# Patient Record
Sex: Male | Born: 1950 | Race: White | Hispanic: No | Smoking: Current every day smoker
Health system: Southern US, Community
[De-identification: ages and names within clinical notes are randomized; demographics above are authoritative.]

## PROBLEM LIST (undated history)

## (undated) DIAGNOSIS — R7881 Bacteremia: Secondary | ICD-10-CM

## (undated) DIAGNOSIS — G9340 Encephalopathy, unspecified: Secondary | ICD-10-CM

## (undated) DIAGNOSIS — Z720 Tobacco use: Secondary | ICD-10-CM

## (undated) DIAGNOSIS — S069XAA Unspecified intracranial injury with loss of consciousness status unknown, initial encounter: Secondary | ICD-10-CM

## (undated) DIAGNOSIS — S069X9A Unspecified intracranial injury with loss of consciousness of unspecified duration, initial encounter: Secondary | ICD-10-CM

## (undated) DIAGNOSIS — Z8709 Personal history of other diseases of the respiratory system: Secondary | ICD-10-CM

## (undated) DIAGNOSIS — E876 Hypokalemia: Secondary | ICD-10-CM

## (undated) DIAGNOSIS — D649 Anemia, unspecified: Secondary | ICD-10-CM

## (undated) DIAGNOSIS — F1010 Alcohol abuse, uncomplicated: Secondary | ICD-10-CM

## (undated) DIAGNOSIS — I10 Essential (primary) hypertension: Secondary | ICD-10-CM

## (undated) DIAGNOSIS — R55 Syncope and collapse: Secondary | ICD-10-CM

## (undated) HISTORY — PX: FRACTURE SURGERY: SHX138

## (undated) HISTORY — DX: Syncope and collapse: R55

## (undated) HISTORY — DX: Encephalopathy, unspecified: G93.40

## (undated) HISTORY — PX: APPENDECTOMY: SHX54

## (undated) HISTORY — DX: Anemia, unspecified: D64.9

## (undated) HISTORY — DX: Alcohol abuse, uncomplicated: F10.10

## (undated) HISTORY — DX: Tobacco use: Z72.0

## (undated) HISTORY — DX: Personal history of other diseases of the respiratory system: Z87.09

## (undated) HISTORY — DX: Hypokalemia: E87.6

---

## 2012-11-23 ENCOUNTER — Other Ambulatory Visit: Payer: Self-pay | Admitting: Gastroenterology

## 2012-11-23 ENCOUNTER — Ambulatory Visit
Admission: RE | Admit: 2012-11-23 | Discharge: 2012-11-23 | Disposition: A | Discharge status: Home / Self Care | Payer: Self-pay | Source: Ambulatory Visit | Attending: Gastroenterology | Admitting: Gastroenterology

## 2012-11-23 DIAGNOSIS — R109 Unspecified abdominal pain: Secondary | ICD-10-CM

## 2012-11-23 DIAGNOSIS — R112 Nausea with vomiting, unspecified: Secondary | ICD-10-CM

## 2012-11-23 MED ORDER — IOHEXOL 300 MG/ML  SOLN
125.0000 mL | Freq: Once | INTRAMUSCULAR | Status: AC | PRN
  Administered 2012-11-23: 125 mL via INTRAVENOUS

## 2013-01-13 ENCOUNTER — Encounter (HOSPITAL_COMMUNITY): Payer: Self-pay | Admitting: *Deleted

## 2013-01-13 ENCOUNTER — Emergency Department (HOSPITAL_COMMUNITY)
Admission: EM | Admit: 2013-01-13 | Discharge: 2013-01-13 | Disposition: A | Discharge status: Home / Self Care | Payer: Managed Care, Other (non HMO) | Attending: Emergency Medicine | Admitting: Emergency Medicine

## 2013-01-13 DIAGNOSIS — E876 Hypokalemia: Secondary | ICD-10-CM | POA: Insufficient documentation

## 2013-01-13 DIAGNOSIS — Z87820 Personal history of traumatic brain injury: Secondary | ICD-10-CM | POA: Insufficient documentation

## 2013-01-13 DIAGNOSIS — R61 Generalized hyperhidrosis: Secondary | ICD-10-CM | POA: Insufficient documentation

## 2013-01-13 DIAGNOSIS — R110 Nausea: Secondary | ICD-10-CM | POA: Insufficient documentation

## 2013-01-13 DIAGNOSIS — F17200 Nicotine dependence, unspecified, uncomplicated: Secondary | ICD-10-CM | POA: Insufficient documentation

## 2013-01-13 DIAGNOSIS — I10 Essential (primary) hypertension: Secondary | ICD-10-CM | POA: Insufficient documentation

## 2013-01-13 DIAGNOSIS — E860 Dehydration: Secondary | ICD-10-CM | POA: Insufficient documentation

## 2013-01-13 DIAGNOSIS — R55 Syncope and collapse: Secondary | ICD-10-CM | POA: Insufficient documentation

## 2013-01-13 DIAGNOSIS — Z880 Allergy status to penicillin: Secondary | ICD-10-CM | POA: Insufficient documentation

## 2013-01-13 HISTORY — DX: Essential (primary) hypertension: I10

## 2013-01-13 HISTORY — DX: Unspecified intracranial injury with loss of consciousness status unknown, initial encounter: S06.9XAA

## 2013-01-13 HISTORY — DX: Unspecified intracranial injury with loss of consciousness of unspecified duration, initial encounter: S06.9X9A

## 2013-01-13 LAB — COMPREHENSIVE METABOLIC PANEL
ALT: 10 U/L (ref 0–53)
AST: 15 U/L (ref 0–37)
Albumin: 4.1 g/dL (ref 3.5–5.2)
Calcium: 8.9 mg/dL (ref 8.4–10.5)
Sodium: 133 mEq/L — ABNORMAL LOW (ref 135–145)
Total Protein: 7.1 g/dL (ref 6.0–8.3)

## 2013-01-13 LAB — CBC WITH DIFFERENTIAL/PLATELET
Basophils Absolute: 0 10*3/uL (ref 0.0–0.1)
Basophils Relative: 0 % (ref 0–1)
Eosinophils Absolute: 0.1 10*3/uL (ref 0.0–0.7)
Eosinophils Relative: 1 % (ref 0–5)
Lymphocytes Relative: 15 % (ref 12–46)
MCV: 87.3 fL (ref 78.0–100.0)
Platelets: 228 10*3/uL (ref 150–400)
RDW: 12 % (ref 11.5–15.5)
WBC: 10.4 10*3/uL (ref 4.0–10.5)

## 2013-01-13 LAB — URINALYSIS, ROUTINE W REFLEX MICROSCOPIC
Bilirubin Urine: NEGATIVE
Hgb urine dipstick: NEGATIVE
Specific Gravity, Urine: 1.006 (ref 1.005–1.030)
pH: 7 (ref 5.0–8.0)

## 2013-01-13 LAB — MAGNESIUM: Magnesium: 1.7 mg/dL (ref 1.5–2.5)

## 2013-01-13 MED ORDER — SODIUM CHLORIDE 0.9 % IV BOLUS (SEPSIS)
1000.0000 mL | Freq: Once | INTRAVENOUS | Status: AC
  Administered 2013-01-13: 1000 mL via INTRAVENOUS

## 2013-01-13 MED ORDER — POTASSIUM CHLORIDE 10 MEQ/100ML IV SOLN
10.0000 meq | INTRAVENOUS | Status: AC
  Administered 2013-01-13 (×2): 10 meq via INTRAVENOUS
  Filled 2013-01-13 (×2): qty 100

## 2013-01-13 MED ORDER — PROMETHAZINE HCL 25 MG PO TABS: 25.0000 mg | ORAL_TABLET | Freq: Four times a day (QID) | ORAL | Status: DC | PRN

## 2013-01-13 MED ORDER — POTASSIUM CHLORIDE CRYS ER 20 MEQ PO TBCR
60.0000 meq | EXTENDED_RELEASE_TABLET | Freq: Once | ORAL | Status: AC
  Administered 2013-01-13: 60 meq via ORAL
  Filled 2013-01-13: qty 3

## 2013-01-13 NOTE — ED Notes (Signed)
Per EMS report pt was in line  in [employer] when he felt lightheaded. 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

## 2013-01-13 NOTE — ED Notes (Signed)
UEA:[id number]> Expected date:<BR> Expected time:<BR> Means of arrival:<BR> Comments:<BR> 62 y/o M near syncopal

## 2013-01-13 NOTE — ED Provider Notes (Signed)
History     CSN: [id number]  Arrival date & time 01/13/13  1330   First MD Initiated Contact with Patient 01/13/13 1342      Chief Complaint  Patient presents with  . 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  He states that this was acute in onset, intermittent, has never happened before and was associated with diaphoresis, nausea and "going pale" and resolved spontaneously - family member reports he was near syncopal, didn't pass out but had acute altered MS that was short lived and had no seizure activity or urinary incontinence.    He denies CP, SOB, headache, seizures.  The history is provided by the patient.    Past Medical History  Diagnosis Date  . TBI (traumatic brain injury)   . Hypertension     Past Surgical History  Procedure Laterality Date  . Fracture surgery    . Appendectomy      History reviewed. No pertinent family history.  History  Substance Use Topics  . Smoking status: Current Every Day Smoker -- 2.00 packs/day  . Smokeless tobacco: Not on file  . Alcohol Use: Yes      Review of Systems  All other systems reviewed and are negative.    Allergies  Effexor and Amoxicillin  Home Medications  No current outpatient prescriptions on file.  BP 131/87  Pulse 82  Temp(Src) 98.3 F (36.8 C) (Oral)  Resp 20  SpO2 96%  Physical Exam  Nursing note and vitals reviewed. Constitutional: He appears well-developed and well-nourished. No distress.  HENT:  Head: Normocephalic and atraumatic.  Mouth/Throat: No oropharyngeal exudate.  MM dry  Eyes: Conjunctivae and EOM are normal. Pupils are equal, round, and reactive to light. Right eye exhibits no discharge. Left eye exhibits no discharge. No scleral icterus.   Neck: Normal range of motion. Neck supple. No JVD present. No thyromegaly present.  Cardiovascular: Normal rate, regular rhythm, normal heart sounds and intact distal pulses.  Exam reveals no gallop and no friction rub.   No murmur heard. Pulmonary/Chest: Effort normal and breath sounds normal. No respiratory distress. He has no wheezes. He has no rales.  Abdominal: Soft. Bowel sounds are normal. He exhibits no distension and no mass. There is no tenderness.  Musculoskeletal: Normal range of motion. He exhibits no edema and no tenderness.  Lymphadenopathy:    He has no cervical adenopathy.  Neurological: He is alert. Coordination normal.  Skin: Skin is warm and dry. No rash noted. No erythema.  Psychiatric: He has a normal mood and affect. His behavior is normal.    ED Course  Procedures (including critical care time)  Labs Reviewed  GLUCOSE, CAPILLARY - Abnormal; Notable for the following:    Glucose-Capillary 123 (*)    All other components within normal limits  CBC WITH DIFFERENTIAL  COMPREHENSIVE METABOLIC PANEL  URINALYSIS, ROUTINE W REFLEX MICROSCOPIC  MAGNESIUM   No results found.   No diagnosis found.    MDM  No focal findings to suggest cardiac or neurologic source of pt's near syncope - he has no heart disease and has not been hypotensive or febrile.  ECG without acute findings.  ED ECG REPORT  I personally interpreted this EKG   Date: 01/13/2013   Rate: 78  Rhythm: normal sinus rhythm  QRS Axis: left  Intervals: normal  ST/T Wave abnormalities: nonspecific T wave changes  Conduction Disutrbances:nonspecific intraventricular conduction delay  Narrative Interpretation:   Old EKG Reviewed: none available     Cahnge of shift - care signed out to Dr. Renold Genta, MD 01/13/13 [phone]

## 2013-01-13 NOTE — ED Notes (Signed)
Pt again reminded we need urine sample, pt is stating that he will try in a little bit; pt refused in and out cath

## 2013-01-13 NOTE — ED Notes (Signed)
Pt's sister reports pt has hx of 2 TBI that have left pt with decreased cognition. Sister reports pt lives in [place] independent living. Per sister pt had frequent falls and hospital admissions in the past due to same.

## 2017-01-13 DIAGNOSIS — R7881 Bacteremia: Secondary | ICD-10-CM

## 2017-01-13 HISTORY — DX: Bacteremia: R78.81

## 2017-01-19 ENCOUNTER — Encounter: Payer: Self-pay | Admitting: Emergency Medicine

## 2017-01-19 ENCOUNTER — Inpatient Hospital Stay
Admission: EM | Admit: 2017-01-19 | Discharge: 2017-01-24 | DRG: 871 | Disposition: A | Discharge status: Skilled Nursing Facility | Payer: Medicare Other | Attending: Internal Medicine | Admitting: Internal Medicine

## 2017-01-19 ENCOUNTER — Emergency Department: Payer: Medicare Other

## 2017-01-19 DIAGNOSIS — Z79899 Other long term (current) drug therapy: Secondary | ICD-10-CM | POA: Diagnosis not present

## 2017-01-19 DIAGNOSIS — F329 Major depressive disorder, single episode, unspecified: Secondary | ICD-10-CM | POA: Diagnosis present

## 2017-01-19 DIAGNOSIS — R197 Diarrhea, unspecified: Secondary | ICD-10-CM | POA: Diagnosis present

## 2017-01-19 DIAGNOSIS — G9341 Metabolic encephalopathy: Secondary | ICD-10-CM | POA: Diagnosis present

## 2017-01-19 DIAGNOSIS — I248 Other forms of acute ischemic heart disease: Secondary | ICD-10-CM | POA: Diagnosis present

## 2017-01-19 DIAGNOSIS — I10 Essential (primary) hypertension: Secondary | ICD-10-CM | POA: Diagnosis present

## 2017-01-19 DIAGNOSIS — N179 Acute kidney failure, unspecified: Secondary | ICD-10-CM

## 2017-01-19 DIAGNOSIS — A419 Sepsis, unspecified organism: Principal | ICD-10-CM | POA: Diagnosis present

## 2017-01-19 DIAGNOSIS — F17210 Nicotine dependence, cigarettes, uncomplicated: Secondary | ICD-10-CM | POA: Diagnosis present

## 2017-01-19 DIAGNOSIS — R4182 Altered mental status, unspecified: Secondary | ICD-10-CM

## 2017-01-19 DIAGNOSIS — E876 Hypokalemia: Secondary | ICD-10-CM | POA: Diagnosis present

## 2017-01-19 DIAGNOSIS — J180 Bronchopneumonia, unspecified organism: Secondary | ICD-10-CM | POA: Diagnosis present

## 2017-01-19 DIAGNOSIS — J9601 Acute respiratory failure with hypoxia: Secondary | ICD-10-CM | POA: Diagnosis present

## 2017-01-19 DIAGNOSIS — R109 Unspecified abdominal pain: Secondary | ICD-10-CM

## 2017-01-19 DIAGNOSIS — G9340 Encephalopathy, unspecified: Secondary | ICD-10-CM | POA: Diagnosis present

## 2017-01-19 DIAGNOSIS — Z87820 Personal history of traumatic brain injury: Secondary | ICD-10-CM | POA: Diagnosis not present

## 2017-01-19 DIAGNOSIS — K219 Gastro-esophageal reflux disease without esophagitis: Secondary | ICD-10-CM | POA: Diagnosis present

## 2017-01-19 DIAGNOSIS — E860 Dehydration: Secondary | ICD-10-CM | POA: Diagnosis present

## 2017-01-19 DIAGNOSIS — R6520 Severe sepsis without septic shock: Secondary | ICD-10-CM | POA: Diagnosis present

## 2017-01-19 DIAGNOSIS — E872 Acidosis: Secondary | ICD-10-CM | POA: Diagnosis present

## 2017-01-19 DIAGNOSIS — K8020 Calculus of gallbladder without cholecystitis without obstruction: Secondary | ICD-10-CM | POA: Diagnosis present

## 2017-01-19 DIAGNOSIS — M109 Gout, unspecified: Secondary | ICD-10-CM | POA: Diagnosis present

## 2017-01-19 DIAGNOSIS — R7989 Other specified abnormal findings of blood chemistry: Secondary | ICD-10-CM

## 2017-01-19 DIAGNOSIS — I2489 Other forms of acute ischemic heart disease: Secondary | ICD-10-CM

## 2017-01-19 LAB — CBC WITH DIFFERENTIAL/PLATELET
BASOS ABS: 0 10*3/uL (ref 0–0.1)
Basophils Relative: 0 %
Eosinophils Absolute: 0 10*3/uL (ref 0–0.7)
Eosinophils Relative: 0 %
HEMATOCRIT: 49.1 % (ref 40.0–52.0)
HEMOGLOBIN: 16.8 g/dL (ref 13.0–18.0)
LYMPHS PCT: 4 %
Lymphs Abs: 0.8 10*3/uL — ABNORMAL LOW (ref 1.0–3.6)
MCH: 31.6 pg (ref 26.0–34.0)
MCHC: 34.2 g/dL (ref 32.0–36.0)
MCV: 92.6 fL (ref 80.0–100.0)
Monocytes Absolute: 1.8 10*3/uL — ABNORMAL HIGH (ref 0.2–1.0)
Monocytes Relative: 8 %
NEUTROS ABS: 21.3 10*3/uL — AB (ref 1.4–6.5)
NEUTROS PCT: 88 %
PLATELETS: 264 10*3/uL (ref 150–440)
RBC: 5.3 MIL/uL (ref 4.40–5.90)
RDW: 13.3 % (ref 11.5–14.5)
WBC: 24 10*3/uL — AB (ref 3.8–10.6)

## 2017-01-19 LAB — COMPREHENSIVE METABOLIC PANEL
ALBUMIN: 4.6 g/dL (ref 3.5–5.0)
ALT: 25 U/L (ref 17–63)
ANION GAP: 19 — AB (ref 5–15)
AST: 126 U/L — ABNORMAL HIGH (ref 15–41)
Alkaline Phosphatase: 88 U/L (ref 38–126)
BUN: 28 mg/dL — ABNORMAL HIGH (ref 6–20)
CHLORIDE: 98 mmol/L — AB (ref 101–111)
CO2: 23 mmol/L (ref 22–32)
Calcium: 9.5 mg/dL (ref 8.9–10.3)
Creatinine, Ser: 3.63 mg/dL — ABNORMAL HIGH (ref 0.61–1.24)
GFR calc non Af Amer: 16 mL/min — ABNORMAL LOW (ref >60)
GFR, EST AFRICAN AMERICAN: 18 mL/min — AB (ref >60)
GLUCOSE: 179 mg/dL — AB (ref 65–99)
Potassium: 2.6 mmol/L — CL (ref 3.5–5.1)
SODIUM: 140 mmol/L (ref 135–145)
Total Bilirubin: 1.8 mg/dL — ABNORMAL HIGH (ref 0.3–1.2)
Total Protein: 8.2 g/dL — ABNORMAL HIGH (ref 6.5–8.1)

## 2017-01-19 LAB — URINALYSIS, ROUTINE W REFLEX MICROSCOPIC
Bacteria, UA: NONE SEEN
GLUCOSE, UA: NEGATIVE mg/dL
Ketones, ur: 5 mg/dL — AB
Leukocytes, UA: NEGATIVE
NITRITE: NEGATIVE
Protein, ur: 100 mg/dL — AB
SPECIFIC GRAVITY, URINE: 1.03 (ref 1.005–1.030)
pH: 5 (ref 5.0–8.0)

## 2017-01-19 LAB — BLOOD GAS, ARTERIAL
Acid-base deficit: 2 mmol/L (ref 0.0–2.0)
Bicarbonate: 22.3 mmol/L (ref 20.0–28.0)
FIO2: 1
O2 Saturation: 98.1 %
Patient temperature: 37
pCO2 arterial: 36 mmHg (ref 32.0–48.0)
pH, Arterial: 7.4 (ref 7.350–7.450)
pO2, Arterial: 106 mmHg (ref 83.0–108.0)

## 2017-01-19 LAB — MRSA PCR SCREENING: MRSA by PCR: NEGATIVE

## 2017-01-19 LAB — LACTIC ACID, PLASMA
LACTIC ACID, VENOUS: 3.5 mmol/L — AB (ref 0.5–1.9)
LACTIC ACID, VENOUS: 1.5 mmol/L (ref 0.5–1.9)
Lactic Acid, Venous: 5.5 mmol/L (ref 0.5–1.9)
Lactic Acid, Venous: 2.4 mmol/L (ref 0.5–1.9)

## 2017-01-19 LAB — AMMONIA: AMMONIA: 18 umol/L (ref 9–35)

## 2017-01-19 LAB — MAGNESIUM: Magnesium: 2.4 mg/dL (ref 1.7–2.4)

## 2017-01-19 LAB — PROTIME-INR
INR: 1.15
Prothrombin Time: 14.8 seconds (ref 11.4–15.2)

## 2017-01-19 LAB — PROCALCITONIN: PROCALCITONIN: 3.27 ng/mL

## 2017-01-19 LAB — TROPONIN I: TROPONIN I: 0.05 ng/mL — AB (ref <0.03)

## 2017-01-19 LAB — BRAIN NATRIURETIC PEPTIDE: B NATRIURETIC PEPTIDE 5: 28 pg/mL (ref 0.0–100.0)

## 2017-01-19 LAB — LIPASE, BLOOD: Lipase: 53 U/L — ABNORMAL HIGH (ref 11–51)

## 2017-01-19 LAB — GLUCOSE, CAPILLARY: GLUCOSE-CAPILLARY: 156 mg/dL — AB (ref 65–99)

## 2017-01-19 LAB — POTASSIUM: Potassium: 2.9 mmol/L — ABNORMAL LOW (ref 3.5–5.1)

## 2017-01-19 MED ORDER — POTASSIUM CHLORIDE 20 MEQ PO PACK
40.0000 meq | PACK | ORAL | Status: AC
  Administered 2017-01-19: 40 meq via ORAL
  Filled 2017-01-19: qty 2

## 2017-01-19 MED ORDER — ONDANSETRON HCL 4 MG/2ML IJ SOLN
4.0000 mg | Freq: Four times a day (QID) | INTRAMUSCULAR | Status: DC | PRN
  Administered 2017-01-20 – 2017-01-23 (×8): 4 mg via INTRAVENOUS
  Filled 2017-01-19 (×10): qty 2

## 2017-01-19 MED ORDER — ACETAMINOPHEN 650 MG RE SUPP: 650.0000 mg | Freq: Four times a day (QID) | RECTAL | Status: DC | PRN

## 2017-01-19 MED ORDER — ORAL CARE MOUTH RINSE
15.0000 mL | Freq: Two times a day (BID) | OROMUCOSAL | Status: DC
  Administered 2017-01-19 – 2017-01-23 (×3): 15 mL via OROMUCOSAL

## 2017-01-19 MED ORDER — DEXTROSE 5 % IV SOLN
2.0000 g | Freq: Once | INTRAVENOUS | Status: AC
  Administered 2017-01-19: 2 g via INTRAVENOUS
  Filled 2017-01-19: qty 2

## 2017-01-19 MED ORDER — POTASSIUM CHLORIDE IN NACL 20-0.9 MEQ/L-% IV SOLN
INTRAVENOUS | Status: AC
  Administered 2017-01-19 – 2017-01-20 (×3): via INTRAVENOUS
  Filled 2017-01-19 (×4): qty 1000

## 2017-01-19 MED ORDER — ENOXAPARIN SODIUM 30 MG/0.3ML ~~LOC~~ SOLN
30.0000 mg | SUBCUTANEOUS | Status: DC
  Administered 2017-01-19: 30 mg via SUBCUTANEOUS
  Filled 2017-01-19 (×2): qty 0.3

## 2017-01-19 MED ORDER — ONDANSETRON HCL 4 MG PO TABS: 4.0000 mg | ORAL_TABLET | Freq: Four times a day (QID) | ORAL | Status: DC | PRN

## 2017-01-19 MED ORDER — DEXTROSE 5 % IV SOLN
1.0000 g | INTRAVENOUS | Status: DC
  Administered 2017-01-20 – 2017-01-21 (×2): 1 g via INTRAVENOUS
  Filled 2017-01-19 (×2): qty 1

## 2017-01-19 MED ORDER — VANCOMYCIN HCL 10 G IV SOLR
1250.0000 mg | INTRAVENOUS | Status: DC
  Administered 2017-01-19: 1250 mg via INTRAVENOUS
  Filled 2017-01-19: qty 1250

## 2017-01-19 MED ORDER — SODIUM CHLORIDE 0.9 % IV BOLUS (SEPSIS)
1000.0000 mL | INTRAVENOUS | Status: AC
  Administered 2017-01-19: 1000 mL via INTRAVENOUS

## 2017-01-19 MED ORDER — ACETAMINOPHEN 325 MG PO TABS
650.0000 mg | ORAL_TABLET | Freq: Four times a day (QID) | ORAL | Status: DC | PRN
  Administered 2017-01-23: 650 mg via ORAL
  Filled 2017-01-19: qty 2

## 2017-01-19 MED ORDER — VANCOMYCIN HCL IN DEXTROSE 1-5 GM/200ML-% IV SOLN
1000.0000 mg | Freq: Once | INTRAVENOUS | Status: AC
  Administered 2017-01-19: 1000 mg via INTRAVENOUS
  Filled 2017-01-19: qty 200

## 2017-01-19 MED ORDER — SODIUM CHLORIDE 0.9 % IV BOLUS (SEPSIS)
2000.0000 mL | Freq: Once | INTRAVENOUS | Status: AC
  Administered 2017-01-19: 2000 mL via INTRAVENOUS

## 2017-01-19 MED ORDER — POTASSIUM CHLORIDE 10 MEQ/100ML IV SOLN
10.0000 meq | INTRAVENOUS | Status: AC
  Administered 2017-01-19 (×3): 10 meq via INTRAVENOUS
  Filled 2017-01-19 (×3): qty 100

## 2017-01-19 NOTE — ED Triage Notes (Signed)
Pt to ED via EMS from [facility], family called ems states pt was diagnosed with hypocalcemia last week and hasn't been taking supplements. Pt was found diaphoretic and tachycardic. Pt has hx of TBIs and per family more altered than normal.

## 2017-01-19 NOTE — Progress Notes (Signed)
Spoke with ED nurse Amy and asked for height and weight to be entered into Epic. She agrees and says "when I get a minute I will." Height and weight are needed to estimate creatinine clearance and for vancomycin dosing.    Kauri Garson A. [place], [place] BCPS Clinical Pharmacist 01/19/2017 12:54

## 2017-01-19 NOTE — ED Notes (Signed)
Pt sleeping, respirations even and non-labored. Pt rouses easily. Daughter at bedside.

## 2017-01-19 NOTE — Progress Notes (Signed)
First cefepime dose tubed to ED at 13:06.

## 2017-01-19 NOTE — ED Provider Notes (Signed)
[facility] Emergency Department Provider Note  ____________________________________________   First MD Initiated Contact with Patient 01/19/17 1249     (approximate)  I have reviewed the triage vital signs and the nursing notes.   HISTORY  Chief Complaint Dehydration  Level 5 caveat:  history/ROS limited by acute/critical illness and chronic TBI  HPI Jacob Villanueva is a 66 y.o. male with a history of traumatic brain injury who lives in the independent living care at [facility].  He presents by EMS for decreased level of responsiveness and general ill appearance.  His sister and niece, who share healthcare powers of attorney, are both present.  They state that the patient claims he has been feeling sick all week but without any specific symptoms, and as a result of his TBI and is very difficult to get a history from him.  His symptoms seem to have gradually worsened and then become much more severe over the last 24 hours.  This morning he did not show up for morning coffee or breakfast at the facility and his family went to check on him and found him on the ground, awake but less responsive than usual, sweaty and tachycardic.  Reportedly altered from his baseline with increased level of somnolence although he will respond to questions.  He reports that he has had diarrhea all week.  He denies chest pain and shortness of breath although he has tachypnea lying at rest with a respiratory rate of about 25.  He is also hypoxemic with an SPO2 of 86% on 4 L by nasal cannula.   Past Medical History:  Diagnosis Date  . Hypertension   . TBI (traumatic brain injury) (HCC)     There are no active problems to display for this patient.   Past Surgical History:  Procedure Laterality Date  . APPENDECTOMY    . FRACTURE SURGERY      Prior to Admission medications   Medication Sig Start Date End Date Taking? Authorizing Provider  amLODipine (NORVASC) 10 MG tablet Take 10 mg  by mouth daily.    [provider]  calcium carbonate (TUMS EX) 750 MG chewable tablet Chew 1 tablet by mouth 2 (two) times daily.    [provider]  Cholecalciferol (VITAMIN D3) 2000 UNITS TABS Take 1 tablet by mouth 2 (two) times daily.    [provider]  esomeprazole (NEXIUM) 40 MG capsule Take 40 mg by mouth daily before breakfast.    [provider]  gabapentin (NEURONTIN) 300 MG capsule Take 300 mg by mouth 3 (three) times daily.    [provider]  hydrochlorothiazide (HYDRODIURIL) 25 MG tablet Take 25 mg by mouth daily.    [provider]  loratadine (CLARITIN) 10 MG tablet Take 10 mg by mouth daily.    [provider]  ondansetron (ZOFRAN) 8 MG tablet Take 8 mg by mouth daily.    [provider]  promethazine (PHENERGAN) 25 MG tablet Take 1 tablet (25 mg total) by mouth every 6 (six) hours as needed for nausea. 01/13/13   Eber Hong, MD  ranitidine (ZANTAC) 150 MG tablet Take 150 mg by mouth 2 (two) times daily.    [provider]  simethicone (MYLICON) 125 MG chewable tablet Chew 125 mg by mouth 2 (two) times daily.    [provider]    Allergies Effexor [venlafaxine hcl] and Amoxicillin  No family history on file.  Social History Social History  Substance Use Topics  .  Smoking status: Current Every Day Smoker    Packs/day: 2.00  . Smokeless tobacco: Never Used  . Alcohol use Yes    Review of Systems  Level 5 caveat:  history/ROS limited by acute/critical illness and chronic TBI  ____________________________________________   PHYSICAL EXAM:  VITAL SIGNS: ED Triage Vitals  Enc Vitals Group     BP 01/19/17 1235 99/75     Pulse Rate 01/19/17 1235 (!) 124     Resp 01/19/17 1235 20     Temp 01/19/17 1235 99.3 F (37.4 C)     Temp Source 01/19/17 1235 Oral     SpO2 01/19/17 1235 (!) 88 %     Weight 01/19/17 1258 90.3 kg (199 lb)     Height 01/19/17 1258 1.88 m (6\' 2" )      Head Circumference --      Peak Flow --      Pain Score --      Pain Loc --      Pain Edu? --      Excl. in GC? --     Constitutional: The patient appears acutely ill.  Somnolent but responds to questions, his name, and to light touch Eyes: Conjunctivae are normal.  Head: Atraumatic. Nose: No congestion/rhinnorhea. Mouth/Throat: Mucous membranes are moist. Neck: No stridor.  No meningeal signs.   Cardiovascular: Tachycardia, regular rhythm. Good peripheral circulation. Grossly normal heart sounds. Respiratory: Normal respiratory effort but with increased rate.  No retractions.  Coarse breath sounds are present bilaterally. Gastrointestinal: Soft with mild diffuse tenderness throughout the abdomen without any specific focal tenderness Musculoskeletal: No lower extremity tenderness nor edema. No gross deformities of extremities. Neurologic:  Normal speech and language. No gross focal neurologic deficits are appreciated.  Skin:  Skin is warm, dry and intact. No rash noted.   ____________________________________________   LABS (all labs ordered are listed, but only abnormal results are displayed)  Labs Reviewed  CULTURE, BLOOD (ROUTINE X 2)  CULTURE, BLOOD (ROUTINE X 2)  URINE CULTURE  COMPREHENSIVE METABOLIC PANEL  LACTIC ACID, PLASMA  LACTIC ACID, PLASMA  CBC WITH DIFFERENTIAL/PLATELET  PROTIME-INR  LIPASE, BLOOD  BRAIN NATRIURETIC PEPTIDE  TROPONIN I  PROCALCITONIN  URINALYSIS, ROUTINE W REFLEX MICROSCOPIC   ____________________________________________  EKG  ED ECG REPORT I, Shada Nienaber, the attending physician, personally viewed and interpreted this ECG.  Date: 01/19/2017 EKG Time: 12:37 Rate: 124 Rhythm: Sinus tachycardia QRS Axis: normal Intervals: Left anterior fascicular block ST/T Wave abnormalities: Non-specific ST segment / T-wave changes, but no evidence of acute ischemia. Conduction Disturbances: none Narrative Interpretation: no evidence of acute  ischemia  ____________________________________________  RADIOLOGY   No results found.  ____________________________________________   PROCEDURES  Critical Care performed: Yes, see critical care procedure note(s)   Procedure(s) performed:   .Critical Care Performed by: Loleta Rose Authorized by: Loleta Rose   Critical care provider statement:    Critical care time (minutes):  60   Critical care time was exclusive of:  Separately billable procedures and treating other patients   Critical care was necessary to treat or prevent imminent or life-threatening deterioration of the following conditions:  Sepsis   Critical care was time spent personally by me on the following activities:  Development of treatment plan with patient or surrogate, discussions with consultants, evaluation of patient's response to treatment, examination of patient, obtaining history from patient or surrogate, ordering and performing treatments and interventions, ordering and review of laboratory studies, ordering and review of radiographic studies, pulse oximetry, re-evaluation  of patient's condition and review of old charts      ____________________________________________   INITIAL IMPRESSION / ASSESSMENT AND PLAN / ED COURSE  Pertinent labs & imaging results that were available during my care of the patient were reviewed by me and considered in my medical decision making (see chart for details).  The patient meets criteria for sepsis based on hypoxemia and tachycardia, and he also has tachypnea and altered mental status from baseline.  based on his tachypnea and hypoxemia I suspect he may have pneumonia although the only symptom the patient reports from this last weekis diarrhea.  He has only mild diffuse tenderness throughout his abdomen without any focal tenderness.  I will obtain imaging is needed but will wait for the initial code sepsis workup.  The secretary was informed to call the code sepsis  and I ordered cefepime and vancomycin for broad coverage given his reported penicillin allergy but the need for broad-spectrum coverage including intra-abdominal pathogens.   Clinical Course as of Jan 19 1510  Thu Jan 19, 2017  1326 White count is 24 and the patient's lactate is greater than 5.  This constitutes severe sepsis and I am ordering the target of 30 mL/kg of IV fluids.  First liter is already going.  [CF]  1502 Questionable bronchopneumonia, no acute intra-abdominal pathology.  I will admit for further management.  [CF]    Clinical Course User Index [CF] Loleta RoseForbach, Elspeth Blucher, MD    ____________________________________________  FINAL CLINICAL IMPRESSION(S) / ED DIAGNOSES  Final diagnoses:  None     MEDICATIONS GIVEN DURING THIS VISIT:  Medications  ceFEPIme (MAXIPIME) 2 g in dextrose 5 % 50 mL IVPB (not administered)  vancomycin (VANCOCIN) IVPB 1000 mg/200 mL premix (not administered)     NEW OUTPATIENT MEDICATIONS STARTED DURING THIS VISIT:  New Prescriptions   No medications on file    Modified Medications   No medications on file    Discontinued Medications   No medications on file     Note:  This document was prepared using Dragon voice recognition software and may include unintentional dictation errors.    Loleta RoseForbach, Laquesha Holcomb, MD 01/19/17 [phone]

## 2017-01-19 NOTE — ED Notes (Addendum)
Pt SpO2 on 4L [place] 88%. Pt placed on NRB. SpO2 increased to 92%. Dr. York CeriseForbach at bedside.

## 2017-01-19 NOTE — ED Notes (Signed)
Date and time results received: 01/19/17 1:59 PM  (use smartphrase ".now" to insert current time)  Test: potassium Critical Value: 2.6  Name of Provider Notified: Dr. York CeriseForbach  Orders Received? Or Actions Taken?: Orders Received - See Orders for details

## 2017-01-19 NOTE — H&P (Signed)
[facility] [place] at [facility]   PATIENT NAME: Jacob Villanueva    MR#:  [id number]  DATE OF BIRTH:  06/04/1951  DATE OF ADMISSION:  01/19/2017  PRIMARY CARE PHYSICIAN: Patient, No Pcp Per   REQUESTING/REFERRING PHYSICIAN: Loleta RoseForbach, Cory, MD  CHIEF COMPLAINT:  Dehydration, diarrhea  HISTORY OF PRESENT ILLNESS:  Jacob Villanueva  is a 66 y.o. male with a known history of Traumatic brain injury who [facility] is presenting to the ED via EMS for being unresponsive. Patient was reporting being sick with a decreased by mouth intake and having diarrhea. Patient was hypoxemic with pulse ox 86% on 4 L and eventually he was placed on nonrebreather and he was on 10 L of oxygen. CT chest has revealed bilateral lower lobe consolidation versus atelectasis. White blood cell count is elevated. Patient is started on IV cefepime and vancomycin following cultures and hospitalist team is called to admit the patient. Patient was very lethargic during my examination though arousable and falling asleep. Niece at bedside  PAST MEDICAL HISTORY:   Past Medical History:  Diagnosis Date  . Hypertension   . TBI (traumatic brain injury) (HCC)     PAST SURGICAL HISTOIRY:   Past Surgical History:  Procedure Laterality Date  . APPENDECTOMY    . FRACTURE SURGERY      SOCIAL HISTORY:   Social History  Substance Use Topics  . Smoking status: Current Every Day Smoker    Packs/day: 2.00  . Smokeless tobacco: Never Used  . Alcohol use Yes    FAMILY HISTORY:  HTN none 7 his family according to the niece at bedside  DRUG ALLERGIES:   Allergies  Allergen Reactions  . Effexor [Venlafaxine Hcl]     DELUSIONAL   . Amoxicillin Rash    REVIEW OF SYSTEMS:  Review of system unobtainable as the patient is encephalopathic  MEDICATIONS AT HOME:   Prior to Admission medications   Medication Sig Start Date End Date Taking? Authorizing Provider   allopurinol (ZYLOPRIM) 100 MG tablet Take 100 mg by mouth daily. 01/12/17  Yes [provider]  amLODipine (NORVASC) 10 MG tablet Take 10 mg by mouth daily.   Yes [provider]  calcium carbonate (TUMS EX) 750 MG chewable tablet Chew 1 tablet by mouth 2 (two) times daily.   Yes [provider]  esomeprazole (NEXIUM) 40 MG capsule Take 40 mg by mouth 2 (two) times daily before a meal.    Yes [provider]  gabapentin (NEURONTIN) 300 MG capsule Take 300 mg by mouth 3 (three) times daily.   Yes [provider]  hydrochlorothiazide (HYDRODIURIL) 25 MG tablet Take 25 mg by mouth daily.   Yes [provider]  loratadine (CLARITIN) 10 MG tablet Take 10 mg by mouth 2 (two) times daily.    Yes [provider]  NON FORMULARY 1 Dose every 3 (three) months. Testa Pill. A testosterone pill that is implanted into side q 3 months   Yes [provider]  ranitidine (ZANTAC) 150 MG tablet Take 150 mg by mouth at bedtime.    Yes [provider]  sertraline (ZOLOFT) 50 MG tablet Take 50 mg by mouth every morning. 01/12/17  Yes [provider]  simethicone (MYLICON) 125 MG chewable tablet Chew 125 mg by mouth 2 (two) times daily.   Yes [provider]  promethazine (PHENERGAN) 25 MG tablet Take 1 tablet (25 mg total) by mouth every  6 (six) hours as needed for nausea. Patient not taking: Reported on 01/19/2017 01/13/13   Eber Hong, MD      VITAL SIGNS:  Blood pressure 105/68, pulse 82, temperature 98.3 F (36.8 C), temperature source Oral, resp. rate 17, height 6\' 4"  (1.93 m), weight 88.8 kg (195 lb 12.3 oz), SpO2 97 %.  PHYSICAL EXAMINATION:  GENERAL:  66 y.o.-year-old patient lying in the bed with no acute distress.  EYES: Pupils equal, round, reactive to light and accommodation. No scleral icterus.HEENT: Head atraumatic, normocephalic. Oropharynx and nasopharynx clear.  NECK:  Supple, no jugular venous  distention. No thyroid enlargement, no tenderness.  LUNGS: Diminished breath sounds bilaterally, positive crackles no wheezing, rales,rhonchi . No use of accessory muscles of respiration.  CARDIOVASCULAR: S1, S2 normal. No murmurs, rubs, or gallops.  ABDOMEN: Soft, nontender, nondistended. Bowel sounds present. No organomegaly or mass.  EXTREMITIES: No pedal edema, cyanosis, or clubbing.  NEUROLOGIC: Patient is encephalopathic  PSYCHIATRIC: The patient is very lethargic arousable but falling asleep SKIN: No obvious rash, lesion, or ulcer.   LABORATORY PANEL:   CBC  Recent Labs Lab 01/19/17 1239  WBC 24.0*  HGB 16.8  HCT 49.1  PLT 264   ------------------------------------------------------------------------------------------------------------------  Chemistries   Recent Labs Lab 01/19/17 1239  NA 140  K 2.6*  CL 98*  CO2 23  GLUCOSE 179*  BUN 28*  CREATININE 3.63*  CALCIUM 9.5  MG 2.4  AST 126*  ALT 25  ALKPHOS 88  BILITOT 1.8*   ------------------------------------------------------------------------------------------------------------------  Cardiac Enzymes  Recent Labs Lab 01/19/17 1239  TROPONINI 0.05*   ------------------------------------------------------------------------------------------------------------------  RADIOLOGY:  Ct Chest Wo Contrast  Result Date: 01/19/2017 CLINICAL DATA:  The diaphoretic and tachycardic. Abdominal pain and diarrhea. EXAM: CT CHEST, ABDOMEN AND PELVIS WITHOUT CONTRAST TECHNIQUE: Multidetector CT imaging of the chest, abdomen and pelvis was performed following the standard protocol without IV contrast. COMPARISON:  Abdomen and pelvis CT 11/23/2012 FINDINGS: CT CHEST FINDINGS Cardiovascular: The heart size is normal. No pericardial effusion. Coronary artery calcification is noted. Atherosclerotic calcification is noted in the wall of the thoracic aorta. Mediastinum/Nodes: No mediastinal lymphadenopathy. No evidence for gross  hilar lymphadenopathy although assessment is limited by the lack of intravenous contrast on today's study. The esophagus has normal imaging features. There is no axillary lymphadenopathy. Lungs/Pleura: Assessment lung parenchyma is degraded by breathing motion. There is bibasilar atelectasis. Bronchial wall thickening is most evident in the lower lobes and areas of small airway impaction are seen in the lower lobes bilaterally. Probable scarring in the right middle lobe and lingula slightly progressive since prior abdomen and pelvis CT. No pulmonary edema or pleural effusion. Musculoskeletal: Old left seventh rib fracture noted. CT ABDOMEN PELVIS FINDINGS Hepatobiliary: The liver shows diffusely decreased attenuation suggesting steatosis. Gallbladder is markedly distended. No intrahepatic or extrahepatic biliary dilation. Pancreas: Pancreas diffusely fatty replaced without focal mass lesion or dilatation of the main duct. Spleen: No splenomegaly. No focal mass lesion. Adrenals/Urinary Tract: No adrenal nodule or mass. Kidneys are unremarkable bilaterally. No evidence for hydroureter. The urinary bladder appears normal for the degree of distention. Stomach/Bowel: Stomach is nondistended. No gastric wall thickening. No evidence of outlet obstruction. Duodenum is normally positioned as is the ligament of Treitz. No small bowel wall thickening. No small bowel dilatation. Patient is status post right hemicolectomy. Diverticular changes are noted in the left colon without evidence of diverticulitis. Vascular/Lymphatic: There is abdominal aortic atherosclerosis without aneurysm. There is no gastrohepatic or hepatoduodenal ligament lymphadenopathy. No intraperitoneal or  retroperitoneal lymphadenopathy. No pelvic sidewall lymphadenopathy. Reproductive: The prostate gland and seminal vesicles have normal imaging features. Other: No intraperitoneal free fluid. Musculoskeletal: Bone windows reveal no worrisome lytic or  sclerotic osseous lesions. Stable appearance L1 superior endplate compression deformity. IMPRESSION: 1. Bilateral posterior lower lobe collapse/consolidation with bronchial wall thickening and small airway impaction. These changes may be related to atypical infection or bronchopneumonia, but in the appropriate clinical setting, aspiration could have the same imaging findings. 2. Hepatic steatosis. 3. Marked gallbladder distention without biliary dilatation. 4. Status post right hemicolectomy without evidence for bowel obstruction or bowel wall thickening. No intraperitoneal free fluid. 5. Stable appearance L1 superior endplate compression deformity. Electronically Signed   By: Kennith Center M.D.   On: 01/19/2017 14:51   Dg Chest Portable 1 View  Result Date: 01/19/2017 CLINICAL DATA:  Diaphoresis and tachycardia. Recently diagnosed with hypocalcemia. History of traumatic brain injury. Current smoker. EXAM: PORTABLE CHEST 1 VIEW COMPARISON:  None in PACs FINDINGS: The lungs are adequately inflated. There is no focal infiltrate. There is no pleural effusion. The heart and pulmonary vascularity are normal. The mediastinum is normal in width. The bony thorax exhibits no acute abnormality. IMPRESSION: There is no acute cardiopulmonary abnormality. Electronically Signed   By: David  [place] M.D.   On: 01/19/2017 13:41   Ct Renal Stone Study  Result Date: 01/19/2017 CLINICAL DATA:  The diaphoretic and tachycardic. Abdominal pain and diarrhea. EXAM: CT CHEST, ABDOMEN AND PELVIS WITHOUT CONTRAST TECHNIQUE: Multidetector CT imaging of the chest, abdomen and pelvis was performed following the standard protocol without IV contrast. COMPARISON:  Abdomen and pelvis CT 11/23/2012 FINDINGS: CT CHEST FINDINGS Cardiovascular: The heart size is normal. No pericardial effusion. Coronary artery calcification is noted. Atherosclerotic calcification is noted in the wall of the thoracic aorta. Mediastinum/Nodes: No mediastinal  lymphadenopathy. No evidence for gross hilar lymphadenopathy although assessment is limited by the lack of intravenous contrast on today's study. The esophagus has normal imaging features. There is no axillary lymphadenopathy. Lungs/Pleura: Assessment lung parenchyma is degraded by breathing motion. There is bibasilar atelectasis. Bronchial wall thickening is most evident in the lower lobes and areas of small airway impaction are seen in the lower lobes bilaterally. Probable scarring in the right middle lobe and lingula slightly progressive since prior abdomen and pelvis CT. No pulmonary edema or pleural effusion. Musculoskeletal: Old left seventh rib fracture noted. CT ABDOMEN PELVIS FINDINGS Hepatobiliary: The liver shows diffusely decreased attenuation suggesting steatosis. Gallbladder is markedly distended. No intrahepatic or extrahepatic biliary dilation. Pancreas: Pancreas diffusely fatty replaced without focal mass lesion or dilatation of the main duct. Spleen: No splenomegaly. No focal mass lesion. Adrenals/Urinary Tract: No adrenal nodule or mass. Kidneys are unremarkable bilaterally. No evidence for hydroureter. The urinary bladder appears normal for the degree of distention. Stomach/Bowel: Stomach is nondistended. No gastric wall thickening. No evidence of outlet obstruction. Duodenum is normally positioned as is the ligament of Treitz. No small bowel wall thickening. No small bowel dilatation. Patient is status post right hemicolectomy. Diverticular changes are noted in the left colon without evidence of diverticulitis. Vascular/Lymphatic: There is abdominal aortic atherosclerosis without aneurysm. There is no gastrohepatic or hepatoduodenal ligament lymphadenopathy. No intraperitoneal or retroperitoneal lymphadenopathy. No pelvic sidewall lymphadenopathy. Reproductive: The prostate gland and seminal vesicles have normal imaging features. Other: No intraperitoneal free fluid. Musculoskeletal: Bone  windows reveal no worrisome lytic or sclerotic osseous lesions. Stable appearance L1 superior endplate compression deformity. IMPRESSION: 1. Bilateral posterior lower lobe collapse/consolidation with bronchial wall thickening  and small airway impaction. These changes may be related to atypical infection or bronchopneumonia, but in the appropriate clinical setting, aspiration could have the same imaging findings. 2. Hepatic steatosis. 3. Marked gallbladder distention without biliary dilatation. 4. Status post right hemicolectomy without evidence for bowel obstruction or bowel wall thickening. No intraperitoneal free fluid. 5. Stable appearance L1 superior endplate compression deformity. Electronically Signed   By: Kennith Center M.D.   On: 01/19/2017 14:51    EKG:   Orders placed or performed during the hospital encounter of 01/19/17  . EKG 12-Lead  . EKG 12-Lead  . EKG 12-Lead  . EKG 12-Lead    IMPRESSION AND PLAN:   Samvel Zinn  is a 66 y.o. male with a known history of Traumatic brain injury who lives in independent living [facility] at [facility] is presenting to the ED via EMS for being unresponsive. Patient was reporting being sick with a decreased by mouth intake and having diarrhea. Patient was hypoxemic with pulse ox 86% on 4 L and eventually he was placed on nonrebreather and he was on 10 L of oxygen. CT chest has revealed bilateral lower lobe consolidation versus atelectasis. White blood cell count is elevated.  #Acute encephalopathy secondary to sepsis Admit to stepdown unit Neuro checks Broad-spectrum IV antibiotics after cultures Supportive treatment  #Severe sepsis-from aspiration pneumonia and diarrhea Admit to stepdown unit. Meets septic criteria with elevated lactic acid, pro-calcitonin, leukocytosis Blood cultures, urine cultures, sputum cultures are ordered Stool for C. difficile toxin Empiric IV antibiotics with cefepime and vancomycin Monitor lactic acid Aspiration  precautions Swallow evaluation Monitor lactic acid levels  #Acute hypoxic respiratory failure secondary to aspiration pneumonia Patient is on nonrebreather Provide breathing treatments and broad-spectrum IV antibiotics Pulmonology consulted discussed with Dr. Levada Schilling,  #Acute kidney injury 2/2 dehydration Hydrated with IV fluids Avoid nephrotoxins Renal Ultrasound Monitor intake and output  #Hypokalemia secondary to diarrhea and dehydration Replete and recheck. Magnesium is in the normal range  #Elevated troponin could be from demand ischemia from sepsis and dehydration Cycle cardiac biomarkers and monitor on telemetry  #History of traumatic brain injury Patient communicates at his baseline according to the niece at bedside   GI and DVT prophylaxis    All the records are reviewed and case discussed with ED provider. Management plans discussed with the patient, family and they are in agreement.  CODE STATUS: fc , sister and niece are healthcare power of attorney  TOTAL CRITICAL CARE TIME TAKING CARE OF THIS PATIENT: 45 minutes.   Note: This dictation was prepared with Dragon dictation along with smaller phrase technology. Any transcriptional errors that result from this process are unintentional.  Ramonita Lab M.D on 01/19/2017 at 6:16 PM  Between 7am to 6pm - Pager - [phone]  After 6pm go to [email or web address] - password [email or web address]  [facility]  Office  [phone]  CC: Primary care physician; Patient, No Pcp Per

## 2017-01-19 NOTE — ED Notes (Signed)
Called code sepsis to [facility]  [phone]

## 2017-01-19 NOTE — Consult Note (Signed)
Name: Jacob Villanueva MRN: [id number] DOB: 01-12-1951    ADMISSION DATE:  01/19/2017 CONSULTATION DATE: 01/19/2017  REFERRING MD : Dr. Amado Coe  CHIEF COMPLAINT: Altered Mental Status   BRIEF PATIENT DESCRIPTION:  66 yo male admitted 06/7 with acute hypoxic respiratory failure secondary to ?pneumonia, acute renal failure, and diarrhea   SIGNIFICANT EVENTS  06/7-Pt admitted to [facility] Unit   STUDIES:  CT Chest and Renal Stone Study 06/7>>Bilateral posterior lower lobe collapse/consolidation with bronchial wall thickening and small airway impaction. These changes may be related to atypical infection or bronchopneumonia, but in the appropriate clinical setting, aspiration could have the same imaging findings. Hepatic steatosis. Marked gallbladder distention without biliary dilatation. Status post right hemicolectomy without evidence for bowel obstruction or bowel wall thickening. No intraperitoneal free fluid. Stable appearance L1 superior endplate compression deformity.  HISTORY OF PRESENT ILLNESS:   This is a 66 yo male with a PMH of Traumatic Brain Injury, Current Everyday Smoker, and HTN.  He presented to [facility] ER 06/7 from an Independent Living Facility at [facility] with altered mental status worse from baseline.  Per ER notes the pts family notified EMS due to symptoms, and upon EMS arrival the pt had decreased responsiveness and appeared acutely ill.  Per ER notes the morning of 06/7 the pt did not show up for breakfast when staff went to check on him he was found on the ground awake, but less responsive and tachycardic. The pts family stated the pt was diagnosed with hypocalcemia 1 week prior to presentation to the ER, and has not been taking his prescribed supplements.  He also has had diarrhea x1 week.  In the ER it was noted the pt was diaphoretic, tachycardic, and hypoxic with SPO2 88% on 4L via nasal canula requiring nonrebreather mask.  Lab results revealed K+ 2.6, creatinine 3.63, anion gap  19, lipase 53, ast 126, total bilirubin 1.8, troponin 0.05, pct 3.27, lactic avid 5.5, and wbc 24.0 ruling the pt in for sepsis.  Therefore, he was subsequently admitted to the [facility] Unit by hospitalist team for further workup and treatment.  PAST MEDICAL HISTORY :   has a past medical history of Hypertension and TBI (traumatic brain injury) (HCC).  has a past surgical history that includes Fracture surgery and Appendectomy. Prior to Admission medications   Medication Sig Start Date End Date Taking? Authorizing Provider  allopurinol (ZYLOPRIM) 100 MG tablet Take 100 mg by mouth daily. 01/12/17  Yes [provider]  amLODipine (NORVASC) 10 MG tablet Take 10 mg by mouth daily.   Yes [provider]  calcium carbonate (TUMS EX) 750 MG chewable tablet Chew 1 tablet by mouth 2 (two) times daily.   Yes [provider]  esomeprazole (NEXIUM) 40 MG capsule Take 40 mg by mouth 2 (two) times daily before a meal.    Yes [provider]  gabapentin (NEURONTIN) 300 MG capsule Take 300 mg by mouth 3 (three) times daily.   Yes [provider]  hydrochlorothiazide (HYDRODIURIL) 25 MG tablet Take 25 mg by mouth daily.   Yes [provider]  loratadine (CLARITIN) 10 MG tablet Take 10 mg by mouth 2 (two) times daily.    Yes [provider]  NON FORMULARY 1 Dose every 3 (three) months. Testa Pill. A testosterone pill that is implanted into side q 3 months   Yes [provider]  ranitidine (ZANTAC) 150 MG tablet Take 150 mg by mouth at bedtime.    Yes [provider]  sertraline (ZOLOFT) 50 MG tablet Take 50 mg by mouth every morning. 01/12/17  Yes [provider]  simethicone (MYLICON) 125 MG chewable tablet Chew 125 mg by mouth 2 (two) times daily.   Yes [provider]  promethazine (PHENERGAN) 25 MG tablet Take 1 tablet (25 mg total) by mouth every 6 (six) hours as needed for nausea. Patient not taking: Reported on  01/19/2017 01/13/13   Eber HongMiller, Brian, MD   Allergies  Allergen Reactions  . Effexor [Venlafaxine Hcl]     DELUSIONAL   . Amoxicillin Rash    FAMILY HISTORY:  family history is not on file. SOCIAL HISTORY:  reports that he has been smoking.  He has been smoking about 2.00 packs per day. He has never used smokeless tobacco. He reports that he drinks alcohol. He reports that he does not use drugs.  REVIEW OF SYSTEMS:   Unable to assess pt lethargic   SUBJECTIVE:  Pt currently lethargic  VITAL SIGNS: Temp:  [99.3 F (37.4 C)-99.8 F (37.7 C)] 99.8 F (37.7 C) (06/07 1400) Pulse Rate:  [76-124] 77 (06/07 1615) Resp:  [18-26] 19 (06/07 1615) BP: (92-120)/(63-75) 92/69 (06/07 1615) SpO2:  [88 %-99 %] 99 % (06/07 1615) Weight:  [90.3 kg (199 lb)] 90.3 kg (199 lb) (06/07 1258)  PHYSICAL EXAMINATION: General: acutely ill appearing Caucasian male, NAD  Neuro: lethargic, opens eyes to voice, not following commands, PERRL HEENT: supple, no JVD Cardiovascular: nsr, s1s2, no M/R/G Lungs: rhonchi and faint wheezes throughout, even, non labored on nasal canula  Abdomen: +BS x4, soft, non tender, non distended  Musculoskeletal: normal bulk and tone, no edema  Skin: intact no rashes or lesions    Recent Labs Lab 01/19/17 1239  NA 140  K 2.6*  CL 98*  CO2 23  BUN 28*  CREATININE 3.63*  GLUCOSE 179*    Recent Labs Lab 01/19/17 1239  HGB 16.8  HCT 49.1  WBC 24.0*  PLT 264   Ct Chest Wo Contrast  Result Date: 01/19/2017 CLINICAL DATA:  The diaphoretic and tachycardic. Abdominal pain and diarrhea. EXAM: CT CHEST, ABDOMEN AND PELVIS WITHOUT CONTRAST TECHNIQUE: Multidetector CT imaging of the chest, abdomen and pelvis was performed following the standard protocol without IV contrast. COMPARISON:  Abdomen and pelvis CT 11/23/2012 FINDINGS: CT CHEST FINDINGS Cardiovascular: The heart size is normal. No pericardial effusion. Coronary artery calcification is noted. Atherosclerotic  calcification is noted in the wall of the thoracic aorta. Mediastinum/Nodes: No mediastinal lymphadenopathy. No evidence for gross hilar lymphadenopathy although assessment is limited by the lack of intravenous contrast on today's study. The esophagus has normal imaging features. There is no axillary lymphadenopathy. Lungs/Pleura: Assessment lung parenchyma is degraded by breathing motion. There is bibasilar atelectasis. Bronchial wall thickening is most evident in the lower lobes and areas of small airway impaction are seen in the lower lobes bilaterally. Probable scarring in the right middle lobe and lingula slightly progressive since prior abdomen and pelvis CT. No pulmonary edema or pleural effusion. Musculoskeletal: Old left seventh rib fracture noted. CT ABDOMEN PELVIS FINDINGS Hepatobiliary: The liver shows diffusely decreased attenuation suggesting steatosis. Gallbladder is markedly distended. No intrahepatic or extrahepatic biliary dilation. Pancreas: Pancreas diffusely fatty replaced without focal mass lesion or dilatation of the main duct. Spleen: No splenomegaly. No focal mass lesion. Adrenals/Urinary Tract: No adrenal nodule or mass. Kidneys are unremarkable bilaterally. No evidence for hydroureter. The urinary bladder appears normal for the degree of distention. Stomach/Bowel: Stomach is nondistended. No  gastric wall thickening. No evidence of outlet obstruction. Duodenum is normally positioned as is the ligament of Treitz. No small bowel wall thickening. No small bowel dilatation. Patient is status post right hemicolectomy. Diverticular changes are noted in the left colon without evidence of diverticulitis. Vascular/Lymphatic: There is abdominal aortic atherosclerosis without aneurysm. There is no gastrohepatic or hepatoduodenal ligament lymphadenopathy. No intraperitoneal or retroperitoneal lymphadenopathy. No pelvic sidewall lymphadenopathy. Reproductive: The prostate gland and seminal vesicles have  normal imaging features. Other: No intraperitoneal free fluid. Musculoskeletal: Bone windows reveal no worrisome lytic or sclerotic osseous lesions. Stable appearance L1 superior endplate compression deformity. IMPRESSION: 1. Bilateral posterior lower lobe collapse/consolidation with bronchial wall thickening and small airway impaction. These changes may be related to atypical infection or bronchopneumonia, but in the appropriate clinical setting, aspiration could have the same imaging findings. 2. Hepatic steatosis. 3. Marked gallbladder distention without biliary dilatation. 4. Status post right hemicolectomy without evidence for bowel obstruction or bowel wall thickening. No intraperitoneal free fluid. 5. Stable appearance L1 superior endplate compression deformity. Electronically Signed   By: Kennith Center M.D.   On: 01/19/2017 14:51   Dg Chest Portable 1 View  Result Date: 01/19/2017 CLINICAL DATA:  Diaphoresis and tachycardia. Recently diagnosed with hypocalcemia. History of traumatic brain injury. Current smoker. EXAM: PORTABLE CHEST 1 VIEW COMPARISON:  None in PACs FINDINGS: The lungs are adequately inflated. There is no focal infiltrate. There is no pleural effusion. The heart and pulmonary vascularity are normal. The mediastinum is normal in width. The bony thorax exhibits no acute abnormality. IMPRESSION: There is no acute cardiopulmonary abnormality. Electronically Signed   By: Demetrice Amstutz  [place] M.D.   On: 01/19/2017 13:41   Ct Renal Stone Study  Result Date: 01/19/2017 CLINICAL DATA:  The diaphoretic and tachycardic. Abdominal pain and diarrhea. EXAM: CT CHEST, ABDOMEN AND PELVIS WITHOUT CONTRAST TECHNIQUE: Multidetector CT imaging of the chest, abdomen and pelvis was performed following the standard protocol without IV contrast. COMPARISON:  Abdomen and pelvis CT 11/23/2012 FINDINGS: CT CHEST FINDINGS Cardiovascular: The heart size is normal. No pericardial effusion. Coronary artery calcification is  noted. Atherosclerotic calcification is noted in the wall of the thoracic aorta. Mediastinum/Nodes: No mediastinal lymphadenopathy. No evidence for gross hilar lymphadenopathy although assessment is limited by the lack of intravenous contrast on today's study. The esophagus has normal imaging features. There is no axillary lymphadenopathy. Lungs/Pleura: Assessment lung parenchyma is degraded by breathing motion. There is bibasilar atelectasis. Bronchial wall thickening is most evident in the lower lobes and areas of small airway impaction are seen in the lower lobes bilaterally. Probable scarring in the right middle lobe and lingula slightly progressive since prior abdomen and pelvis CT. No pulmonary edema or pleural effusion. Musculoskeletal: Old left seventh rib fracture noted. CT ABDOMEN PELVIS FINDINGS Hepatobiliary: The liver shows diffusely decreased attenuation suggesting steatosis. Gallbladder is markedly distended. No intrahepatic or extrahepatic biliary dilation. Pancreas: Pancreas diffusely fatty replaced without focal mass lesion or dilatation of the main duct. Spleen: No splenomegaly. No focal mass lesion. Adrenals/Urinary Tract: No adrenal nodule or mass. Kidneys are unremarkable bilaterally. No evidence for hydroureter. The urinary bladder appears normal for the degree of distention. Stomach/Bowel: Stomach is nondistended. No gastric wall thickening. No evidence of outlet obstruction. Duodenum is normally positioned as is the ligament of Treitz. No small bowel wall thickening. No small bowel dilatation. Patient is status post right hemicolectomy. Diverticular changes are noted in the left colon without evidence of diverticulitis. Vascular/Lymphatic: There is abdominal aortic atherosclerosis without  aneurysm. There is no gastrohepatic or hepatoduodenal ligament lymphadenopathy. No intraperitoneal or retroperitoneal lymphadenopathy. No pelvic sidewall lymphadenopathy. Reproductive: The prostate gland  and seminal vesicles have normal imaging features. Other: No intraperitoneal free fluid. Musculoskeletal: Bone windows reveal no worrisome lytic or sclerotic osseous lesions. Stable appearance L1 superior endplate compression deformity. IMPRESSION: 1. Bilateral posterior lower lobe collapse/consolidation with bronchial wall thickening and small airway impaction. These changes may be related to atypical infection or bronchopneumonia, but in the appropriate clinical setting, aspiration could have the same imaging findings. 2. Hepatic steatosis. 3. Marked gallbladder distention without biliary dilatation. 4. Status post right hemicolectomy without evidence for bowel obstruction or bowel wall thickening. No intraperitoneal free fluid. 5. Stable appearance L1 superior endplate compression deformity. Electronically Signed   By: Kennith Center M.D.   On: 01/19/2017 14:51    ASSESSMENT / PLAN: Acute hypoxic respiratory failure secondary to ?pneumonia Acute encephalopathy Sepsis   Acute renal failure Lactic acidosis  Leukocytosis Hypokalemia secondary to diarrhea  Elevated AST and Lipase  Mildly elevated troponin likely demand ischemia  Hx: Traumatic Brain Injury, Current Everyday Smoker, and HTN  P: Supplemental O2 to maintain O2 sats >92% Repeat cxr in am  Prn bronchodilator therapy  Continue abx  Follow cultures Trend WBC and monitor fever curve Trend PCT and lactic acid Cdiff and GI panel pending  Repeat CMP in the am  Replace electrolytes as indicated  Monitor UOP  Renal US pending  NS with 20 meq K+ @ 125 ml/hr  Continuous telemetry monitoring  Trend troponin's Avoid sedating medication  Stat ammonia level  Keep NPO until mentation improves  Will need smoking cessation counseling once mentation improves   Sonda Rumble, AGNP  Pulmonary/Critical Care Pager [phone] (please enter 7 digits) PCCM Consult Pager [phone] (please enter 7 digits)  Billy Fischer, MD PCCM  service Mobile [phone] Pager [phone] 01/20/2017 11:21 AM

## 2017-01-19 NOTE — Progress Notes (Signed)
Pharmacy Antibiotic Note  Nicoletta Balex Thum is a 66 y.o. male admitted on 01/19/2017 with HCAP/sepsis.  Pharmacy has been consulted for cefepime and vancomycin dosing.  Plan: 1. Cefepime 2 gm IV x 1 in ED followed by cefepime 1 gm IV Q24H 2. Vancomycin 1 gm IV x 1 in ED followed in approximately 6 hours (stacked dosing) by vancomycin 1.25 gm IV Q36H - will likely need to be empirically adjusted based on creatinine clearance before first level - predicted trough 16 mcg/mL. Pharmacy will continue to follow and adjust as needed to maintain trough 15 to 20 mcg/mL.   Vd 59.8 L, Ke 0.023 hr-1, T1/2 29.9 hr  Height: 6\' 2"  (188 cm) Weight: 199 lb (90.3 kg) IBW/kg (Calculated) : 82.2  Temp (24hrs), Avg:99.6 F (37.6 C), Min:99.3 F (37.4 C), Max:99.8 F (37.7 C)   Recent Labs Lab 01/19/17 1238 01/19/17 1239  WBC  --  24.0*  CREATININE  --  3.63*  LATICACIDVEN 5.5*  --     Estimated Creatinine Clearance: 22.6 mL/min (A) (by C-G formula based on SCr of 3.63 mg/dL (H)).    Allergies  Allergen Reactions  . Effexor [Venlafaxine Hcl]     DELUSIONAL   . Amoxicillin Rash    Thank you for allowing pharmacy to be a part of this patient's care.  Carola FrostNathan A Aashrith Eves, Pharm.D., BCPS Clinical Pharmacist 01/19/2017 2:00 PM

## 2017-01-19 NOTE — ED Notes (Signed)
Date and time results received: 01/19/17 1:26 PM  (use smartphrase ".now" to insert current time)  Test: lactic acid Critical Value: 5.5  Name of Provider Notified: Dr. York CeriseForbach  Orders Received? Or Actions Taken?: Orders Received - See Orders for details

## 2017-01-19 NOTE — ED Notes (Signed)
Date and time results received: 01/19/17 1:58 PM  (use smartphrase ".now" to insert current time)  Test: troponin Critical Value: 0.05  Name of Provider Notified: Dr. York CeriseForbach  Orders Received? Or Actions Taken?: Orders Received - See Orders for details

## 2017-01-19 NOTE — ED Notes (Signed)
Admitting MD at bedside.

## 2017-01-20 ENCOUNTER — Inpatient Hospital Stay: Payer: Medicare Other

## 2017-01-20 LAB — COMPREHENSIVE METABOLIC PANEL
ALBUMIN: 3.2 g/dL — AB (ref 3.5–5.0)
ALK PHOS: 57 U/L (ref 38–126)
ALT: 58 U/L (ref 17–63)
AST: 375 U/L — AB (ref 15–41)
Anion gap: 7 (ref 5–15)
BUN: 25 mg/dL — AB (ref 6–20)
CALCIUM: 7.2 mg/dL — AB (ref 8.9–10.3)
CHLORIDE: 102 mmol/L (ref 101–111)
CO2: 23 mmol/L (ref 22–32)
Creatinine, Ser: 1.82 mg/dL — ABNORMAL HIGH (ref 0.61–1.24)
GFR calc Af Amer: 42 mL/min — ABNORMAL LOW (ref >60)
GFR calc non Af Amer: 36 mL/min — ABNORMAL LOW (ref >60)
GLUCOSE: 107 mg/dL — AB (ref 65–99)
Potassium: 2.8 mmol/L — ABNORMAL LOW (ref 3.5–5.1)
SODIUM: 132 mmol/L — AB (ref 135–145)
Total Bilirubin: 1 mg/dL (ref 0.3–1.2)
Total Protein: 5.6 g/dL — ABNORMAL LOW (ref 6.5–8.1)

## 2017-01-20 LAB — BLOOD CULTURE ID PANEL (REFLEXED)
Acinetobacter baumannii: NOT DETECTED
CANDIDA GLABRATA: NOT DETECTED
CANDIDA TROPICALIS: NOT DETECTED
Candida albicans: NOT DETECTED
Candida krusei: NOT DETECTED
Candida parapsilosis: NOT DETECTED
ENTEROBACTER CLOACAE COMPLEX: NOT DETECTED
Enterobacteriaceae species: NOT DETECTED
Enterococcus species: NOT DETECTED
Escherichia coli: NOT DETECTED
Haemophilus influenzae: NOT DETECTED
KLEBSIELLA PNEUMONIAE: NOT DETECTED
Klebsiella oxytoca: NOT DETECTED
Listeria monocytogenes: NOT DETECTED
Methicillin resistance: NOT DETECTED
NEISSERIA MENINGITIDIS: NOT DETECTED
PROTEUS SPECIES: NOT DETECTED
Pseudomonas aeruginosa: NOT DETECTED
SERRATIA MARCESCENS: NOT DETECTED
STAPHYLOCOCCUS AUREUS BCID: NOT DETECTED
STAPHYLOCOCCUS SPECIES: DETECTED — AB
STREPTOCOCCUS SPECIES: NOT DETECTED
Streptococcus agalactiae: NOT DETECTED
Streptococcus pneumoniae: NOT DETECTED
Streptococcus pyogenes: NOT DETECTED

## 2017-01-20 LAB — CBC
HCT: 36.4 % — ABNORMAL LOW (ref 40.0–52.0)
Hemoglobin: 12.6 g/dL — ABNORMAL LOW (ref 13.0–18.0)
MCH: 32.2 pg (ref 26.0–34.0)
MCHC: 34.7 g/dL (ref 32.0–36.0)
MCV: 92.8 fL (ref 80.0–100.0)
PLATELETS: 176 10*3/uL (ref 150–440)
RBC: 3.92 MIL/uL — AB (ref 4.40–5.90)
RDW: 13.3 % (ref 11.5–14.5)
WBC: 15.7 10*3/uL — ABNORMAL HIGH (ref 3.8–10.6)

## 2017-01-20 LAB — MAGNESIUM: MAGNESIUM: 1.8 mg/dL (ref 1.7–2.4)

## 2017-01-20 LAB — URINE CULTURE: Culture: NO GROWTH

## 2017-01-20 LAB — RAPID HIV SCREEN (HIV 1/2 AB+AG)
HIV 1/2 ANTIBODIES: NONREACTIVE
HIV-1 P24 Antigen - HIV24: NONREACTIVE

## 2017-01-20 MED ORDER — MAGNESIUM SULFATE 2 GM/50ML IV SOLN
2.0000 g | Freq: Once | INTRAVENOUS | Status: AC
  Administered 2017-01-20: 17:00:00 2 g via INTRAVENOUS
  Filled 2017-01-20: qty 50

## 2017-01-20 MED ORDER — POTASSIUM CHLORIDE CRYS ER 20 MEQ PO TBCR
40.0000 meq | EXTENDED_RELEASE_TABLET | Freq: Three times a day (TID) | ORAL | Status: DC
  Administered 2017-01-20 – 2017-01-21 (×3): 40 meq via ORAL
  Filled 2017-01-20 (×3): qty 2

## 2017-01-20 MED ORDER — SERTRALINE HCL 50 MG PO TABS
50.0000 mg | ORAL_TABLET | Freq: Every morning | ORAL | Status: DC
  Administered 2017-01-20 – 2017-01-24 (×5): 50 mg via ORAL
  Filled 2017-01-20 (×5): qty 1

## 2017-01-20 MED ORDER — PANTOPRAZOLE SODIUM 40 MG PO TBEC
40.0000 mg | DELAYED_RELEASE_TABLET | Freq: Every day | ORAL | Status: DC
  Administered 2017-01-20 – 2017-01-24 (×5): 40 mg via ORAL
  Filled 2017-01-20 (×5): qty 1

## 2017-01-20 MED ORDER — POTASSIUM CHLORIDE 10 MEQ/100ML IV SOLN
10.0000 meq | INTRAVENOUS | Status: AC
  Administered 2017-01-20 (×2): 10 meq via INTRAVENOUS
  Filled 2017-01-20 (×2): qty 100

## 2017-01-20 MED ORDER — ALLOPURINOL 100 MG PO TABS
100.0000 mg | ORAL_TABLET | Freq: Every day | ORAL | Status: DC
  Administered 2017-01-20 – 2017-01-24 (×5): 100 mg via ORAL
  Filled 2017-01-20 (×5): qty 1

## 2017-01-20 MED ORDER — ENOXAPARIN SODIUM 40 MG/0.4ML ~~LOC~~ SOLN
40.0000 mg | SUBCUTANEOUS | Status: DC
  Administered 2017-01-21 – 2017-01-23 (×3): 40 mg via SUBCUTANEOUS
  Filled 2017-01-20 (×3): qty 0.4

## 2017-01-20 MED ORDER — METRONIDAZOLE 500 MG PO TABS
500.0000 mg | ORAL_TABLET | Freq: Three times a day (TID) | ORAL | Status: DC
  Administered 2017-01-20 – 2017-01-24 (×12): 500 mg via ORAL
  Filled 2017-01-20 (×13): qty 1

## 2017-01-20 NOTE — Progress Notes (Signed)
[facility] room 109. [facility] IV Mg pending from pharmacy. Dose to be given on unit. Sister Pam updated.

## 2017-01-20 NOTE — Consult Note (Signed)
[facility] Infectious Disease     Reason for Consult:sepsis    Referring Physician: Jeronimo Greaves Date of Admission:  01/19/2017   Active Problems:   Acute encephalopathy   HPI: Jacob Villanueva is a 66 y.o. male admitted with AMS and decreased po intake as well as diarrhea. He has hx TBI and is at [facility].  Was found on the ground apparently.  On admit wbc 24, temp 99.  UA negative. CT chest with bilat consolidation collapse and bronchial wall thickening. CT abd with marked GB thicening. Hypoxic so placed on O2. Clinically improved and transferred to floor today. WBC down to 15.  MRA PCR neg, 1/2 BCX + Staph species.   Today he was seen in his room as he was trying to get oob. When asked how he was feeling he angrily said "I feel shitty" but cannot describe why. He states he had been vomiting at home but can provide no other history.  Past Medical History:  Diagnosis Date  . Hypertension   . TBI (traumatic brain injury) [facility])    Past Surgical History:  Procedure Laterality Date  . APPENDECTOMY    . FRACTURE SURGERY     Social History  Substance Use Topics  . Smoking status: Current Every Day Smoker    Packs/day: 2.00  . Smokeless tobacco: Never Used  . Alcohol use Yes   No family history on file.  Allergies:  Allergies  Allergen Reactions  . Effexor [Venlafaxine Hcl]     DELUSIONAL   . Amoxicillin Rash    Current antibiotics: Antibiotics Given (last 72 hours)    Date/Time Action Medication Dose Rate   01/19/17 1315 New Bag/Given   ceFEPIme (MAXIPIME) 2 g in dextrose 5 % 50 mL IVPB 2 g 100 mL/hr   01/19/17 1348 New Bag/Given   vancomycin (VANCOCIN) IVPB 1000 mg/200 mL premix 1,000 mg 200 mL/hr   01/19/17 2059 New Bag/Given   vancomycin (VANCOCIN) 1,250 mg in sodium chloride 0.9 % 250 mL IVPB 1,250 mg 166.7 mL/hr   01/20/17 1044 New Bag/Given   ceFEPIme (MAXIPIME) 1 g in dextrose 5 % 50 mL IVPB 1 g 100 mL/hr      MEDICATIONS: . enoxaparin (LOVENOX) injection  30 mg  Subcutaneous Q24H  . mouth rinse  15 mL Mouth Rinse BID  . potassium chloride  40 mEq Oral TID    Review of Systems - unable to obtain  OBJECTIVE: Temp:  [98.3 F (36.8 C)-99.8 F (37.7 C)] 99.2 F (37.3 C) (06/08 1200) Pulse Rate:  [68-99] 82 (06/08 1300) Resp:  [13-24] 15 (06/08 1300) BP: (87-139)/(56-90) 104/69 (06/08 1200) SpO2:  [86 %-100 %] 88 % (06/08 1300) Weight:  [88.8 kg (195 lb 12.3 oz)] 88.8 kg (195 lb 12.3 oz) (06/07 1800) Physical Exam  Constitutional: He is awake but somewhat agitated and not oriented HENT: anicteric Mouth/Throat: Oropharynx is clear and dry . No oropharyngeal exudate. Does have white spot on hard palate Cardiovascular: Normal rate, regular rhythm and normal heart sounds. Exam reveals no gallop and no friction rub.  Pulmonary/Chest: bil rhonchi Abdominal: Soft. Mild distention Bowel sounds are normal. There is mild nonfocal tenderness.  Lymphadenopathy: He has no cervical adenopathy.  Neurological:e is awake but somewhat agitated and not oriented Skin: Skin is warm and dry. No rash noted. No erythema.  Psychiatric: agitated   LABS: Results for orders placed or performed during the hospital encounter of 01/19/17 (from the past 48 hour(s))  Lactic acid, plasma  Status: Abnormal   Collection Time: 01/19/17 12:38 PM  Result Value Ref Range   Lactic Acid, Venous 5.5 (HH) 0.5 - 1.9 mmol/L    Comment: CRITICAL RESULT CALLED TO, READ BACK BY AND VERIFIED WITH JAMIE BOWEN AT 1325 ON 01/19/17 ALV   Comprehensive metabolic panel     Status: Abnormal   Collection Time: 01/19/17 12:39 PM  Result Value Ref Range   Sodium 140 135 - 145 mmol/L   Potassium 2.6 (LL) 3.5 - 5.1 mmol/L    Comment: CRITICAL RESULT CALLED TO, READ BACK BY AND VERIFIED WITH JAMIE BOWEN AT 1334 ON 01/19/17 ALV    Chloride 98 (L) 101 - 111 mmol/L   CO2 23 22 - 32 mmol/L   Glucose, Bld 179 (H) 65 - 99 mg/dL   BUN 28 (H) 6 - 20 mg/dL   Creatinine, Ser 3.63 (H) 0.61 - 1.24 mg/dL    Calcium 9.5 8.9 - 10.3 mg/dL   Total Protein 8.2 (H) 6.5 - 8.1 g/dL   Albumin 4.6 3.5 - 5.0 g/dL   AST 126 (H) 15 - 41 U/L   ALT 25 17 - 63 U/L    Comment: RESULT CONFIRMED BY MANUAL DILUTION.ALV   Alkaline Phosphatase 88 38 - 126 U/L   Total Bilirubin 1.8 (H) 0.3 - 1.2 mg/dL   GFR calc non Af Amer 16 (L) >60 mL/min   GFR calc Af Amer 18 (L) >60 mL/min    Comment: (NOTE) The eGFR has been calculated using the CKD EPI equation. This calculation has not been validated in all clinical situations. eGFR's persistently <60 mL/min signify possible Chronic Kidney Disease.    Anion gap 19 (H) 5 - 15  CBC with Differential     Status: Abnormal   Collection Time: 01/19/17 12:39 PM  Result Value Ref Range   WBC 24.0 (H) 3.8 - 10.6 K/uL   RBC 5.30 4.40 - 5.90 MIL/uL   Hemoglobin 16.8 13.0 - 18.0 g/dL   HCT 49.1 40.0 - 52.0 %   MCV 92.6 80.0 - 100.0 fL   MCH 31.6 26.0 - 34.0 pg   MCHC 34.2 32.0 - 36.0 g/dL   RDW 13.3 11.5 - 14.5 %   Platelets 264 150 - 440 K/uL   Neutrophils Relative % 88 %   Neutro Abs 21.3 (H) 1.4 - 6.5 K/uL   Lymphocytes Relative 4 %   Lymphs Abs 0.8 (L) 1.0 - 3.6 K/uL   Monocytes Relative 8 %   Monocytes Absolute 1.8 (H) 0.2 - 1.0 K/uL   Eosinophils Relative 0 %   Eosinophils Absolute 0.0 0 - 0.7 K/uL   Basophils Relative 0 %   Basophils Absolute 0.0 0 - 0.1 K/uL  Protime-INR     Status: None   Collection Time: 01/19/17 12:39 PM  Result Value Ref Range   Prothrombin Time 14.8 11.4 - 15.2 seconds   INR 1.15   Lipase, blood     Status: Abnormal   Collection Time: 01/19/17 12:39 PM  Result Value Ref Range   Lipase 53 (H) 11 - 51 U/L  Brain natriuretic peptide - IF patient is dyspneic     Status: None   Collection Time: 01/19/17 12:39 PM  Result Value Ref Range   B Natriuretic Peptide 28.0 0.0 - 100.0 pg/mL  Troponin I     Status: Abnormal   Collection Time: 01/19/17 12:39 PM  Result Value Ref Range   Troponin I 0.05 (HH) <0.03 ng/mL    Comment: CRITICAL  RESULT CALLED TO, READ BACK BY AND VERIFIED WITH JAMIE BOWEN AT 1334 ON 01/19/17 ALV   Procalcitonin     Status: None   Collection Time: 01/19/17 12:39 PM  Result Value Ref Range   Procalcitonin 3.27 ng/mL    Comment:        Interpretation: PCT > 2 ng/mL: Systemic infection (sepsis) is likely, unless other causes are known. (NOTE)         ICU PCT Algorithm               Non ICU PCT Algorithm    ----------------------------     ------------------------------         PCT < 0.25 ng/mL                 PCT < 0.1 ng/mL     Stopping of antibiotics            Stopping of antibiotics       strongly encouraged.               strongly encouraged.    ----------------------------     ------------------------------       PCT level decrease by               PCT < 0.25 ng/mL       >= 80% from peak PCT       OR PCT 0.25 - 0.5 ng/mL          Stopping of antibiotics                                             encouraged.     Stopping of antibiotics           encouraged.    ----------------------------     ------------------------------       PCT level decrease by              PCT >= 0.25 ng/mL       < 80% from peak PCT        AND PCT >= 0.5 ng/mL            Continuing antibiotics                                               encouraged.       Continuing antibiotics            encouraged.    ----------------------------     ------------------------------     PCT level increase compared          PCT > 0.5 ng/mL         with peak PCT AND          PCT >= 0.5 ng/mL             Escalation of antibiotics                                          strongly encouraged.      Escalation of antibiotics        strongly encouraged.   Magnesium     Status: None   Collection  Time: 01/19/17 12:39 PM  Result Value Ref Range   Magnesium 2.4 1.7 - 2.4 mg/dL  Culture, blood (Routine x 2)     Status: None (Preliminary result)   Collection Time: 01/19/17 12:46 PM  Result Value Ref Range   Specimen Description  BLOOD R HAND    Special Requests      BOTTLES DRAWN AEROBIC AND ANAEROBIC Blood Culture results may not be optimal due to an excessive volume of blood received in culture bottles   Culture  Setup Time      Organism ID to follow [place] AND ANAEROBIC BOTTLES CRITICAL RESULT CALLED TO, READ BACK BY AND VERIFIED WITH: KAREN HAYES 01/20/17 0945 SGD    Culture GRAM POSITIVE COCCI    Report Status PENDING   Culture, blood (Routine x 2)     Status: None (Preliminary result)   Collection Time: 01/19/17 12:46 PM  Result Value Ref Range   Specimen Description BLOOD R ARM    Special Requests      BOTTLES DRAWN AEROBIC AND ANAEROBIC Blood Culture adequate volume   Culture  Setup Time      GRAM POSITIVE COCCI AEROBIC BOTTLE ONLY CRITICAL VALUE NOTED.  VALUE IS CONSISTENT WITH PREVIOUSLY REPORTED AND CALLED VALUE.    Culture GRAM POSITIVE COCCI    Report Status PENDING   Blood Culture ID Panel (Reflexed)     Status: Abnormal   Collection Time: 01/19/17 12:46 PM  Result Value Ref Range   Enterococcus species NOT DETECTED NOT DETECTED   Listeria monocytogenes NOT DETECTED NOT DETECTED   Staphylococcus species DETECTED (A) NOT DETECTED    Comment: Methicillin (oxacillin) susceptible coagulase negative staphylococcus. Possible blood culture contaminant (unless isolated from more than one blood culture draw or clinical case suggests pathogenicity). No antibiotic treatment is indicated for blood  culture contaminants. CRITICAL RESULT CALLED TO, READ BACK BY AND VERIFIED WITH: KAREN HAYES 01/20/17 0945 SGD    Staphylococcus aureus NOT DETECTED NOT DETECTED   Methicillin resistance NOT DETECTED NOT DETECTED   Streptococcus species NOT DETECTED NOT DETECTED   Streptococcus agalactiae NOT DETECTED NOT DETECTED   Streptococcus pneumoniae NOT DETECTED NOT DETECTED   Streptococcus pyogenes NOT DETECTED NOT DETECTED   Acinetobacter baumannii NOT DETECTED NOT DETECTED    Enterobacteriaceae species NOT DETECTED NOT DETECTED   Enterobacter cloacae complex NOT DETECTED NOT DETECTED   Escherichia coli NOT DETECTED NOT DETECTED   Klebsiella oxytoca NOT DETECTED NOT DETECTED   Klebsiella pneumoniae NOT DETECTED NOT DETECTED   Proteus species NOT DETECTED NOT DETECTED   Serratia marcescens NOT DETECTED NOT DETECTED   Haemophilus influenzae NOT DETECTED NOT DETECTED   Neisseria meningitidis NOT DETECTED NOT DETECTED   Pseudomonas aeruginosa NOT DETECTED NOT DETECTED   Candida albicans NOT DETECTED NOT DETECTED   Candida glabrata NOT DETECTED NOT DETECTED   Candida krusei NOT DETECTED NOT DETECTED   Candida parapsilosis NOT DETECTED NOT DETECTED   Candida tropicalis NOT DETECTED NOT DETECTED  Urine culture     Status: None   Collection Time: 01/19/17  1:36 PM  Result Value Ref Range   Specimen Description URINE, RANDOM    Special Requests NONE    Culture      NO GROWTH Performed at [facility], [place] [address] [place], [place] [address]    Report Status 01/20/2017 FINAL   Urinalysis, Routine w reflex microscopic     Status: Abnormal   Collection Time: 01/19/17  1:36 PM  Result Value Ref Range   Color, Urine AMBER (A) YELLOW    Comment: BIOCHEMICALS MAY BE AFFECTED BY COLOR   APPearance HAZY (A) CLEAR   Specific Gravity, Urine 1.030 1.005 - 1.030   pH 5.0 5.0 - 8.0   Glucose, UA NEGATIVE NEGATIVE mg/dL   Hgb urine dipstick MODERATE (A) NEGATIVE   Bilirubin Urine SMALL (A) NEGATIVE   Ketones, ur 5 (A) NEGATIVE mg/dL   Protein, ur 100 (A) NEGATIVE mg/dL   Nitrite NEGATIVE NEGATIVE   Leukocytes, UA NEGATIVE NEGATIVE   RBC / HPF 0-5 0 - 5 RBC/hpf   WBC, UA 0-5 0 - 5 WBC/hpf   Bacteria, UA NONE SEEN NONE SEEN   Squamous Epithelial / LPF 0-5 (A) NONE SEEN   Mucous PRESENT   Lactic acid, plasma     Status: Abnormal   Collection Time: 01/19/17  3:17 PM  Result Value Ref Range   Lactic Acid, Venous 3.5 (HH) 0.5 - 1.9 mmol/L    Comment: CRITICAL  RESULT CALLED TO, READ BACK BY AND VERIFIED WITH SAMANTHA HAMILTON 01/19/17 1604 KLW   Blood gas, arterial     Status: None   Collection Time: 01/19/17  3:25 PM  Result Value Ref Range   FIO2 1.00    Delivery systems NON-REBREATHER OXYGEN MASK    pH, Arterial 7.40 7.350 - 7.450   pCO2 arterial 36 32.0 - 48.0 mmHg   pO2, Arterial 106 83.0 - 108.0 mmHg   Bicarbonate 22.3 20.0 - 28.0 mmol/L   Acid-base deficit 2.0 0.0 - 2.0 mmol/L   O2 Saturation 98.1 %   Patient temperature 37.0    Collection site RIGHT RADIAL    Sample type ARTERIAL DRAW    Allens test (pass/fail) PASS PASS  MRSA PCR Screening     Status: None   Collection Time: 01/19/17  5:29 PM  Result Value Ref Range   MRSA by PCR NEGATIVE NEGATIVE    Comment:        The GeneXpert MRSA Assay (FDA approved for NASAL specimens only), is one component of a comprehensive MRSA colonization surveillance program. It is not intended to diagnose MRSA infection nor to guide or monitor treatment for MRSA infections.   Glucose, capillary     Status: Abnormal   Collection Time: 01/19/17  5:58 PM  Result Value Ref Range   Glucose-Capillary 156 (H) 65 - 99 mg/dL  Lactic acid, plasma     Status: Abnormal   Collection Time: 01/19/17  5:59 PM  Result Value Ref Range   Lactic Acid, Venous 2.4 (HH) 0.5 - 1.9 mmol/L    Comment: CRITICAL RESULT CALLED TO, READ BACK BY AND VERIFIED WITH [place] RN AT 9381 01/19/17 MSS.   Ammonia     Status: None   Collection Time: 01/19/17  6:06 PM  Result Value Ref Range   Ammonia 18 9 - 35 umol/L  Lactic acid, plasma     Status: None   Collection Time: 01/19/17  9:48 PM  Result Value Ref Range   Lactic Acid, Venous 1.5 0.5 - 1.9 mmol/L  Potassium     Status: Abnormal   Collection Time: 01/19/17  9:48 PM  Result Value Ref Range   Potassium 2.9 (L) 3.5 - 5.1 mmol/L  Comprehensive metabolic panel     Status: Abnormal   Collection Time: 01/20/17  5:08 AM  Result Value Ref Range   Sodium 132 (L)  135 - 145 mmol/L   Potassium 2.8 (L) 3.5 - 5.1  mmol/L   Chloride 102 101 - 111 mmol/L   CO2 23 22 - 32 mmol/L   Glucose, Bld 107 (H) 65 - 99 mg/dL   BUN 25 (H) 6 - 20 mg/dL   Creatinine, Ser 1.82 (H) 0.61 - 1.24 mg/dL   Calcium 7.2 (L) 8.9 - 10.3 mg/dL   Total Protein 5.6 (L) 6.5 - 8.1 g/dL   Albumin 3.2 (L) 3.5 - 5.0 g/dL   AST 375 (H) 15 - 41 U/L   ALT 58 17 - 63 U/L   Alkaline Phosphatase 57 38 - 126 U/L   Total Bilirubin 1.0 0.3 - 1.2 mg/dL   GFR calc non Af Amer 36 (L) >60 mL/min   GFR calc Af Amer 42 (L) >60 mL/min    Comment: (NOTE) The eGFR has been calculated using the CKD EPI equation. This calculation has not been validated in all clinical situations. eGFR's persistently <60 mL/min signify possible Chronic Kidney Disease.    Anion gap 7 5 - 15  CBC     Status: Abnormal   Collection Time: 01/20/17  5:08 AM  Result Value Ref Range   WBC 15.7 (H) 3.8 - 10.6 K/uL   RBC 3.92 (L) 4.40 - 5.90 MIL/uL   Hemoglobin 12.6 (L) 13.0 - 18.0 g/dL    Comment: RESULT REPEATED AND VERIFIED   HCT 36.4 (L) 40.0 - 52.0 %   MCV 92.8 80.0 - 100.0 fL   MCH 32.2 26.0 - 34.0 pg   MCHC 34.7 32.0 - 36.0 g/dL   RDW 13.3 11.5 - 14.5 %   Platelets 176 150 - 440 K/uL  Magnesium     Status: None   Collection Time: 01/20/17  6:19 AM  Result Value Ref Range   Magnesium 1.8 1.7 - 2.4 mg/dL   No components found for: ESR, C REACTIVE PROTEIN MICRO: Recent Results (from the past 720 hour(s))  Culture, blood (Routine x 2)     Status: None (Preliminary result)   Collection Time: 01/19/17 12:46 PM  Result Value Ref Range Status   Specimen Description BLOOD R HAND  Final   Special Requests   Final    BOTTLES DRAWN AEROBIC AND ANAEROBIC Blood Culture results may not be optimal due to an excessive volume of blood received in culture bottles   Culture  Setup Time   Final    Organism ID to follow [place] AND ANAEROBIC BOTTLES CRITICAL RESULT CALLED TO, READ BACK BY AND  VERIFIED WITH: KAREN HAYES 01/20/17 0945 SGD    Culture GRAM POSITIVE COCCI  Final   Report Status PENDING  Incomplete  Culture, blood (Routine x 2)     Status: None (Preliminary result)   Collection Time: 01/19/17 12:46 PM  Result Value Ref Range Status   Specimen Description BLOOD R ARM  Final   Special Requests   Final    BOTTLES DRAWN AEROBIC AND ANAEROBIC Blood Culture adequate volume   Culture  Setup Time   Final    GRAM POSITIVE COCCI AEROBIC BOTTLE ONLY CRITICAL VALUE NOTED.  VALUE IS CONSISTENT WITH PREVIOUSLY REPORTED AND CALLED VALUE.    Culture GRAM POSITIVE COCCI  Final   Report Status PENDING  Incomplete  Blood Culture ID Panel (Reflexed)     Status: Abnormal   Collection Time: 01/19/17 12:46 PM  Result Value Ref Range Status   Enterococcus species NOT DETECTED NOT DETECTED Final   Listeria monocytogenes NOT DETECTED NOT DETECTED Final   Staphylococcus species  DETECTED (A) NOT DETECTED Final    Comment: Methicillin (oxacillin) susceptible coagulase negative staphylococcus. Possible blood culture contaminant (unless isolated from more than one blood culture draw or clinical case suggests pathogenicity). No antibiotic treatment is indicated for blood  culture contaminants. CRITICAL RESULT CALLED TO, READ BACK BY AND VERIFIED WITH: KAREN HAYES 01/20/17 0945 SGD    Staphylococcus aureus NOT DETECTED NOT DETECTED Final   Methicillin resistance NOT DETECTED NOT DETECTED Final   Streptococcus species NOT DETECTED NOT DETECTED Final   Streptococcus agalactiae NOT DETECTED NOT DETECTED Final   Streptococcus pneumoniae NOT DETECTED NOT DETECTED Final   Streptococcus pyogenes NOT DETECTED NOT DETECTED Final   Acinetobacter baumannii NOT DETECTED NOT DETECTED Final   Enterobacteriaceae species NOT DETECTED NOT DETECTED Final   Enterobacter cloacae complex NOT DETECTED NOT DETECTED Final   Escherichia coli NOT DETECTED NOT DETECTED Final   Klebsiella oxytoca NOT DETECTED NOT  DETECTED Final   Klebsiella pneumoniae NOT DETECTED NOT DETECTED Final   Proteus species NOT DETECTED NOT DETECTED Final   Serratia marcescens NOT DETECTED NOT DETECTED Final   Haemophilus influenzae NOT DETECTED NOT DETECTED Final   Neisseria meningitidis NOT DETECTED NOT DETECTED Final   Pseudomonas aeruginosa NOT DETECTED NOT DETECTED Final   Candida albicans NOT DETECTED NOT DETECTED Final   Candida glabrata NOT DETECTED NOT DETECTED Final   Candida krusei NOT DETECTED NOT DETECTED Final   Candida parapsilosis NOT DETECTED NOT DETECTED Final   Candida tropicalis NOT DETECTED NOT DETECTED Final  Urine culture     Status: None   Collection Time: 01/19/17  1:36 PM  Result Value Ref Range Status   Specimen Description URINE, RANDOM  Final   Special Requests NONE  Final   Culture   Final    NO GROWTH Performed at [facility], [address], [place], [place] [address]    Report Status 01/20/2017 FINAL  Final  MRSA PCR Screening     Status: None   Collection Time: 01/19/17  5:29 PM  Result Value Ref Range Status   MRSA by PCR NEGATIVE NEGATIVE Final    Comment:        The GeneXpert MRSA Assay (FDA approved for NASAL specimens only), is one component of a comprehensive MRSA colonization surveillance program. It is not intended to diagnose MRSA infection nor to guide or monitor treatment for MRSA infections.     IMAGING: Ct Chest Wo Contrast  Result Date: 01/19/2017 CLINICAL DATA:  The diaphoretic and tachycardic. Abdominal pain and diarrhea. EXAM: CT CHEST, ABDOMEN AND PELVIS WITHOUT CONTRAST TECHNIQUE: Multidetector CT imaging of the chest, abdomen and pelvis was performed following the standard protocol without IV contrast. COMPARISON:  Abdomen and pelvis CT 11/23/2012 FINDINGS: CT CHEST FINDINGS Cardiovascular: The heart size is normal. No pericardial effusion. Coronary artery calcification is noted. Atherosclerotic calcification is noted in the wall of the thoracic  aorta. Mediastinum/Nodes: No mediastinal lymphadenopathy. No evidence for gross hilar lymphadenopathy although assessment is limited by the lack of intravenous contrast on today's study. The esophagus has normal imaging features. There is no axillary lymphadenopathy. Lungs/Pleura: Assessment lung parenchyma is degraded by breathing motion. There is bibasilar atelectasis. Bronchial wall thickening is most evident in the lower lobes and areas of small airway impaction are seen in the lower lobes bilaterally. Probable scarring in the right middle lobe and lingula slightly progressive since prior abdomen and pelvis CT. No pulmonary edema or pleural effusion. Musculoskeletal: Old left seventh rib fracture noted. CT ABDOMEN  PELVIS FINDINGS Hepatobiliary: The liver shows diffusely decreased attenuation suggesting steatosis. Gallbladder is markedly distended. No intrahepatic or extrahepatic biliary dilation. Pancreas: Pancreas diffusely fatty replaced without focal mass lesion or dilatation of the main duct. Spleen: No splenomegaly. No focal mass lesion. Adrenals/Urinary Tract: No adrenal nodule or mass. Kidneys are unremarkable bilaterally. No evidence for hydroureter. The urinary bladder appears normal for the degree of distention. Stomach/Bowel: Stomach is nondistended. No gastric wall thickening. No evidence of outlet obstruction. Duodenum is normally positioned as is the ligament of Treitz. No small bowel wall thickening. No small bowel dilatation. Patient is status post right hemicolectomy. Diverticular changes are noted in the left colon without evidence of diverticulitis. Vascular/Lymphatic: There is abdominal aortic atherosclerosis without aneurysm. There is no gastrohepatic or hepatoduodenal ligament lymphadenopathy. No intraperitoneal or retroperitoneal lymphadenopathy. No pelvic sidewall lymphadenopathy. Reproductive: The prostate gland and seminal vesicles have normal imaging features. Other: No intraperitoneal  free fluid. Musculoskeletal: Bone windows reveal no worrisome lytic or sclerotic osseous lesions. Stable appearance L1 superior endplate compression deformity. IMPRESSION: 1. Bilateral posterior lower lobe collapse/consolidation with bronchial wall thickening and small airway impaction. These changes may be related to atypical infection or bronchopneumonia, but in the appropriate clinical setting, aspiration could have the same imaging findings. 2. Hepatic steatosis. 3. Marked gallbladder distention without biliary dilatation. 4. Status post right hemicolectomy without evidence for bowel obstruction or bowel wall thickening. No intraperitoneal free fluid. 5. Stable appearance L1 superior endplate compression deformity. Electronically Signed   By: Misty Stanley M.D.   On: 01/19/2017 14:51   US Renal  Result Date: 01/20/2017 CLINICAL DATA:  Acute kidney injury EXAM: RENAL / URINARY TRACT ULTRASOUND COMPLETE COMPARISON:  CT 01/19/2017 FINDINGS: Right Kidney: Length: 11.5 cm. Echogenicity within normal limits. No mass or hydronephrosis visualized. Left Kidney: Length: 0.4 cm. Echogenicity within normal limits. No mass or hydronephrosis visualized. Bladder: Appears normal for degree of bladder distention. IMPRESSION: Normal renal ultrasound Electronically Signed   By: Rolm Baptise M.D.   On: 01/20/2017 10:42   Dg Chest Portable 1 View  Result Date: 01/19/2017 CLINICAL DATA:  Diaphoresis and tachycardia. Recently diagnosed with hypocalcemia. History of traumatic brain injury. Current smoker. EXAM: PORTABLE CHEST 1 VIEW COMPARISON:  None in PACs FINDINGS: The lungs are adequately inflated. There is no focal infiltrate. There is no pleural effusion. The heart and pulmonary vascularity are normal. The mediastinum is normal in width. The bony thorax exhibits no acute abnormality. IMPRESSION: There is no acute cardiopulmonary abnormality. Electronically Signed   By: Cristian Grieves  [place] M.D.   On: 01/19/2017 13:41   Ct Renal  Stone Study  Result Date: 01/19/2017 CLINICAL DATA:  The diaphoretic and tachycardic. Abdominal pain and diarrhea. EXAM: CT CHEST, ABDOMEN AND PELVIS WITHOUT CONTRAST TECHNIQUE: Multidetector CT imaging of the chest, abdomen and pelvis was performed following the standard protocol without IV contrast. COMPARISON:  Abdomen and pelvis CT 11/23/2012 FINDINGS: CT CHEST FINDINGS Cardiovascular: The heart size is normal. No pericardial effusion. Coronary artery calcification is noted. Atherosclerotic calcification is noted in the wall of the thoracic aorta. Mediastinum/Nodes: No mediastinal lymphadenopathy. No evidence for gross hilar lymphadenopathy although assessment is limited by the lack of intravenous contrast on today's study. The esophagus has normal imaging features. There is no axillary lymphadenopathy. Lungs/Pleura: Assessment lung parenchyma is degraded by breathing motion. There is bibasilar atelectasis. Bronchial wall thickening is most evident in the lower lobes and areas of small airway impaction are seen in the lower lobes bilaterally. Probable scarring in the  right middle lobe and lingula slightly progressive since prior abdomen and pelvis CT. No pulmonary edema or pleural effusion. Musculoskeletal: Old left seventh rib fracture noted. CT ABDOMEN PELVIS FINDINGS Hepatobiliary: The liver shows diffusely decreased attenuation suggesting steatosis. Gallbladder is markedly distended. No intrahepatic or extrahepatic biliary dilation. Pancreas: Pancreas diffusely fatty replaced without focal mass lesion or dilatation of the main duct. Spleen: No splenomegaly. No focal mass lesion. Adrenals/Urinary Tract: No adrenal nodule or mass. Kidneys are unremarkable bilaterally. No evidence for hydroureter. The urinary bladder appears normal for the degree of distention. Stomach/Bowel: Stomach is nondistended. No gastric wall thickening. No evidence of outlet obstruction. Duodenum is normally positioned as is the  ligament of Treitz. No small bowel wall thickening. No small bowel dilatation. Patient is status post right hemicolectomy. Diverticular changes are noted in the left colon without evidence of diverticulitis. Vascular/Lymphatic: There is abdominal aortic atherosclerosis without aneurysm. There is no gastrohepatic or hepatoduodenal ligament lymphadenopathy. No intraperitoneal or retroperitoneal lymphadenopathy. No pelvic sidewall lymphadenopathy. Reproductive: The prostate gland and seminal vesicles have normal imaging features. Other: No intraperitoneal free fluid. Musculoskeletal: Bone windows reveal no worrisome lytic or sclerotic osseous lesions. Stable appearance L1 superior endplate compression deformity. IMPRESSION: 1. Bilateral posterior lower lobe collapse/consolidation with bronchial wall thickening and small airway impaction. These changes may be related to atypical infection or bronchopneumonia, but in the appropriate clinical setting, aspiration could have the same imaging findings. 2. Hepatic steatosis. 3. Marked gallbladder distention without biliary dilatation. 4. Status post right hemicolectomy without evidence for bowel obstruction or bowel wall thickening. No intraperitoneal free fluid. 5. Stable appearance L1 superior endplate compression deformity. Electronically Signed   By: Misty Stanley M.D.   On: 01/19/2017 14:51    Assessment:   Jacob Villanueva is a 66 y.o. male with TBI at facility , admitted with AMS, found down. He tells me today that he was vomiting at home but unclear if he is an accurate historian. On admit wbc 24, temp 99.  UA negative. CT chest with mild bilat consolidation collapse and bronchial wall thickening. CT abd with marked GB thicening. AST eleveated. Cr 3.6 but downt o 1.8 Hypoxic so placed on O2. Clinically improved and transferred to floor today. WBC down to 15.  MRA PCR neg, 1/2 BCX + Staph species.   His CT chest is not very impressive but does have marked gallbladder  on CT and has some abd pain and elevated AST.   He will need further evaluation of his gallbladder- spoke to Dr Gladstone Lighter.   Recommendations Cont cefepime Add flagyl Check RUQ USS and follow LFTs Consider surgery consult.  Monitor for acalculous cholecystitis.  Thank you very much for allowing me to participate in the care of this patient. Please call with questions.   Cheral Marker. Ola Spurr, MD

## 2017-01-20 NOTE — Progress Notes (Signed)
PHARMACY - PHYSICIAN COMMUNICATION CRITICAL VALUE ALERT - BLOOD CULTURE IDENTIFICATION (BCID)  Results for orders placed or performed during the hospital encounter of 01/19/17  Blood Culture ID Panel (Reflexed) (Collected: 01/19/2017 12:46 PM)  Result Value Ref Range   Enterococcus species NOT DETECTED NOT DETECTED   Listeria monocytogenes NOT DETECTED NOT DETECTED   Staphylococcus species DETECTED (A) NOT DETECTED   Staphylococcus aureus NOT DETECTED NOT DETECTED   Methicillin resistance NOT DETECTED NOT DETECTED   Streptococcus species NOT DETECTED NOT DETECTED   Streptococcus agalactiae NOT DETECTED NOT DETECTED   Streptococcus pneumoniae NOT DETECTED NOT DETECTED   Streptococcus pyogenes NOT DETECTED NOT DETECTED   Acinetobacter baumannii NOT DETECTED NOT DETECTED   Enterobacteriaceae species NOT DETECTED NOT DETECTED   Enterobacter cloacae complex NOT DETECTED NOT DETECTED   Escherichia coli NOT DETECTED NOT DETECTED   Klebsiella oxytoca NOT DETECTED NOT DETECTED   Klebsiella pneumoniae NOT DETECTED NOT DETECTED   Proteus species NOT DETECTED NOT DETECTED   Serratia marcescens NOT DETECTED NOT DETECTED   Haemophilus influenzae NOT DETECTED NOT DETECTED   Neisseria meningitidis NOT DETECTED NOT DETECTED   Pseudomonas aeruginosa NOT DETECTED NOT DETECTED   Candida albicans NOT DETECTED NOT DETECTED   Candida glabrata NOT DETECTED NOT DETECTED   Candida krusei NOT DETECTED NOT DETECTED   Candida parapsilosis NOT DETECTED NOT DETECTED   Candida tropicalis NOT DETECTED NOT DETECTED    Name of physician (or Provider) Contacted: Sainani   Changes to prescribed antibiotics required: Continue Cefepime. Continue to monitor.   Shawnette Augello L 01/20/2017  4:44 PM

## 2017-01-20 NOTE — Progress Notes (Signed)
Pharmacy Antibiotic Note  Nicoletta Balex Gillen is a 66 y.o. male admitted on 01/19/2017 with sepsis.  Pharmacy has been consulted for cefepeime dosing. Patient being monitored for cholecystitis. Patient with history of TBI. Patient found down at facility with AMS.   Plan: Cefepime 1g IV Q24hr.   Height: 6\' 4"  (193 cm) Weight: 195 lb 12.3 oz (88.8 kg) IBW/kg (Calculated) : 86.8  Temp (24hrs), Avg:98.6 F (37 C), Min:98.2 F (36.8 C), Max:99.2 F (37.3 C)   Recent Labs Lab 01/19/17 1238 01/19/17 1239 01/19/17 1517 01/19/17 1759 01/19/17 2148 01/20/17 0508  WBC  --  24.0*  --   --   --  15.7*  CREATININE  --  3.63*  --   --   --  1.82*  LATICACIDVEN 5.5*  --  3.5* 2.4* 1.5  --     Estimated Creatinine Clearance: 47.7 mL/min (A) (by C-G formula based on SCr of 1.82 mg/dL (H)).    Allergies  Allergen Reactions  . Effexor [Venlafaxine Hcl]     DELUSIONAL   . Amoxicillin Rash    Antimicrobials this admission: Vancomycin 6/7 >> 6/8 Cefepime 6/7 >>   Dose adjustments this admission: N/A  Microbiology results: 6/7 BCx: GPC, Staph Species MEC A negative  6/7 UCx: no growth  6/7 MRSA PCR: negative   Thank you for allowing pharmacy to be a part of this patient's care.  Zackarey Holleman L 01/20/2017 4:46 PM

## 2017-01-20 NOTE — Progress Notes (Signed)
PT Cancellation Note  Patient Details Name: Nicoletta Balex Cooprider MRN: [id number] DOB: 05/05/1951   Cancelled Treatment:    Reason Eval/Treat Not Completed: Medical issues which prohibited therapy Pt's lab values prohibit PT at this time.  Ca+ 7.2, K+ also low and trending down.  Will hold PT today.  Malachi ProGalen R Franceska Strahm, DPT 01/20/2017, 4:39 PM

## 2017-01-20 NOTE — Progress Notes (Signed)
[facility] - [place] [facility]   PATIENT NAME: Jacob Villanueva    MR#:  [id number]  DATE OF BIRTH:  Dec 25, 1950  SUBJECTIVE:   Patient here due to altered mental status and unresponsiveness suspected to be secondary to sepsis. Much improved today. Mental status improved, off vasopressors. Being transferred to floor today from ICU.  REVIEW OF SYSTEMS:    Review of Systems  Constitutional: Negative for chills and fever.  HENT: Negative for congestion and tinnitus.   Eyes: Negative for blurred vision and double vision.  Respiratory: Negative for cough, shortness of breath and wheezing.   Cardiovascular: Negative for chest pain, orthopnea and PND.  Gastrointestinal: Negative for abdominal pain, diarrhea, nausea and vomiting.  Genitourinary: Negative for dysuria and hematuria.  Neurological: Positive for weakness. Negative for dizziness, sensory change and focal weakness.  All other systems reviewed and are negative.   Nutrition: Soft diet Tolerating Diet: Yes Tolerating PT: Await Eval.   DRUG ALLERGIES:   Allergies  Allergen Reactions  . Effexor [Venlafaxine Hcl]     DELUSIONAL   . Amoxicillin Rash    VITALS:  Blood pressure 104/69, pulse 82, temperature 99.2 F (37.3 C), temperature source Oral, resp. rate 15, height 6\' 4"  (1.93 m), weight 88.8 kg (195 lb 12.3 oz), SpO2 (!) 88 %.  PHYSICAL EXAMINATION:   Physical Exam  GENERAL:  66 y.o.-year-old patient lying in bed in no acute distress.  EYES: Pupils equal, round, reactive to light and accommodation. No scleral icterus. Extraocular muscles intact.  HEENT: Head atraumatic, normocephalic. Oropharynx and nasopharynx clear.  NECK:  Supple, no jugular venous distention. No thyroid enlargement, no tenderness.  LUNGS: Poor Resp. effort, no wheezing, rales, rhonchi. No use of accessory muscles of respiration.  CARDIOVASCULAR: S1, S2 normal. No murmurs, rubs, or gallops.  ABDOMEN: Soft, nontender, nondistended.  Bowel sounds present. No organomegaly or mass.  EXTREMITIES: No cyanosis, clubbing or edema b/l.    NEUROLOGIC: Cranial nerves II through XII are intact. No focal Motor or sensory deficits b/l. Globally weak.   PSYCHIATRIC: The patient is alert and oriented x 1.  SKIN: No obvious rash, lesion, or ulcer.    LABORATORY PANEL:   CBC  Recent Labs Lab 01/20/17 0508  WBC 15.7*  HGB 12.6*  HCT 36.4*  PLT 176   ------------------------------------------------------------------------------------------------------------------  Chemistries   Recent Labs Lab 01/20/17 0508 01/20/17 0619  NA 132*  --   K 2.8*  --   CL 102  --   CO2 23  --   GLUCOSE 107*  --   BUN 25*  --   CREATININE 1.82*  --   CALCIUM 7.2*  --   MG  --  1.8  AST 375*  --   ALT 58  --   ALKPHOS 57  --   BILITOT 1.0  --    ------------------------------------------------------------------------------------------------------------------  Cardiac Enzymes  Recent Labs Lab 01/19/17 1239  TROPONINI 0.05*   ------------------------------------------------------------------------------------------------------------------  RADIOLOGY:  Ct Chest Wo Contrast  Result Date: 01/19/2017 CLINICAL DATA:  The diaphoretic and tachycardic. Abdominal pain and diarrhea. EXAM: CT CHEST, ABDOMEN AND PELVIS WITHOUT CONTRAST TECHNIQUE: Multidetector CT imaging of the chest, abdomen and pelvis was performed following the standard protocol without IV contrast. COMPARISON:  Abdomen and pelvis CT 11/23/2012 FINDINGS: CT CHEST FINDINGS Cardiovascular: The heart size is normal. No pericardial effusion. Coronary artery calcification is noted. Atherosclerotic calcification is noted in the wall of the thoracic aorta. Mediastinum/Nodes: No mediastinal lymphadenopathy. No evidence for gross hilar  lymphadenopathy although assessment is limited by the lack of intravenous contrast on today's study. The esophagus has normal imaging features. There is  no axillary lymphadenopathy. Lungs/Pleura: Assessment lung parenchyma is degraded by breathing motion. There is bibasilar atelectasis. Bronchial wall thickening is most evident in the lower lobes and areas of small airway impaction are seen in the lower lobes bilaterally. Probable scarring in the right middle lobe and lingula slightly progressive since prior abdomen and pelvis CT. No pulmonary edema or pleural effusion. Musculoskeletal: Old left seventh rib fracture noted. CT ABDOMEN PELVIS FINDINGS Hepatobiliary: The liver shows diffusely decreased attenuation suggesting steatosis. Gallbladder is markedly distended. No intrahepatic or extrahepatic biliary dilation. Pancreas: Pancreas diffusely fatty replaced without focal mass lesion or dilatation of the main duct. Spleen: No splenomegaly. No focal mass lesion. Adrenals/Urinary Tract: No adrenal nodule or mass. Kidneys are unremarkable bilaterally. No evidence for hydroureter. The urinary bladder appears normal for the degree of distention. Stomach/Bowel: Stomach is nondistended. No gastric wall thickening. No evidence of outlet obstruction. Duodenum is normally positioned as is the ligament of Treitz. No small bowel wall thickening. No small bowel dilatation. Patient is status post right hemicolectomy. Diverticular changes are noted in the left colon without evidence of diverticulitis. Vascular/Lymphatic: There is abdominal aortic atherosclerosis without aneurysm. There is no gastrohepatic or hepatoduodenal ligament lymphadenopathy. No intraperitoneal or retroperitoneal lymphadenopathy. No pelvic sidewall lymphadenopathy. Reproductive: The prostate gland and seminal vesicles have normal imaging features. Other: No intraperitoneal free fluid. Musculoskeletal: Bone windows reveal no worrisome lytic or sclerotic osseous lesions. Stable appearance L1 superior endplate compression deformity. IMPRESSION: 1. Bilateral posterior lower lobe collapse/consolidation with  bronchial wall thickening and small airway impaction. These changes may be related to atypical infection or bronchopneumonia, but in the appropriate clinical setting, aspiration could have the same imaging findings. 2. Hepatic steatosis. 3. Marked gallbladder distention without biliary dilatation. 4. Status post right hemicolectomy without evidence for bowel obstruction or bowel wall thickening. No intraperitoneal free fluid. 5. Stable appearance L1 superior endplate compression deformity. Electronically Signed   By: Kennith Center M.D.   On: 01/19/2017 14:51   US Renal  Result Date: 01/20/2017 CLINICAL DATA:  Acute kidney injury EXAM: RENAL / URINARY TRACT ULTRASOUND COMPLETE COMPARISON:  CT 01/19/2017 FINDINGS: Right Kidney: Length: 11.5 cm. Echogenicity within normal limits. No mass or hydronephrosis visualized. Left Kidney: Length: 0.4 cm. Echogenicity within normal limits. No mass or hydronephrosis visualized. Bladder: Appears normal for degree of bladder distention. IMPRESSION: Normal renal ultrasound Electronically Signed   By: Charlett Nose M.D.   On: 01/20/2017 10:42   Dg Chest Portable 1 View  Result Date: 01/19/2017 CLINICAL DATA:  Diaphoresis and tachycardia. Recently diagnosed with hypocalcemia. History of traumatic brain injury. Current smoker. EXAM: PORTABLE CHEST 1 VIEW COMPARISON:  None in PACs FINDINGS: The lungs are adequately inflated. There is no focal infiltrate. There is no pleural effusion. The heart and pulmonary vascularity are normal. The mediastinum is normal in width. The bony thorax exhibits no acute abnormality. IMPRESSION: There is no acute cardiopulmonary abnormality. Electronically Signed   By: David  [place] M.D.   On: 01/19/2017 13:41   Ct Renal Stone Study  Result Date: 01/19/2017 CLINICAL DATA:  The diaphoretic and tachycardic. Abdominal pain and diarrhea. EXAM: CT CHEST, ABDOMEN AND PELVIS WITHOUT CONTRAST TECHNIQUE: Multidetector CT imaging of the chest, abdomen and  pelvis was performed following the standard protocol without IV contrast. COMPARISON:  Abdomen and pelvis CT 11/23/2012 FINDINGS: CT CHEST FINDINGS Cardiovascular: The heart size is  normal. No pericardial effusion. Coronary artery calcification is noted. Atherosclerotic calcification is noted in the wall of the thoracic aorta. Mediastinum/Nodes: No mediastinal lymphadenopathy. No evidence for gross hilar lymphadenopathy although assessment is limited by the lack of intravenous contrast on today's study. The esophagus has normal imaging features. There is no axillary lymphadenopathy. Lungs/Pleura: Assessment lung parenchyma is degraded by breathing motion. There is bibasilar atelectasis. Bronchial wall thickening is most evident in the lower lobes and areas of small airway impaction are seen in the lower lobes bilaterally. Probable scarring in the right middle lobe and lingula slightly progressive since prior abdomen and pelvis CT. No pulmonary edema or pleural effusion. Musculoskeletal: Old left seventh rib fracture noted. CT ABDOMEN PELVIS FINDINGS Hepatobiliary: The liver shows diffusely decreased attenuation suggesting steatosis. Gallbladder is markedly distended. No intrahepatic or extrahepatic biliary dilation. Pancreas: Pancreas diffusely fatty replaced without focal mass lesion or dilatation of the main duct. Spleen: No splenomegaly. No focal mass lesion. Adrenals/Urinary Tract: No adrenal nodule or mass. Kidneys are unremarkable bilaterally. No evidence for hydroureter. The urinary bladder appears normal for the degree of distention. Stomach/Bowel: Stomach is nondistended. No gastric wall thickening. No evidence of outlet obstruction. Duodenum is normally positioned as is the ligament of Treitz. No small bowel wall thickening. No small bowel dilatation. Patient is status post right hemicolectomy. Diverticular changes are noted in the left colon without evidence of diverticulitis. Vascular/Lymphatic: There is  abdominal aortic atherosclerosis without aneurysm. There is no gastrohepatic or hepatoduodenal ligament lymphadenopathy. No intraperitoneal or retroperitoneal lymphadenopathy. No pelvic sidewall lymphadenopathy. Reproductive: The prostate gland and seminal vesicles have normal imaging features. Other: No intraperitoneal free fluid. Musculoskeletal: Bone windows reveal no worrisome lytic or sclerotic osseous lesions. Stable appearance L1 superior endplate compression deformity. IMPRESSION: 1. Bilateral posterior lower lobe collapse/consolidation with bronchial wall thickening and small airway impaction. These changes may be related to atypical infection or bronchopneumonia, but in the appropriate clinical setting, aspiration could have the same imaging findings. 2. Hepatic steatosis. 3. Marked gallbladder distention without biliary dilatation. 4. Status post right hemicolectomy without evidence for bowel obstruction or bowel wall thickening. No intraperitoneal free fluid. 5. Stable appearance L1 superior endplate compression deformity. Electronically Signed   By: Kennith CenterEric  Mansell M.D.   On: 01/19/2017 14:51     ASSESSMENT AND PLAN:   66 year old male with past medical history of traumatic brain injury, hypertension [facility] hospital due to altered mental status and unresponsiveness and noted to be hypoxic.  1. Altered mental status/encephalopathy-secondary to sepsis. -Patient was admitted to the ICU, given IV fluids, IV vasopressors, broad-spectrum IV antibiotics and has clinically improved. His mental status has improved and is close to baseline presently.  2. Sepsis-secondary to suspected pneumonia. There was some diarrhea on admission but this has resolved. -Cultures so far have been negative. Patient did have an elevated lactic acid and pro-calcitonin have leukocytosis. -Continue IV cefepime, will DC vancomycin as MRSA PCR is negative. -Follow  hemodynamics.  3. Acute kidney injury-secondary to sepsis and dehydration.  -renal ultrasound negative for any acute pathology. Improved with IV fluids, will follow BUN/creatinine urine output. Renal dose meds, avoid nephrotoxins.  4. Hypokalemia-we'll replace potassium intravenously and orally. Magnesium level normal. -Follow potassium level.  5. Leukocytosis-secondary to sepsis. Improving with IV antibiotic therapy. Will monitor.  6. GERD-continue Protonix.  7. Depression-continue Zoloft.  8. History of gout-no acute attack, continue allopurinol   All the records are reviewed and case discussed with Care Management/Social  Worker. Management plans discussed with the patient, family and they are in agreement.  CODE STATUS: Full  DVT Prophylaxis: Lovenox  TOTAL TIME TAKING CARE OF THIS PATIENT: 30 minutes.   POSSIBLE D/C IN 2-3 DAYS, DEPENDING ON CLINICAL CONDITION.   Houston Siren M.D on 01/20/2017 at 2:27 PM  Between 7am to 6pm - Pager - [phone]  After 6pm go to [email or web address] - Social research officer, government  [facility]  Hospitalists  Office  [phone]  CC: Primary care physician; Patient, No Pcp Per

## 2017-01-20 NOTE — Progress Notes (Signed)
MEDICATION RELATED CONSULT NOTE   Pharmacy Consult for electrolyte management  Indication: hypokalemia   Pharmacy consulted for electrolyte management for 66 yo male being treated for hypokalemia. Patient admitted with N/V/D. Patient receiving NS/20KCl at 11125mL/hr, has received 50mEq of KCl IV replacement in past 24 hours and is ordered potassium 40mEq PO TID x 3 dose.   Plan:  Will replace magnesium 2g IV x 1 for goal magnesium > 2. Will recheck electrolytes with am labs.   Allergies  Allergen Reactions  . Effexor [Venlafaxine Hcl]     DELUSIONAL   . Amoxicillin Rash    Patient Measurements: Height: 6\' 4"  (193 cm) Weight: 195 lb 12.3 oz (88.8 kg) IBW/kg (Calculated) : 86.8   Vital Signs: Temp: 98.2 F (36.8 C) (06/08 1429) Temp Source: Oral (06/08 1429) BP: 123/66 (06/08 1429) Pulse Rate: 81 (06/08 1429) Intake/Output from previous day: 06/07 0701 - 06/08 0700 In: 3262.9 [P.O.:240; I.V.:1322.9; IV Piggyback:1700] Out: 575 [Urine:575] Intake/Output from this shift: Total I/O In: -  Out: 250 [Urine:250]  Labs:  Recent Labs  01/19/17 1239 01/20/17 0508 01/20/17 0619  WBC 24.0* 15.7*  --   HGB 16.8 12.6*  --   HCT 49.1 36.4*  --   PLT 264 176  --   CREATININE 3.63* 1.82*  --   MG 2.4  --  1.8  ALBUMIN 4.6 3.2*  --   PROT 8.2* 5.6*  --   AST 126* 375*  --   ALT 25 58  --   ALKPHOS 88 57  --   BILITOT 1.8* 1.0  --    Estimated Creatinine Clearance: 47.7 mL/min (A) (by C-G formula based on SCr of 1.82 mg/dL (H)).   Pharmacy will continue to monitor and adjust per consult.   Jacob Villanueva 01/20/2017,4:57 PM

## 2017-01-21 ENCOUNTER — Inpatient Hospital Stay: Payer: Medicare Other

## 2017-01-21 LAB — BASIC METABOLIC PANEL
ANION GAP: 8 (ref 5–15)
BUN: 14 mg/dL (ref 6–20)
CALCIUM: 8.1 mg/dL — AB (ref 8.9–10.3)
CO2: 25 mmol/L (ref 22–32)
CREATININE: 0.89 mg/dL (ref 0.61–1.24)
Chloride: 102 mmol/L (ref 101–111)
GFR calc non Af Amer: >60 mL/min (ref >60)
Glucose, Bld: 106 mg/dL — ABNORMAL HIGH (ref 65–99)
Potassium: 3.4 mmol/L — ABNORMAL LOW (ref 3.5–5.1)
SODIUM: 135 mmol/L (ref 135–145)

## 2017-01-21 LAB — PHOSPHORUS: PHOSPHORUS: 1.7 mg/dL — AB (ref 2.5–4.6)

## 2017-01-21 LAB — MAGNESIUM: MAGNESIUM: 1.8 mg/dL (ref 1.7–2.4)

## 2017-01-21 MED ORDER — POTASSIUM CHLORIDE CRYS ER 20 MEQ PO TBCR
20.0000 meq | EXTENDED_RELEASE_TABLET | Freq: Two times a day (BID) | ORAL | Status: DC
  Administered 2017-01-21 – 2017-01-22 (×3): 20 meq via ORAL
  Filled 2017-01-21 (×3): qty 1

## 2017-01-21 MED ORDER — DEXTROSE 5 % IV SOLN
20.0000 mmol | Freq: Once | INTRAVENOUS | Status: AC
  Administered 2017-01-21: 14:00:00 20 mmol via INTRAVENOUS
  Filled 2017-01-21: qty 6.67

## 2017-01-21 MED ORDER — PROMETHAZINE HCL 25 MG/ML IJ SOLN
12.5000 mg | Freq: Once | INTRAMUSCULAR | Status: AC
  Administered 2017-01-21: 12.5 mg via INTRAVENOUS
  Filled 2017-01-21: qty 1

## 2017-01-21 MED ORDER — MAGNESIUM SULFATE 2 GM/50ML IV SOLN
2.0000 g | Freq: Once | INTRAVENOUS | Status: AC
  Administered 2017-01-21: 13:00:00 2 g via INTRAVENOUS
  Filled 2017-01-21: qty 50

## 2017-01-21 MED ORDER — DEXTROSE 5 % IV SOLN
1.0000 g | Freq: Two times a day (BID) | INTRAVENOUS | Status: DC
  Administered 2017-01-21: 1 g via INTRAVENOUS
  Filled 2017-01-21 (×3): qty 1

## 2017-01-21 NOTE — Progress Notes (Addendum)
Pharmacy Antibiotic Note  Jacob Villanueva is a 66 y.o. male admitted on 01/19/2017 with sepsis.  Pharmacy has been consulted for cefepeime dosing. Patient being monitored for cholecystitis. Patient with history of TBI. Patient found down at facility with AMS.   Plan: Renal function much improved, will adjust cefepime to 1gm IV Q8H   Height: 6\' 4"  (193 cm) Weight: 195 lb 12.3 oz (88.8 kg) IBW/kg (Calculated) : 86.8  Temp (24hrs), Avg:98.2 F (36.8 C), Min:97.7 F (36.5 C), Max:98.7 F (37.1 C)   Recent Labs Lab 01/19/17 1238 01/19/17 1239 01/19/17 1517 01/19/17 1759 01/19/17 2148 01/20/17 0508 01/21/17 1010  WBC  --  24.0*  --   --   --  15.7*  --   CREATININE  --  3.63*  --   --   --  1.82* 0.89  LATICACIDVEN 5.5*  --  3.5* 2.4* 1.5  --   --     Estimated Creatinine Clearance: 97.5 mL/min (by C-G formula based on SCr of 0.89 mg/dL).    Allergies  Allergen Reactions  . Clindamycin/Lincomycin Diarrhea  . Effexor [Venlafaxine Hcl]     DELUSIONAL   . Amoxicillin Rash    Antimicrobials this admission: Vancomycin 6/7 >> 6/8 Cefepime 6/7 >>   Dose adjustments this admission: N/A  Microbiology results: 6/7 BCx: GPC, Staph Species MEC A negative  6/7 UCx: no growth  6/7 MRSA PCR: negative   Thank you for allowing pharmacy to be a part of this patient's care.  Annissa Andreoni C 01/21/2017 12:04 PM

## 2017-01-21 NOTE — Progress Notes (Signed)
MEDICATION RELATED CONSULT NOTE   Pharmacy Consult for electrolyte management  Indication: hypokalemia   Pharmacy consulted for electrolyte management for 66 yo male being treated for hypokalemia.   Potassium and phos low this morning, replaced by MD  Plan:  No additional supplementation at this time, will recheck electrolytes with AM labs  Allergies  Allergen Reactions  . Clindamycin/Lincomycin Diarrhea  . Effexor [Venlafaxine Hcl]     DELUSIONAL   . Amoxicillin Rash    Patient Measurements: Height: 6\' 4"  (193 cm) Weight: 195 lb 12.3 oz (88.8 kg) IBW/kg (Calculated) : 86.8   Vital Signs: Temp: 97.7 F (36.5 C) (06/09 0650) Temp Source: Oral (06/09 0650) BP: 107/64 (06/09 0650) Pulse Rate: 78 (06/09 0650) Intake/Output from previous day: 06/08 0701 - 06/09 0700 In: 100 [IV Piggyback:100] Out: 1350 [Urine:1350] Intake/Output from this shift: No intake/output data recorded.  Labs:  Recent Labs  01/19/17 1239 01/20/17 0508 01/20/17 0619 01/21/17 1010  WBC 24.0* 15.7*  --   --   HGB 16.8 12.6*  --   --   HCT 49.1 36.4*  --   --   PLT 264 176  --   --   CREATININE 3.63* 1.82*  --  0.89  MG 2.4  --  1.8 1.8  PHOS  --   --   --  1.7*  ALBUMIN 4.6 3.2*  --   --   PROT 8.2* 5.6*  --   --   AST 126* 375*  --   --   ALT 25 58  --   --   ALKPHOS 88 57  --   --   BILITOT 1.8* 1.0  --   --    Estimated Creatinine Clearance: 97.5 mL/min (by C-G formula based on SCr of 0.89 mg/dL).   Pharmacy will continue to monitor and adjust per consult.   Kleo Dungee C 01/21/2017,12:09 PM

## 2017-01-21 NOTE — Evaluation (Signed)
Physical Therapy Evaluation Patient Details Name: Jacob Villanueva Mostafa MRN: [id number] DOB: 02/13/1951 Today's Date: 01/21/2017   History of Present Illness  Pt is a 66 y.o. male presenting to hospital with decreased level of responsiveness and general ill appearance.  CT chest showing B lower lobe consolidation vs atelectasis.  Pt admitted with acute hypoxic respiratory failure secondary ?PNA, acute renal failure, diarrhea, and AMS.  PMH includes TBI, htn, h/o fx surgery, and appendectomy.  Clinical Impression  Prior to hospital admission, pt reports being independent.  [facility]  Currently pt is SBA with bed mobility, CGA with transfers, and CGA to min assist with ambulation 120 feet using RW.  Pt ambulating with narrow BOS and with intermittent loss of balance to R requiring min assist to steady; pt also appearing impulsive and requiring assist for safety with RW use.  Pt leaning forward grabbing onto items for support and c/o weakness so utilized RW for functional mobility during session.  Pt would benefit from skilled PT to address noted impairments and functional limitations (see below for any additional details).  Upon hospital discharge, recommend pt discharge to [facility].    Follow Up Recommendations SNF    Equipment Recommendations  Rolling walker with 5" wheels    Recommendations for Other Services       Precautions / Restrictions Precautions Precautions: Fall Restrictions Weight Bearing Restrictions: No      Mobility  Bed Mobility Overal bed mobility: Needs Assistance Bed Mobility: Supine to Sit;Sit to Supine     Supine to sit: Supervision;HOB elevated Sit to supine: Supervision;HOB elevated   General bed mobility comments: SBA for safety d/t impulsiveness.  Transfers Overall transfer level: Needs assistance Equipment used: Rolling walker (2 wheeled);None Transfers: Sit to/from Stand Sit to Stand: Min guard         General transfer comment: CGA  for safety (pt leaning forward grabbing for something to hold onto so pt given RW for support)  Ambulation/Gait Ambulation/Gait assistance: Min guard;Min assist Ambulation Distance (Feet): 120 Feet Assistive device: Rolling walker (2 wheeled)   Gait velocity: mildly decreased   General Gait Details: Narrow BOS, intermittent R lean requiring min assist to steady; impulsive leaving walker behind and grabbing onto objects for support last 5 feet walking back to bed; vc's to stay closer and within RW during ambulation  Stairs            Wheelchair Mobility    Modified Rankin (Stroke Patients Only)       Balance Overall balance assessment: Needs assistance Sitting-balance support: No upper extremity supported;Feet supported Sitting balance-Leahy Scale: Good Sitting balance - Comments: sitting reaching within BOS   Standing balance support: Bilateral upper extremity supported;During functional activity (on RW) Standing balance-Leahy Scale: Poor Standing balance comment: intermittent R lateral lean requiring min assist to steady                             Pertinent Vitals/Pain Pain Assessment: No/denies pain  Vitals (HR and O2 on 2 L O2 via nasal cannula) stable and WFL throughout treatment session.     Home Living Family/patient expects to be discharged to:: Other (Comment)                 [facility]    Prior Function Level of Independence: Independent         Comments: Pt reports being independent and denies any  falls in past 6 months.     Hand Dominance        Extremity/Trunk Assessment   Upper Extremity Assessment Upper Extremity Assessment: Generalized weakness    Lower Extremity Assessment Lower Extremity Assessment: Generalized weakness       Communication   Communication: No difficulties  Cognition Arousal/Alertness: Awake/alert Behavior During Therapy: WFL for tasks  assessed/performed Overall Cognitive Status: No family/caregiver present to determine baseline cognitive functioning (Oriented to person, place, and situation)                                        General Comments   Nursing cleared pt for participation in physical therapy.  Pt agreeable to PT session.  Pt incontinent of urine upon PT attempting to get pt OOB (NA came to assist with clean-up and pt stood holding onto counter for support for clean-up).  Aide found toenail in bed when changing linen so PT inspected pt's feet (socks on upon PT entering room) and found that L great toe missing toe-nail (nursing notified and came to inspect pt's toe).    Exercises     Assessment/Plan    PT Assessment Patient needs continued PT services  PT Problem List Decreased strength;Decreased balance;Decreased mobility       PT Treatment Interventions DME instruction;Gait training;Functional mobility training;Therapeutic activities;Therapeutic exercise;Balance training;Patient/family education    PT Goals (Current goals can be found in the Care Plan section)  Acute Rehab PT Goals Patient Stated Goal: to eat soon PT Goal Formulation: With patient Time For Goal Achievement: 02/04/17 Potential to Achieve Goals: Good    Frequency Min 2X/week   Barriers to discharge Decreased caregiver support      Co-evaluation               AM-PAC PT "6 Clicks" Daily Activity  Outcome Measure Difficulty turning over in bed (including adjusting bedclothes, sheets and blankets)?: None Difficulty moving from lying on back to sitting on the side of the bed? : None Difficulty sitting down on and standing up from a chair with arms (e.g., wheelchair, bedside commode, etc,.)?: A Little Help needed moving to and from a bed to chair (including a wheelchair)?: A Little Help needed walking in hospital room?: A Little Help needed climbing 3-5 steps with a railing? : A Little 6 Click Score: 20     End of Session Equipment Utilized During Treatment: Gait belt;Oxygen (2 L O2 via nasal cannula) Activity Tolerance: Patient tolerated treatment well Patient left: in bed;with call bell/phone within reach;with bed alarm set (fall mat on floor; tele-sitter in room) Nurse Communication: Mobility status;Precautions PT Visit Diagnosis: Unsteadiness on feet (R26.81);Muscle weakness (generalized) (M62.81);Difficulty in walking, not elsewhere classified (R26.2)    Time: 1610-9604 PT Time Calculation (min) (ACUTE ONLY): 30 min   Charges:   PT Evaluation $PT Eval Low Complexity: 1 Procedure PT Treatments $Therapeutic Activity: 8-22 mins   PT G CodesHendricks Limes, PT 01/21/17, 12:37 PM [phone]

## 2017-01-21 NOTE — Progress Notes (Signed)
Patient ID: Jacob Villanueva, male   DOB: 12/05/1950, 66 y.o.   MRN: [id number]  [facility] PROGRESS NOTE  Jacob Villanueva YNW:[id number] DOB: 10/23/1950 DOA: 01/19/2017 PCP: Patient, No Pcp Per  HPI/Subjective: Patient feels okay. Complaining that he wasn't getting any food. He states that he has diarrhea but then stated that his last episode was diarrhea was 2 days ago.  Objective: Vitals:   01/21/17 0650 01/21/17 1217  BP: 107/64 113/79  Pulse: 78 66  Resp: 20 18  Temp: 97.7 F (36.5 C) 97.8 F (36.6 C)    Filed Weights   01/19/17 1258 01/19/17 1800  Weight: 90.3 kg (199 lb) 88.8 kg (195 lb 12.3 oz)    ROS: Review of Systems  Constitutional: Negative for chills and fever.  Eyes: Negative for blurred vision.  Respiratory: Positive for cough. Negative for shortness of breath.   Cardiovascular: Negative for chest pain.  Gastrointestinal: Positive for diarrhea. Negative for abdominal pain, constipation, nausea and vomiting.  Genitourinary: Negative for dysuria.  Musculoskeletal: Negative for joint pain.  Neurological: Negative for dizziness and headaches.   Exam: Physical Exam  HENT:  Nose: No mucosal edema.  Mouth/Throat: No oropharyngeal exudate or posterior oropharyngeal edema.  Eyes: Conjunctivae, EOM and lids are normal. Pupils are equal, round, and reactive to light.  Neck: No JVD present. Carotid bruit is not present. No edema present. No thyroid mass and no thyromegaly present.  Cardiovascular: S1 normal, S2 normal and normal heart sounds.  Exam reveals no gallop.   No murmur heard. Pulses:      Dorsalis pedis pulses are 2+ on the right side, and 2+ on the left side.  Respiratory: No respiratory distress. He has no wheezes. He has no rhonchi. He has no rales.  GI: Soft. Bowel sounds are normal. There is no tenderness.  Musculoskeletal:       Right ankle: He exhibits no swelling.       Left ankle: He exhibits no swelling.  Lymphadenopathy:    He has no cervical adenopathy.   Neurological: He is alert. No cranial nerve deficit.  Skin: Skin is warm. No rash noted. Nails show no clubbing.  Psychiatric: He has a normal mood and affect.      Data Reviewed: Basic Metabolic Panel:  Recent Labs Lab 01/19/17 1239 01/19/17 2148 01/20/17 0508 01/20/17 0619 01/21/17 1010  NA 140  --  132*  --  135  K 2.6* 2.9* 2.8*  --  3.4*  CL 98*  --  102  --  102  CO2 23  --  23  --  25  GLUCOSE 179*  --  107*  --  106*  BUN 28*  --  25*  --  14  CREATININE 3.63*  --  1.82*  --  0.89  CALCIUM 9.5  --  7.2*  --  8.1*  MG 2.4  --   --  1.8 1.8  PHOS  --   --   --   --  1.7*   Liver Function Tests:  Recent Labs Lab 01/19/17 1239 01/20/17 0508  AST 126* 375*  ALT 25 58  ALKPHOS 88 57  BILITOT 1.8* 1.0  PROT 8.2* 5.6*  ALBUMIN 4.6 3.2*    Recent Labs Lab 01/19/17 1239  LIPASE 53*    Recent Labs Lab 01/19/17 1806  AMMONIA 18   CBC:  Recent Labs Lab 01/19/17 1239 01/20/17 0508  WBC 24.0* 15.7*  NEUTROABS 21.3*  --   HGB 16.8 12.6*  HCT 49.1 36.4*  MCV 92.6 92.8  PLT 264 176   Cardiac Enzymes:  Recent Labs Lab 01/19/17 1239  TROPONINI 0.05*   BNP (last 3 results)  Recent Labs  01/19/17 1239  BNP 28.0    ProBNP (last 3 results) No results for input(s): PROBNP in the last 8760 hours.  CBG:  Recent Labs Lab 01/19/17 1758  GLUCAP 156*    Recent Results (from the past 240 hour(s))  Culture, blood (Routine x 2)     Status: None (Preliminary result)   Collection Time: 01/19/17 12:46 PM  Result Value Ref Range Status   Specimen Description BLOOD R HAND  Final   Special Requests   Final    BOTTLES DRAWN AEROBIC AND ANAEROBIC Blood Culture results may not be optimal due to an excessive volume of blood received in culture bottles   Culture  Setup Time   Final    GRAM POSITIVE COCCI IN BOTH AEROBIC AND ANAEROBIC BOTTLES CRITICAL RESULT CALLED TO, READ BACK BY AND VERIFIED WITH: KAREN HAYES 01/20/17 0945 SGD    Culture   Final     GRAM POSITIVE COCCI CULTURE REINCUBATED FOR BETTER GROWTH Performed at [facility], [address] [place], [place] [address]    Report Status PENDING  Incomplete  Culture, blood (Routine x 2)     Status: None (Preliminary result)   Collection Time: 01/19/17 12:46 PM  Result Value Ref Range Status   Specimen Description BLOOD R ARM  Final   Special Requests   Final    BOTTLES DRAWN AEROBIC AND ANAEROBIC Blood Culture adequate volume   Culture  Setup Time   Final    GRAM POSITIVE COCCI AEROBIC BOTTLE ONLY CRITICAL VALUE NOTED.  VALUE IS CONSISTENT WITH PREVIOUSLY REPORTED AND CALLED VALUE.    Culture   Final    GRAM POSITIVE COCCI CULTURE REINCUBATED FOR BETTER GROWTH Performed at [facility], [address] [place], [place] [address]    Report Status PENDING  Incomplete  Blood Culture ID Panel (Reflexed)     Status: Abnormal   Collection Time: 01/19/17 12:46 PM  Result Value Ref Range Status   Enterococcus species NOT DETECTED NOT DETECTED Final   Listeria monocytogenes NOT DETECTED NOT DETECTED Final   Staphylococcus species DETECTED (A) NOT DETECTED Final    Comment: Methicillin (oxacillin) susceptible coagulase negative staphylococcus. Possible blood culture contaminant (unless isolated from more than one blood culture draw or clinical case suggests pathogenicity). No antibiotic treatment is indicated for blood  culture contaminants. CRITICAL RESULT CALLED TO, READ BACK BY AND VERIFIED WITH: KAREN HAYES 01/20/17 0945 SGD    Staphylococcus aureus NOT DETECTED NOT DETECTED Final   Methicillin resistance NOT DETECTED NOT DETECTED Final   Streptococcus species NOT DETECTED NOT DETECTED Final   Streptococcus agalactiae NOT DETECTED NOT DETECTED Final   Streptococcus pneumoniae NOT DETECTED NOT DETECTED Final   Streptococcus pyogenes NOT DETECTED NOT DETECTED Final   Acinetobacter baumannii NOT DETECTED NOT DETECTED Final   Enterobacteriaceae species NOT DETECTED  NOT DETECTED Final   Enterobacter cloacae complex NOT DETECTED NOT DETECTED Final   Escherichia coli NOT DETECTED NOT DETECTED Final   Klebsiella oxytoca NOT DETECTED NOT DETECTED Final   Klebsiella pneumoniae NOT DETECTED NOT DETECTED Final   Proteus species NOT DETECTED NOT DETECTED Final   Serratia marcescens NOT DETECTED NOT DETECTED Final   Haemophilus influenzae NOT DETECTED NOT DETECTED Final   Neisseria meningitidis NOT DETECTED NOT DETECTED Final  Pseudomonas aeruginosa NOT DETECTED NOT DETECTED Final   Candida albicans NOT DETECTED NOT DETECTED Final   Candida glabrata NOT DETECTED NOT DETECTED Final   Candida krusei NOT DETECTED NOT DETECTED Final   Candida parapsilosis NOT DETECTED NOT DETECTED Final   Candida tropicalis NOT DETECTED NOT DETECTED Final  Urine culture     Status: None   Collection Time: 01/19/17  1:36 PM  Result Value Ref Range Status   Specimen Description URINE, RANDOM  Final   Special Requests NONE  Final   Culture   Final    NO GROWTH Performed at [facility], [address] [place], [place] [address]    Report Status 01/20/2017 FINAL  Final  MRSA PCR Screening     Status: None   Collection Time: 01/19/17  5:29 PM  Result Value Ref Range Status   MRSA by PCR NEGATIVE NEGATIVE Final    Comment:        The GeneXpert MRSA Assay (FDA approved for NASAL specimens only), is one component of a comprehensive MRSA colonization surveillance program. It is not intended to diagnose MRSA infection nor to guide or monitor treatment for MRSA infections.      Studies: Ct Chest Wo Contrast  Result Date: 01/19/2017 CLINICAL DATA:  The diaphoretic and tachycardic. Abdominal pain and diarrhea. EXAM: CT CHEST, ABDOMEN AND PELVIS WITHOUT CONTRAST TECHNIQUE: Multidetector CT imaging of the chest, abdomen and pelvis was performed following the standard protocol without IV contrast. COMPARISON:  Abdomen and pelvis CT 11/23/2012 FINDINGS: CT CHEST FINDINGS  Cardiovascular: The heart size is normal. No pericardial effusion. Coronary artery calcification is noted. Atherosclerotic calcification is noted in the wall of the thoracic aorta. Mediastinum/Nodes: No mediastinal lymphadenopathy. No evidence for gross hilar lymphadenopathy although assessment is limited by the lack of intravenous contrast on today's study. The esophagus has normal imaging features. There is no axillary lymphadenopathy. Lungs/Pleura: Assessment lung parenchyma is degraded by breathing motion. There is bibasilar atelectasis. Bronchial wall thickening is most evident in the lower lobes and areas of small airway impaction are seen in the lower lobes bilaterally. Probable scarring in the right middle lobe and lingula slightly progressive since prior abdomen and pelvis CT. No pulmonary edema or pleural effusion. Musculoskeletal: Old left seventh rib fracture noted. CT ABDOMEN PELVIS FINDINGS Hepatobiliary: The liver shows diffusely decreased attenuation suggesting steatosis. Gallbladder is markedly distended. No intrahepatic or extrahepatic biliary dilation. Pancreas: Pancreas diffusely fatty replaced without focal mass lesion or dilatation of the main duct. Spleen: No splenomegaly. No focal mass lesion. Adrenals/Urinary Tract: No adrenal nodule or mass. Kidneys are unremarkable bilaterally. No evidence for hydroureter. The urinary bladder appears normal for the degree of distention. Stomach/Bowel: Stomach is nondistended. No gastric wall thickening. No evidence of outlet obstruction. Duodenum is normally positioned as is the ligament of Treitz. No small bowel wall thickening. No small bowel dilatation. Patient is status post right hemicolectomy. Diverticular changes are noted in the left colon without evidence of diverticulitis. Vascular/Lymphatic: There is abdominal aortic atherosclerosis without aneurysm. There is no gastrohepatic or hepatoduodenal ligament lymphadenopathy. No intraperitoneal or  retroperitoneal lymphadenopathy. No pelvic sidewall lymphadenopathy. Reproductive: The prostate gland and seminal vesicles have normal imaging features. Other: No intraperitoneal free fluid. Musculoskeletal: Bone windows reveal no worrisome lytic or sclerotic osseous lesions. Stable appearance L1 superior endplate compression deformity. IMPRESSION: 1. Bilateral posterior lower lobe collapse/consolidation with bronchial wall thickening and small airway impaction. These changes may be related to atypical infection or bronchopneumonia, but in the  appropriate clinical setting, aspiration could have the same imaging findings. 2. Hepatic steatosis. 3. Marked gallbladder distention without biliary dilatation. 4. Status post right hemicolectomy without evidence for bowel obstruction or bowel wall thickening. No intraperitoneal free fluid. 5. Stable appearance L1 superior endplate compression deformity. Electronically Signed   By: Kennith Center M.D.   On: 01/19/2017 14:51   US Renal  Result Date: 01/20/2017 CLINICAL DATA:  Acute kidney injury EXAM: RENAL / URINARY TRACT ULTRASOUND COMPLETE COMPARISON:  CT 01/19/2017 FINDINGS: Right Kidney: Length: 11.5 cm. Echogenicity within normal limits. No mass or hydronephrosis visualized. Left Kidney: Length: 0.4 cm. Echogenicity within normal limits. No mass or hydronephrosis visualized. Bladder: Appears normal for degree of bladder distention. IMPRESSION: Normal renal ultrasound Electronically Signed   By: Charlett Nose M.D.   On: 01/20/2017 10:42   Ct Renal Stone Study  Result Date: 01/19/2017 CLINICAL DATA:  The diaphoretic and tachycardic. Abdominal pain and diarrhea. EXAM: CT CHEST, ABDOMEN AND PELVIS WITHOUT CONTRAST TECHNIQUE: Multidetector CT imaging of the chest, abdomen and pelvis was performed following the standard protocol without IV contrast. COMPARISON:  Abdomen and pelvis CT 11/23/2012 FINDINGS: CT CHEST FINDINGS Cardiovascular: The heart size is normal. No  pericardial effusion. Coronary artery calcification is noted. Atherosclerotic calcification is noted in the wall of the thoracic aorta. Mediastinum/Nodes: No mediastinal lymphadenopathy. No evidence for gross hilar lymphadenopathy although assessment is limited by the lack of intravenous contrast on today's study. The esophagus has normal imaging features. There is no axillary lymphadenopathy. Lungs/Pleura: Assessment lung parenchyma is degraded by breathing motion. There is bibasilar atelectasis. Bronchial wall thickening is most evident in the lower lobes and areas of small airway impaction are seen in the lower lobes bilaterally. Probable scarring in the right middle lobe and lingula slightly progressive since prior abdomen and pelvis CT. No pulmonary edema or pleural effusion. Musculoskeletal: Old left seventh rib fracture noted. CT ABDOMEN PELVIS FINDINGS Hepatobiliary: The liver shows diffusely decreased attenuation suggesting steatosis. Gallbladder is markedly distended. No intrahepatic or extrahepatic biliary dilation. Pancreas: Pancreas diffusely fatty replaced without focal mass lesion or dilatation of the main duct. Spleen: No splenomegaly. No focal mass lesion. Adrenals/Urinary Tract: No adrenal nodule or mass. Kidneys are unremarkable bilaterally. No evidence for hydroureter. The urinary bladder appears normal for the degree of distention. Stomach/Bowel: Stomach is nondistended. No gastric wall thickening. No evidence of outlet obstruction. Duodenum is normally positioned as is the ligament of Treitz. No small bowel wall thickening. No small bowel dilatation. Patient is status post right hemicolectomy. Diverticular changes are noted in the left colon without evidence of diverticulitis. Vascular/Lymphatic: There is abdominal aortic atherosclerosis without aneurysm. There is no gastrohepatic or hepatoduodenal ligament lymphadenopathy. No intraperitoneal or retroperitoneal lymphadenopathy. No pelvic  sidewall lymphadenopathy. Reproductive: The prostate gland and seminal vesicles have normal imaging features. Other: No intraperitoneal free fluid. Musculoskeletal: Bone windows reveal no worrisome lytic or sclerotic osseous lesions. Stable appearance L1 superior endplate compression deformity. IMPRESSION: 1. Bilateral posterior lower lobe collapse/consolidation with bronchial wall thickening and small airway impaction. These changes may be related to atypical infection or bronchopneumonia, but in the appropriate clinical setting, aspiration could have the same imaging findings. 2. Hepatic steatosis. 3. Marked gallbladder distention without biliary dilatation. 4. Status post right hemicolectomy without evidence for bowel obstruction or bowel wall thickening. No intraperitoneal free fluid. 5. Stable appearance L1 superior endplate compression deformity. Electronically Signed   By: Kennith Center M.D.   On: 01/19/2017 14:51   US Abdomen Limited Ruq  Result Date: 01/21/2017 CLINICAL DATA:  Abdominal pain EXAM: ULTRASOUND ABDOMEN LIMITED RIGHT UPPER QUADRANT COMPARISON:  CT abdomen and pelvis January 19, 2017 FINDINGS: Gallbladder: Within the gallbladder, there are multiple small echogenic foci which move and shadow consistent with cholelithiasis. Largest individual gallstone measures 8 mm in length. Gallbladder wall does not appear appreciably thickened, and there is no appreciable pericholecystic fluid. No sonographic Murphy sign noted by sonographer. Common bile duct: Diameter: 6 mm. There is no intrahepatic or extrahepatic biliary duct dilatation. Liver: No focal lesion identified. Liver echogenicity is increased diffusely. IMPRESSION: Cholelithiasis. Diffuse increased liver echogenicity, a finding most likely due to hepatic steatosis. While no focal liver lesions are apparent on this study, it must be cautioned that the sensitivity of ultrasound for detection of focal liver lesions is diminished in this circumstance.  Electronically Signed   By: Bretta Bang III M.D.   On: 01/21/2017 08:17    Scheduled Meds: . allopurinol  100 mg Oral Daily  . enoxaparin (LOVENOX) injection  40 mg Subcutaneous Q24H  . mouth rinse  15 mL Mouth Rinse BID  . metroNIDAZOLE  500 mg Oral Q8H  . pantoprazole  40 mg Oral Daily  . potassium chloride  20 mEq Oral BID  . sertraline  50 mg Oral q morning - 10a   Continuous Infusions: . ceFEPIme (MAXIPIME) 1 GM IVPB    . potassium phosphate IVPB (mmol) 20 mmol (01/21/17 1348)    Assessment/Plan:  1. Clinical sepsis secondary to suspected pneumonia. Patient is on cefepime and Flagyl as per ID. Vancomycin discontinued since MRSA PCR was negative. 2. Hypokalemia. Replace with K-Phos today and decrease supplementation orally. 3. Hypomagnesemia replace magnesium IV today 4. Hypophosphatemia replace with K-Phos today 5. Acute encephalopathy secondary to sepsis. 6. Acute kidney injury. Creatinine has normalized with IV fluid hydration. IV fluids stopped. 7. History of traumatic brain injury 8. History of hypertension. Blood pressure stable off medications 9. History of GERD on PPI 10. History of gout on allopurinol 11. Left toenail on first big toe fell off  Code Status:     Code Status Orders        Start     Ordered   01/19/17 1803  Full code  Continuous     01/19/17 1802    Code Status History    Date Active Date Inactive Code Status Order ID Comments User Context   This patient has a current code status but no historical code status.    Advance Directive Documentation     Most Recent Value  Type of Advance Directive  Healthcare Power of Attorney  Pre-existing out of facility DNR order (yellow form or pink MOST form)  -  "MOST" Form in Place?  -     Disposition Plan: Physical therapy recommended rehabilitation. We'll get Child psychotherapist involved  Consultants:  Infectious disease  Antibiotics:  Cefepime  Flagyl  Time spent: 28 minutes  Alford Highland  [employer]

## 2017-01-21 NOTE — Clinical Social Work Note (Signed)
CSW received consult for SNF placement recommendation from PT. CSW will assess when able.  Argentina PonderKaren Martha Karlin Binion, MSW, Theresia MajorsLCSWA [phone]

## 2017-01-21 NOTE — Clinical Social Work Note (Signed)
CSW received consult that patient is from independent living. CSW is not needed for discharge to independent living. CSW will watch for any changes to care level needs pending PT recommendations.  Argentina PonderKaren Martha Orla Jolliff, MSW, Theresia MajorsLCSWA [phone]

## 2017-01-21 NOTE — Progress Notes (Signed)
Patient's Left Big toenail fell off, found off on floor, no bleeding noted. Patient's toe color appropriate and warm- will continue to monitor

## 2017-01-21 NOTE — Progress Notes (Signed)
[facility]

## 2017-01-21 NOTE — Plan of Care (Signed)
Problem: Safety: Goal: Ability to remain free from injury will improve Outcome: Progressing Patient still impulsive and having episodes of incontinence. Patient being offered more frequent toileting- attempting to escort patient to restroom approximately every 4 hours. Telesitter still in place to aid in keeping patient free from falls or injury. No new injuries noted this shift.   Problem: Pain Managment: Goal: General experience of comfort will improve Outcome: Progressing Patient denies any pain, but is asking frequently for Zofran; per POA due to patient's TBI patient gets obsessed with ideas or words and sometimes has a hard time with comprehension and time.  Problem: Physical Regulation: Goal: Ability to maintain clinical measurements within normal limits will improve Outcome: Progressing Attempted to wean patient off oxygen at 2liters, however patient's O2 sats dropped and to 90-91%, attempted to place patient on 1Liter, however sats still did not come back up until patient was placed on initial 2 Liters via nasal cannula Goal: Will remain free from infection Outcome: Progressing No new signs or symptoms of infection noted. Patient's Left Big Toenail noted to have fallen off, MD aware.  Problem: Skin Integrity: Goal: Risk for impaired skin integrity will decrease Outcome: Progressing No new skin breakdown noted. Patient able to turn and reposition self in bed. Patient able to work with Physical Therapy  Problem: Fluid Volume: Goal: Ability to maintain a balanced intake and output will improve Outcome: Progressing Ultrasound completed an patient changed back to heart healthy diet.   Comments: [place]'s [place]-  POA (sister, P. Darcy)able to speak with Doctor per phone regarding continued plan of care. Mag and Phos replaced today. K improved to 3.4  Plan for patient to stay until Monday, continue to attempt to wean if able.

## 2017-01-22 LAB — CULTURE, BLOOD (ROUTINE X 2): SPECIAL REQUESTS: ADEQUATE

## 2017-01-22 LAB — COMPREHENSIVE METABOLIC PANEL
ALBUMIN: 2.9 g/dL — AB (ref 3.5–5.0)
ALK PHOS: 64 U/L (ref 38–126)
ALT: 84 U/L — ABNORMAL HIGH (ref 17–63)
ANION GAP: 6 (ref 5–15)
AST: 306 U/L — ABNORMAL HIGH (ref 15–41)
BUN: 12 mg/dL (ref 6–20)
CHLORIDE: 105 mmol/L (ref 101–111)
CO2: 27 mmol/L (ref 22–32)
Calcium: 8 mg/dL — ABNORMAL LOW (ref 8.9–10.3)
Creatinine, Ser: 0.97 mg/dL (ref 0.61–1.24)
GFR calc non Af Amer: >60 mL/min (ref >60)
GLUCOSE: 97 mg/dL (ref 65–99)
POTASSIUM: 3.6 mmol/L (ref 3.5–5.1)
SODIUM: 138 mmol/L (ref 135–145)
Total Bilirubin: 0.7 mg/dL (ref 0.3–1.2)
Total Protein: 5.3 g/dL — ABNORMAL LOW (ref 6.5–8.1)

## 2017-01-22 LAB — PHOSPHORUS: Phosphorus: 2.3 mg/dL — ABNORMAL LOW (ref 2.5–4.6)

## 2017-01-22 LAB — MAGNESIUM: Magnesium: 1.6 mg/dL — ABNORMAL LOW (ref 1.7–2.4)

## 2017-01-22 MED ORDER — IPRATROPIUM-ALBUTEROL 0.5-2.5 (3) MG/3ML IN SOLN
3.0000 mL | Freq: Four times a day (QID) | RESPIRATORY_TRACT | Status: DC
  Administered 2017-01-22 (×3): 3 mL via RESPIRATORY_TRACT
  Filled 2017-01-22 (×3): qty 3

## 2017-01-22 MED ORDER — POTASSIUM & SODIUM PHOSPHATES 280-160-250 MG PO PACK
2.0000 | PACK | Freq: Three times a day (TID) | ORAL | Status: AC
  Administered 2017-01-22 (×2): 2 via ORAL
  Filled 2017-01-22 (×2): qty 2

## 2017-01-22 MED ORDER — BUDESONIDE 0.5 MG/2ML IN SUSP
0.5000 mg | Freq: Two times a day (BID) | RESPIRATORY_TRACT | Status: DC
  Administered 2017-01-22 – 2017-01-24 (×5): 0.5 mg via RESPIRATORY_TRACT
  Filled 2017-01-22 (×5): qty 2

## 2017-01-22 MED ORDER — POTASSIUM PHOSPHATE MONOBASIC 500 MG PO TABS
500.0000 mg | ORAL_TABLET | Freq: Three times a day (TID) | ORAL | Status: DC
  Filled 2017-01-22: qty 1

## 2017-01-22 MED ORDER — MAGNESIUM OXIDE 400 (241.3 MG) MG PO TABS
400.0000 mg | ORAL_TABLET | Freq: Two times a day (BID) | ORAL | Status: DC
  Administered 2017-01-22 – 2017-01-24 (×4): 400 mg via ORAL
  Filled 2017-01-22 (×4): qty 1

## 2017-01-22 MED ORDER — MAGNESIUM SULFATE 2 GM/50ML IV SOLN
2.0000 g | Freq: Once | INTRAVENOUS | Status: AC
  Administered 2017-01-22: 10:00:00 2 g via INTRAVENOUS
  Filled 2017-01-22: qty 50

## 2017-01-22 MED ORDER — MAGNESIUM SULFATE 2 GM/50ML IV SOLN
2.0000 g | Freq: Once | INTRAVENOUS | Status: AC
  Administered 2017-01-22: 2 g via INTRAVENOUS
  Filled 2017-01-22: qty 50

## 2017-01-22 MED ORDER — DEXTROSE 5 % IV SOLN
1.0000 g | Freq: Three times a day (TID) | INTRAVENOUS | Status: DC
  Administered 2017-01-22 – 2017-01-24 (×6): 1 g via INTRAVENOUS
  Filled 2017-01-22 (×8): qty 1

## 2017-01-22 MED ORDER — PREDNISONE 20 MG PO TABS
40.0000 mg | ORAL_TABLET | Freq: Every day | ORAL | Status: DC
  Administered 2017-01-22: 40 mg via ORAL
  Filled 2017-01-22: qty 2

## 2017-01-22 NOTE — Plan of Care (Signed)
Problem: Physical Regulation: Goal: Ability to maintain clinical measurements within normal limits will improve Outcome: Progressing Pt is very calm today. Slept between care. Telesitter d/c at 1610. Low bed pending.  Pt c/o nausea once. No emesis. Zofran given with improvement.  O2 sats in the high 90's on RA.

## 2017-01-22 NOTE — Progress Notes (Signed)
Patient ID: Jacob Balex Tat, male   DOB: 06/20/1951, 66 y.o.   MRN: [id number]   [facility] PROGRESS NOTE  Jacob Villanueva NGE:[id number] DOB: 02/24/1951 DOA: 01/19/2017 PCP: Patient, No Pcp Per  HPI/Subjective: Patient got upset when I mention that I spoke with his sister yesterday. Patient feels okay. Some cough. States he vomited yesterday but the nurse said he did not.  Objective: Vitals:   01/22/17 0435 01/22/17 1213  BP: 119/72   Pulse: 76 74  Resp: 20 18  Temp: 98 F (36.7 C)     Filed Weights   01/19/17 1258 01/19/17 1800  Weight: 90.3 kg (199 lb) 88.8 kg (195 lb 12.3 oz)    ROS: Review of Systems  Constitutional: Negative for chills and fever.  Eyes: Negative for blurred vision.  Respiratory: Positive for cough. Negative for shortness of breath.   Cardiovascular: Negative for chest pain.  Gastrointestinal: Positive for diarrhea. Negative for abdominal pain, constipation, nausea and vomiting.  Genitourinary: Negative for dysuria.  Musculoskeletal: Negative for joint pain.  Neurological: Negative for dizziness and headaches.   Exam: Physical Exam  HENT:  Nose: No mucosal edema.  Mouth/Throat: No oropharyngeal exudate or posterior oropharyngeal edema.  Eyes: Conjunctivae, EOM and lids are normal. Pupils are equal, round, and reactive to light.  Neck: No JVD present. Carotid bruit is not present. No edema present. No thyroid mass and no thyromegaly present.  Cardiovascular: S1 normal, S2 normal and normal heart sounds.  Exam reveals no gallop.   No murmur heard. Pulses:      Dorsalis pedis pulses are 2+ on the right side, and 2+ on the left side.  Respiratory: No respiratory distress. He has no wheezes. He has no rhonchi. He has no rales.  GI: Soft. Bowel sounds are normal. There is no tenderness.  Musculoskeletal:       Right ankle: He exhibits no swelling.       Left ankle: He exhibits no swelling.  Lymphadenopathy:    He has no cervical adenopathy.  Neurological: He is  alert. No cranial nerve deficit.  Skin: Skin is warm. No rash noted. Nails show no clubbing.  Psychiatric: He has a normal mood and affect.      Data Reviewed: Basic Metabolic Panel:  Recent Labs Lab 01/19/17 1239 01/19/17 2148 01/20/17 0508 01/20/17 0619 01/21/17 1010 01/22/17 0425  NA 140  --  132*  --  135 138  K 2.6* 2.9* 2.8*  --  3.4* 3.6  CL 98*  --  102  --  102 105  CO2 23  --  23  --  25 27  GLUCOSE 179*  --  107*  --  106* 97  BUN 28*  --  25*  --  14 12  CREATININE 3.63*  --  1.82*  --  0.89 0.97  CALCIUM 9.5  --  7.2*  --  8.1* 8.0*  MG 2.4  --   --  1.8 1.8 1.6*  PHOS  --   --   --   --  1.7* 2.3*   Liver Function Tests:  Recent Labs Lab 01/19/17 1239 01/20/17 0508 01/22/17 0425  AST 126* 375* 306*  ALT 25 58 84*  ALKPHOS 88 57 64  BILITOT 1.8* 1.0 0.7  PROT 8.2* 5.6* 5.3*  ALBUMIN 4.6 3.2* 2.9*    Recent Labs Lab 01/19/17 1239  LIPASE 53*    Recent Labs Lab 01/19/17 1806  AMMONIA 18   CBC:  Recent Labs Lab 01/19/17  1239 01/20/17 0508  WBC 24.0* 15.7*  NEUTROABS 21.3*  --   HGB 16.8 12.6*  HCT 49.1 36.4*  MCV 92.6 92.8  PLT 264 176   Cardiac Enzymes:  Recent Labs Lab 01/19/17 1239  TROPONINI 0.05*   BNP (last 3 results)  Recent Labs  01/19/17 1239  BNP 28.0    ProBNP (last 3 results) No results for input(s): PROBNP in the last 8760 hours.  CBG:  Recent Labs Lab 01/19/17 1758  GLUCAP 156*    Recent Results (from the past 240 hour(s))  Culture, blood (Routine x 2)     Status: Abnormal   Collection Time: 01/19/17 12:46 PM  Result Value Ref Range Status   Specimen Description BLOOD R HAND  Final   Special Requests   Final    BOTTLES DRAWN AEROBIC AND ANAEROBIC Blood Culture results may not be optimal due to an excessive volume of blood received in culture bottles   Culture  Setup Time   Final    GRAM POSITIVE COCCI IN BOTH AEROBIC AND ANAEROBIC BOTTLES CRITICAL RESULT CALLED TO, READ BACK BY AND VERIFIED  WITH: KAREN HAYES 01/20/17 0945 SGD    Culture (A)  Final    STAPHYLOCOCCUS HOMINIS SUSCEPTIBILITIES PERFORMED ON PREVIOUS CULTURE WITHIN THE LAST 5 DAYS. Performed at [facility], [address], [place], [place] [address]    Report Status 01/22/2017 FINAL  Final  Culture, blood (Routine x 2)     Status: Abnormal   Collection Time: 01/19/17 12:46 PM  Result Value Ref Range Status   Specimen Description BLOOD R ARM  Final   Special Requests   Final    BOTTLES DRAWN AEROBIC AND ANAEROBIC Blood Culture adequate volume   Culture  Setup Time   Final    GRAM POSITIVE COCCI AEROBIC BOTTLE ONLY CRITICAL VALUE NOTED.  VALUE IS CONSISTENT WITH PREVIOUSLY REPORTED AND CALLED VALUE.    Culture STAPHYLOCOCCUS HOMINIS (A)  Final   Report Status 01/22/2017 FINAL  Final   Organism ID, Bacteria STAPHYLOCOCCUS HOMINIS  Final      Susceptibility   Staphylococcus hominis - MIC*    CIPROFLOXACIN <=0.5 SENSITIVE Sensitive     ERYTHROMYCIN <=0.25 SENSITIVE Sensitive     GENTAMICIN <=0.5 SENSITIVE Sensitive     OXACILLIN <=0.25 SENSITIVE Sensitive     TETRACYCLINE <=1 SENSITIVE Sensitive     VANCOMYCIN <=0.5 SENSITIVE Sensitive     TRIMETH/SULFA <=10 SENSITIVE Sensitive     CLINDAMYCIN <=0.25 SENSITIVE Sensitive     RIFAMPIN <=0.5 SENSITIVE Sensitive     Inducible Clindamycin NEGATIVE Sensitive     * STAPHYLOCOCCUS HOMINIS  Blood Culture ID Panel (Reflexed)     Status: Abnormal   Collection Time: 01/19/17 12:46 PM  Result Value Ref Range Status   Enterococcus species NOT DETECTED NOT DETECTED Final   Listeria monocytogenes NOT DETECTED NOT DETECTED Final   Staphylococcus species DETECTED (A) NOT DETECTED Final    Comment: Methicillin (oxacillin) susceptible coagulase negative staphylococcus. Possible blood culture contaminant (unless isolated from more than one blood culture draw or clinical case suggests pathogenicity). No antibiotic treatment is indicated for blood  culture  contaminants. CRITICAL RESULT CALLED TO, READ BACK BY AND VERIFIED WITH: KAREN HAYES 01/20/17 0945 SGD    Staphylococcus aureus NOT DETECTED NOT DETECTED Final   Methicillin resistance NOT DETECTED NOT DETECTED Final   Streptococcus species NOT DETECTED NOT DETECTED Final   Streptococcus agalactiae NOT DETECTED NOT DETECTED Final   Streptococcus pneumoniae NOT DETECTED  NOT DETECTED Final   Streptococcus pyogenes NOT DETECTED NOT DETECTED Final   Acinetobacter baumannii NOT DETECTED NOT DETECTED Final   Enterobacteriaceae species NOT DETECTED NOT DETECTED Final   Enterobacter cloacae complex NOT DETECTED NOT DETECTED Final   Escherichia coli NOT DETECTED NOT DETECTED Final   Klebsiella oxytoca NOT DETECTED NOT DETECTED Final   Klebsiella pneumoniae NOT DETECTED NOT DETECTED Final   Proteus species NOT DETECTED NOT DETECTED Final   Serratia marcescens NOT DETECTED NOT DETECTED Final   Haemophilus influenzae NOT DETECTED NOT DETECTED Final   Neisseria meningitidis NOT DETECTED NOT DETECTED Final   Pseudomonas aeruginosa NOT DETECTED NOT DETECTED Final   Candida albicans NOT DETECTED NOT DETECTED Final   Candida glabrata NOT DETECTED NOT DETECTED Final   Candida krusei NOT DETECTED NOT DETECTED Final   Candida parapsilosis NOT DETECTED NOT DETECTED Final   Candida tropicalis NOT DETECTED NOT DETECTED Final  Urine culture     Status: None   Collection Time: 01/19/17  1:36 PM  Result Value Ref Range Status   Specimen Description URINE, RANDOM  Final   Special Requests NONE  Final   Culture   Final    NO GROWTH Performed at [facility], [address] [place], [place] [address]    Report Status 01/20/2017 FINAL  Final  MRSA PCR Screening     Status: None   Collection Time: 01/19/17  5:29 PM  Result Value Ref Range Status   MRSA by PCR NEGATIVE NEGATIVE Final    Comment:        The GeneXpert MRSA Assay (FDA approved for NASAL specimens only), is one component of  a comprehensive MRSA colonization surveillance program. It is not intended to diagnose MRSA infection nor to guide or monitor treatment for MRSA infections.      Studies: US Abdomen Limited Ruq  Result Date: 01/21/2017 CLINICAL DATA:  Abdominal pain EXAM: ULTRASOUND ABDOMEN LIMITED RIGHT UPPER QUADRANT COMPARISON:  CT abdomen and pelvis January 19, 2017 FINDINGS: Gallbladder: Within the gallbladder, there are multiple small echogenic foci which move and shadow consistent with cholelithiasis. Largest individual gallstone measures 8 mm in length. Gallbladder wall does not appear appreciably thickened, and there is no appreciable pericholecystic fluid. No sonographic Murphy sign noted by sonographer. Common bile duct: Diameter: 6 mm. There is no intrahepatic or extrahepatic biliary duct dilatation. Liver: No focal lesion identified. Liver echogenicity is increased diffusely. IMPRESSION: Cholelithiasis. Diffuse increased liver echogenicity, a finding most likely due to hepatic steatosis. While no focal liver lesions are apparent on this study, it must be cautioned that the sensitivity of ultrasound for detection of focal liver lesions is diminished in this circumstance. Electronically Signed   By: Bretta Bang III M.D.   On: 01/21/2017 08:17    Scheduled Meds: . allopurinol  100 mg Oral Daily  . budesonide (PULMICORT) nebulizer solution  0.5 mg Nebulization BID  . enoxaparin (LOVENOX) injection  40 mg Subcutaneous Q24H  . ipratropium-albuterol  3 mL Nebulization Q6H  . magnesium oxide  400 mg Oral BID  . mouth rinse  15 mL Mouth Rinse BID  . metroNIDAZOLE  500 mg Oral Q8H  . pantoprazole  40 mg Oral Daily  . potassium & sodium phosphates  2 packet Oral TID WC & HS  . potassium chloride  20 mEq Oral BID  . predniSONE  40 mg Oral Q breakfast  . sertraline  50 mg Oral q morning - 10a   Continuous Infusions: . ceFEPIme (  MAXIPIME) 1 GM IVPB Stopped (01/22/17 1230)  . magnesium sulfate 1 - 4 g  bolus IVPB      Assessment/Plan:  1. Clinical sepsis secondary to suspected pneumonia. Patient is on cefepime and Flagyl as per ID. Vancomycin discontinued since MRSA PCR was negative. Staphylococcus hominis growing in the blood culture unsure if this is a contamination or not. Hearing more rhonchi in the lungs. Start prednisone and nebulizer treatments with budesonide and DuoNeb. 2. Hypokalemia. Replace orally 3. Hypomagnesemia replace magnesium IV today 4. Hypophosphatemia replace orally 5. Acute encephalopathy secondary to sepsis. 6. Acute kidney injury. Creatinine has normalized with IV fluid hydration. IV fluids stopped. 7. History of traumatic brain injury 8. History of hypertension. Blood pressure stable off medications 9. History of GERD on PPI 10. History of gout on allopurinol 11. Left toenail on first big toe fell off 12. Elevated liver function tests.  AST and ALT still elevated. Imaging studies unremarkable. Send a hepatitis profile off tomorrow. Continue to monitor liver function tests tomorrow. 13. Weakness. Sister interested in rehabilitation.  Code Status:     Code Status Orders        Start     Ordered   01/19/17 1803  Full code  Continuous     01/19/17 1802    Code Status History    Date Active Date Inactive Code Status Order ID Comments User Context   This patient has a current code status but no historical code status.    Advance Directive Documentation     Most Recent Value  Type of Advance Directive  Healthcare Power of Attorney  Pre-existing out of facility DNR order (yellow form or pink MOST form)  -  "MOST" Form in Place?  -     Disposition Plan: Physical therapy recommended rehabilitation. We'll get Child psychotherapist involved  Consultants:  Infectious disease  Antibiotics:  Cefepime  Flagyl  Time spent: 28 minutes. Case discussed with sister who is the POA on the phone  Jacob Villanueva  [employer]

## 2017-01-22 NOTE — Clinical Social Work Note (Signed)
Clinical Social Work Assessment  Patient Details  Name: Jacob Villanueva MRN: [id number] Date of Birth: 03/24/1951  Date of referral:  01/22/17               Reason for consult:  Facility Placement                Permission sought to share information with:  Facility Industrial/product designerContact Representative Permission granted to share information::  Yes, Verbal Permission Granted  Name::        Agency::     Relationship::     Contact Information:     Housing/Transportation Living arrangements for the past 2 months:  Independent Living Facility Source of Information:  Other (Comment Required) (Sister/HCPOA) Patient Interpreter Needed:  None Criminal Activity/Legal Involvement Pertinent to Current Situation/Hospitalization:  No - Comment as needed Significant Relationships:  Siblings Lives with:  Self Do you feel safe going back to the place where you live?  Yes Need for family participation in patient care:  Yes (Comment)  Care giving concerns:  PT recommendation for SNF   Social Worker assessment / plan:  CSW contacted the patient's HCPOA/sister, Pam to discuss discharge planning. Pam gave verbal permission to conduct a bed search, and she asked appropriate questions about the referral process. Pam reported that she and her niece prefer [facility] if possible due to the nearness to family.  [facility] The patient has a history of TBI, but he is able to live independently. The CSW has sent the referral and received the patient's PASRR information. DC possible on 6/11 or 6/12.   Employment status:  Retired Database administratornsurance information:  Managed Medicare PT Recommendations:  Skilled Nursing Facility Information / Referral to community resources:  Skilled Nursing Facility  Patient/Family's Response to care:  The family is involved with care and thanked the CSW.  Patient/Family's Understanding of and Emotional Response to Diagnosis, Current Treatment, and Prognosis:  The  patient's family is new to navigating SNF placement and have appropriate questions.  Emotional Assessment Appearance:  Appears stated age Attitude/Demeanor/Rapport:  Lethargic Affect (typically observed):  Accepting Orientation:  Oriented to Self, Oriented to Place Alcohol / Substance use:  Never Used Psych involvement (Current and /or in the community):  No (Comment)  Discharge Needs  Concerns to be addressed:  Discharge Planning Concerns, Care Coordination Readmission within the last 30 days:  No Current discharge risk:  Cognitively Impaired, Lives alone Barriers to Discharge:  Continued Medical Work up   [employer] CorporationKaren M Glendy Barsanti, LCSW 01/22/2017, 2:41 PM

## 2017-01-22 NOTE — NC FL2 (Signed)
[place] MEDICAID FL2 LEVEL OF CARE SCREENING TOOL     IDENTIFICATION  Patient Name: Jacob Villanueva Birthdate: 02/14/1951 Sex: male Admission Date (Current Location): 01/19/2017  [place]   Facility and Address:  [facility], [address], [place], [place] [address]      Provider Number: [id number]  Attending Physician Name and Address:  Alford HighlandWieting, Richard, MD  Relative Name and Phone Number:       Current Level of Care: Hospital Recommended Level of Care: Skilled Nursing Facility Prior Approval Number:    Date Approved/Denied:   PASRR Number: [id number]  Discharge Plan: SNF    Current Diagnoses: Patient Active Problem List   Diagnosis Date Noted  . Acute encephalopathy 01/19/2017    Orientation RESPIRATION BLADDER Height & Weight     Self, Time, Situation  Normal Continent Weight: 195 lb 12.3 oz (88.8 kg) Height:  6\' 4"  (193 cm)  BEHAVIORAL SYMPTOMS/MOOD NEUROLOGICAL BOWEL NUTRITION STATUS    Convulsions/Seizures Continent  (Heart Healthy)  AMBULATORY STATUS COMMUNICATION OF NEEDS Skin   Extensive Assist Verbally Normal                       Personal Care Assistance Level of Assistance  Bathing, Feeding, Dressing Bathing Assistance: Limited assistance Feeding assistance: Independent Dressing Assistance: Limited assistance     Functional Limitations Info             SPECIAL CARE FACTORS FREQUENCY  PT (By licensed PT)     PT Frequency: Up to 5X per day, 5 days per week.              Contractures Contractures Info: Present    Additional Factors Info  Allergies   Allergies Info: Clindamycin/lincomycin, Effexor Venlafaxine Hcl, Amoxicillin           Current Medications (01/22/2017):  This is the current hospital active medication list Current Facility-Administered Medications  Medication Dose Route Frequency Provider Last Rate Last Dose  . acetaminophen (TYLENOL) tablet 650 mg  650 mg  Oral Q6H PRN Gouru, Aruna, MD       Or  . acetaminophen (TYLENOL) suppository 650 mg  650 mg Rectal Q6H PRN Gouru, Aruna, MD      . allopurinol (ZYLOPRIM) tablet 100 mg  100 mg Oral Daily Houston SirenSainani, Vivek J, MD   100 mg at 01/22/17 1006  . budesonide (PULMICORT) nebulizer solution 0.5 mg  0.5 mg Nebulization BID Alford HighlandWieting, Richard, MD   0.5 mg at 01/22/17 0943  . ceFEPIme (MAXIPIME) 1 g in dextrose 5 % 50 mL IVPB  1 g Intravenous Q12H Alford HighlandWieting, Richard, MD   Stopped at 01/21/17 2105  . enoxaparin (LOVENOX) injection 40 mg  40 mg Subcutaneous Q24H Gardner CandleHallaji, Sheema M, RPH   40 mg at 01/21/17 2008  . ipratropium-albuterol (DUONEB) 0.5-2.5 (3) MG/3ML nebulizer solution 3 mL  3 mL Nebulization Q6H Alford HighlandWieting, Richard, MD   3 mL at 01/22/17 0943  . magnesium sulfate IVPB 2 g 50 mL  2 g Intravenous Once Alford HighlandWieting, Richard, MD 50 mL/hr at 01/22/17 1006 2 g at 01/22/17 1006  . MEDLINE mouth rinse  15 mL Mouth Rinse BID Gouru, Aruna, MD   15 mL at 01/19/17 2059  . metroNIDAZOLE (FLAGYL) tablet 500 mg  500 mg Oral Q8H Mick SellFitzgerald, David P, MD   500 mg at 01/22/17 0452  . ondansetron (ZOFRAN) injection 4 mg  4 mg Intravenous Q6H PRN Ramonita LabGouru, Aruna, MD  4 mg at 01/22/17 0452  . pantoprazole (PROTONIX) EC tablet 40 mg  40 mg Oral Daily Houston Siren, MD   40 mg at 01/22/17 1006  . potassium chloride SA (K-DUR,KLOR-CON) CR tablet 20 mEq  20 mEq Oral BID Alford Highland, MD   20 mEq at 01/22/17 1006  . predniSONE (DELTASONE) tablet 40 mg  40 mg Oral Q breakfast Alford Highland, MD   40 mg at 01/22/17 1006  . sertraline (ZOLOFT) tablet 50 mg  50 mg Oral q morning - 10a Houston Siren, MD   50 mg at 01/22/17 1006     Discharge Medications: Please see discharge summary for a list of discharge medications.  Relevant Imaging Results:  Relevant Lab Results:   Additional Information SS# [id number]  Judi Cong, LCSW

## 2017-01-22 NOTE — Clinical Social Work Note (Signed)
CSW attempted to contact the patient's HCPOA, Wilburt FinlayPam Darcy, to discuss dc planning and for assessment information. The CSW left a voicemail and is awaiting a return call.  Argentina PonderKaren Martha Zuleima Haser, MSW, Theresia MajorsLCSWA [phone]

## 2017-01-22 NOTE — Progress Notes (Signed)
MEDICATION RELATED CONSULT NOTE   Pharmacy Consult for electrolyte management  Indication: hypokalemia   Pharmacy consulted for electrolyte management for 66 yo male being treated for hypokalemia.   K: 3.6, Mg: 1.6, Phos: 2.3  Current orders for KCl 20mEq PO BID, Mg 2gm IV x 1  Plan:  Will give additional 2gm of magnesium IV this afternoon and KPhos 500mg  x 2 doses. Recheck electrolytes with AM labs    Allergies  Allergen Reactions  . Clindamycin/Lincomycin Diarrhea  . Effexor [Venlafaxine Hcl]     DELUSIONAL   . Amoxicillin Rash   BMP Latest Ref Rng & Units 01/22/2017 01/21/2017 01/20/2017  Glucose 65 - 99 mg/dL 97 161(W106(H) 960(A107(H)  BUN 6 - 20 mg/dL 12 14 54(U25(H)  Creatinine 0.61 - 1.24 mg/dL 9.810.97 1.910.89 4.78(G1.82(H)  Sodium 135 - 145 mmol/L 138 135 132(L)  Potassium 3.5 - 5.1 mmol/L 3.6 3.4(L) 2.8(L)  Chloride 101 - 111 mmol/L 105 102 102  CO2 22 - 32 mmol/L 27 25 23   Calcium 8.9 - 10.3 mg/dL 8.0(L) 8.1(L) 7.2(L)     Patient Measurements: Height: 6\' 4"  (193 cm) Weight: 195 lb 12.3 oz (88.8 kg) IBW/kg (Calculated) : 86.8   Vital Signs: Temp: 98 F (36.7 C) (06/10 0435) Temp Source: Oral (06/10 0435) BP: 119/72 (06/10 0435) Pulse Rate: 76 (06/10 0435) Intake/Output from previous day: 06/09 0701 - 06/10 0700 In: 1036.7 [P.O.:480; IV Piggyback:556.7] Out: -  Intake/Output from this shift: Total I/O In: 600 [P.O.:600] Out: -   Labs:  Recent Labs  01/19/17 1239 01/20/17 0508 01/20/17 0619 01/21/17 1010 01/22/17 0425  WBC 24.0* 15.7*  --   --   --   HGB 16.8 12.6*  --   --   --   HCT 49.1 36.4*  --   --   --   PLT 264 176  --   --   --   CREATININE 3.63* 1.82*  --  0.89 0.97  MG 2.4  --  1.8 1.8 1.6*  PHOS  --   --   --  1.7* 2.3*  ALBUMIN 4.6 3.2*  --   --  2.9*  PROT 8.2* 5.6*  --   --  5.3*  AST 126* 375*  --   --  306*  ALT 25 58  --   --  84*  ALKPHOS 88 57  --   --  64  BILITOT 1.8* 1.0  --   --  0.7   Estimated Creatinine Clearance: 89.5 mL/min (by  C-G formula based on SCr of 0.97 mg/dL).   Pharmacy will continue to monitor and adjust per consult.   Pinkey Mcjunkin C 01/22/2017,10:49 AM

## 2017-01-23 LAB — COMPREHENSIVE METABOLIC PANEL
ALT: 79 U/L — AB (ref 17–63)
AST: 175 U/L — AB (ref 15–41)
Albumin: 3.1 g/dL — ABNORMAL LOW (ref 3.5–5.0)
Alkaline Phosphatase: 74 U/L (ref 38–126)
Anion gap: 5 (ref 5–15)
BILIRUBIN TOTAL: 0.6 mg/dL (ref 0.3–1.2)
BUN: 12 mg/dL (ref 6–20)
CO2: 27 mmol/L (ref 22–32)
CREATININE: 0.87 mg/dL (ref 0.61–1.24)
Calcium: 8.4 mg/dL — ABNORMAL LOW (ref 8.9–10.3)
Chloride: 103 mmol/L (ref 101–111)
Glucose, Bld: 127 mg/dL — ABNORMAL HIGH (ref 65–99)
POTASSIUM: 4.3 mmol/L (ref 3.5–5.1)
Sodium: 135 mmol/L (ref 135–145)
TOTAL PROTEIN: 5.9 g/dL — AB (ref 6.5–8.1)

## 2017-01-23 LAB — PHOSPHORUS: Phosphorus: 2.7 mg/dL (ref 2.5–4.6)

## 2017-01-23 LAB — CBC
HCT: 37.8 % — ABNORMAL LOW (ref 40.0–52.0)
Hemoglobin: 13.3 g/dL (ref 13.0–18.0)
MCH: 33 pg (ref 26.0–34.0)
MCHC: 35.1 g/dL (ref 32.0–36.0)
MCV: 94.1 fL (ref 80.0–100.0)
Platelets: 217 10*3/uL (ref 150–440)
RBC: 4.02 MIL/uL — ABNORMAL LOW (ref 4.40–5.90)
RDW: 13.5 % (ref 11.5–14.5)
WBC: 11 10*3/uL — ABNORMAL HIGH (ref 3.8–10.6)

## 2017-01-23 LAB — MAGNESIUM: MAGNESIUM: 1.7 mg/dL (ref 1.7–2.4)

## 2017-01-23 MED ORDER — MAGNESIUM SULFATE 2 GM/50ML IV SOLN
2.0000 g | Freq: Once | INTRAVENOUS | Status: AC
Start: 2017-01-23 — End: 2017-01-23
  Administered 2017-01-23: 2 g via INTRAVENOUS
  Filled 2017-01-23: qty 50

## 2017-01-23 MED ORDER — IPRATROPIUM-ALBUTEROL 0.5-2.5 (3) MG/3ML IN SOLN: 3.0000 mL | Freq: Four times a day (QID) | RESPIRATORY_TRACT | Status: DC | PRN

## 2017-01-23 MED ORDER — METHYLPREDNISOLONE SODIUM SUCC 40 MG IJ SOLR
40.0000 mg | Freq: Every day | INTRAMUSCULAR | Status: DC
  Administered 2017-01-23 – 2017-01-24 (×2): 40 mg via INTRAVENOUS
  Filled 2017-01-23 (×2): qty 1

## 2017-01-23 MED ORDER — IPRATROPIUM-ALBUTEROL 0.5-2.5 (3) MG/3ML IN SOLN
3.0000 mL | Freq: Three times a day (TID) | RESPIRATORY_TRACT | Status: DC
  Administered 2017-01-23 – 2017-01-24 (×5): 3 mL via RESPIRATORY_TRACT
  Filled 2017-01-23 (×5): qty 3

## 2017-01-23 NOTE — Progress Notes (Signed)
Patient ID: Jacob Villanueva, male   DOB: 12/19/1950, 66 y.o.   MRN: [id number]   [employer] PROGRESS NOTE  Jacob Villanueva [id number] DOB: 11/30/1950 DOA: 01/19/2017 PCP: Patient, No Pcp Per  HPI/Subjective: Patient upset about being in the hospital and wants to get out of the hospital and go back to his living facility. Still with some cough. I had him sit up in the chair for an hour or so and then came back and listened to his lungs and then right after that he wanted to get back into the bed.  Objective: Vitals:   01/23/17 0348 01/23/17 1241  BP: 134/85 (!) 151/92  Pulse: 74 99  Resp: 18 20  Temp: 97.6 F (36.4 C) 97.9 F (36.6 C)    Filed Weights   01/19/17 1258 01/19/17 1800  Weight: 90.3 kg (199 lb) 88.8 kg (195 lb 12.3 oz)    ROS: Review of Systems  Constitutional: Negative for chills and fever.  Eyes: Negative for blurred vision.  Respiratory: Positive for cough. Negative for shortness of breath.   Cardiovascular: Negative for chest pain.  Gastrointestinal: Negative for abdominal pain, constipation, diarrhea, nausea and vomiting.  Genitourinary: Negative for dysuria.  Musculoskeletal: Negative for joint pain.  Neurological: Negative for dizziness and headaches.   Exam: Physical Exam  HENT:  Nose: No mucosal edema.  Mouth/Throat: No oropharyngeal exudate or posterior oropharyngeal edema.  Eyes: Conjunctivae, EOM and lids are normal. Pupils are equal, round, and reactive to light.  Neck: No JVD present. Carotid bruit is not present. No edema present. No thyroid mass and no thyromegaly present.  Cardiovascular: S1 normal, S2 normal and normal heart sounds.  Exam reveals no gallop.   No murmur heard. Pulses:      Dorsalis pedis pulses are 2+ on the right side, and 2+ on the left side.  Respiratory: No respiratory distress. He has decreased breath sounds in the right lower field and the left lower field. He has wheezes in the right middle field and the left middle field. He  has rhonchi in the right middle field, the right lower field, the left middle field and the left lower field. He has no rales.  GI: Soft. Bowel sounds are normal. There is no tenderness.  Musculoskeletal:       Right ankle: He exhibits no swelling.       Left ankle: He exhibits no swelling.  Lymphadenopathy:    He has no cervical adenopathy.  Neurological: He is alert. No cranial nerve deficit.  Skin: Skin is warm. No rash noted. Nails show no clubbing.  Psychiatric: He has a normal mood and affect.      Data Reviewed: Basic Metabolic Panel:  Recent Labs Lab 01/19/17 1239 01/19/17 2148 01/20/17 0508 01/20/17 0619 01/21/17 1010 01/22/17 0425 01/23/17 0524  NA 140  --  132*  --  135 138 135  K 2.6* 2.9* 2.8*  --  3.4* 3.6 4.3  CL 98*  --  102  --  102 105 103  CO2 23  --  23  --  25 27 27   GLUCOSE 179*  --  107*  --  106* 97 127*  BUN 28*  --  25*  --  14 12 12   CREATININE 3.63*  --  1.82*  --  0.89 0.97 0.87  CALCIUM 9.5  --  7.2*  --  8.1* 8.0* 8.4*  MG 2.4  --   --  1.8 1.8 1.6* 1.7  PHOS  --   --   --   --  1.7* 2.3* 2.7   Liver Function Tests:  Recent Labs Lab 01/19/17 1239 01/20/17 0508 01/22/17 0425 01/23/17 0524  AST 126* 375* 306* 175*  ALT 25 58 84* 79*  ALKPHOS 88 57 64 74  BILITOT 1.8* 1.0 0.7 0.6  PROT 8.2* 5.6* 5.3* 5.9*  ALBUMIN 4.6 3.2* 2.9* 3.1*    Recent Labs Lab 01/19/17 1239  LIPASE 53*    Recent Labs Lab 01/19/17 1806  AMMONIA 18   CBC:  Recent Labs Lab 01/19/17 1239 01/20/17 0508 01/23/17 0524  WBC 24.0* 15.7* 11.0*  NEUTROABS 21.3*  --   --   HGB 16.8 12.6* 13.3  HCT 49.1 36.4* 37.8*  MCV 92.6 92.8 94.1  PLT 264 176 217   Cardiac Enzymes:  Recent Labs Lab 01/19/17 1239  TROPONINI 0.05*   BNP (last 3 results)  Recent Labs  01/19/17 1239  BNP 28.0     CBG:  Recent Labs Lab 01/19/17 1758  GLUCAP 156*    Recent Results (from the past 240 hour(s))  Culture, blood (Routine x 2)     Status: Abnormal    Collection Time: 01/19/17 12:46 PM  Result Value Ref Range Status   Specimen Description BLOOD R HAND  Final   Special Requests   Final    BOTTLES DRAWN AEROBIC AND ANAEROBIC Blood Culture results may not be optimal due to an excessive volume of blood received in culture bottles   Culture  Setup Time   Final    GRAM POSITIVE COCCI IN BOTH AEROBIC AND ANAEROBIC BOTTLES CRITICAL RESULT CALLED TO, READ BACK BY AND VERIFIED WITH: KAREN HAYES 01/20/17 0945 SGD    Culture (A)  Final    STAPHYLOCOCCUS HOMINIS SUSCEPTIBILITIES PERFORMED ON PREVIOUS CULTURE WITHIN THE LAST 5 DAYS. Performed at [facility], [address], [place], [place] [address]    Report Status 01/22/2017 FINAL  Final  Culture, blood (Routine x 2)     Status: Abnormal   Collection Time: 01/19/17 12:46 PM  Result Value Ref Range Status   Specimen Description BLOOD R ARM  Final   Special Requests   Final    BOTTLES DRAWN AEROBIC AND ANAEROBIC Blood Culture adequate volume   Culture  Setup Time   Final    GRAM POSITIVE COCCI AEROBIC BOTTLE ONLY CRITICAL VALUE NOTED.  VALUE IS CONSISTENT WITH PREVIOUSLY REPORTED AND CALLED VALUE.    Culture STAPHYLOCOCCUS HOMINIS (A)  Final   Report Status 01/22/2017 FINAL  Final   Organism ID, Bacteria STAPHYLOCOCCUS HOMINIS  Final      Susceptibility   Staphylococcus hominis - MIC*    CIPROFLOXACIN <=0.5 SENSITIVE Sensitive     ERYTHROMYCIN <=0.25 SENSITIVE Sensitive     GENTAMICIN <=0.5 SENSITIVE Sensitive     OXACILLIN <=0.25 SENSITIVE Sensitive     TETRACYCLINE <=1 SENSITIVE Sensitive     VANCOMYCIN <=0.5 SENSITIVE Sensitive     TRIMETH/SULFA <=10 SENSITIVE Sensitive     CLINDAMYCIN <=0.25 SENSITIVE Sensitive     RIFAMPIN <=0.5 SENSITIVE Sensitive     Inducible Clindamycin NEGATIVE Sensitive     * STAPHYLOCOCCUS HOMINIS  Blood Culture ID Panel (Reflexed)     Status: Abnormal   Collection Time: 01/19/17 12:46 PM  Result Value Ref Range Status   Enterococcus species  NOT DETECTED NOT DETECTED Final   Listeria monocytogenes NOT DETECTED NOT DETECTED Final   Staphylococcus species DETECTED (A) NOT DETECTED Final    Comment: Methicillin (oxacillin) susceptible coagulase negative staphylococcus. Possible blood culture contaminant (  unless isolated from more than one blood culture draw or clinical case suggests pathogenicity). No antibiotic treatment is indicated for blood  culture contaminants. CRITICAL RESULT CALLED TO, READ BACK BY AND VERIFIED WITH: KAREN HAYES 01/20/17 0945 SGD    Staphylococcus aureus NOT DETECTED NOT DETECTED Final   Methicillin resistance NOT DETECTED NOT DETECTED Final   Streptococcus species NOT DETECTED NOT DETECTED Final   Streptococcus agalactiae NOT DETECTED NOT DETECTED Final   Streptococcus pneumoniae NOT DETECTED NOT DETECTED Final   Streptococcus pyogenes NOT DETECTED NOT DETECTED Final   Acinetobacter baumannii NOT DETECTED NOT DETECTED Final   Enterobacteriaceae species NOT DETECTED NOT DETECTED Final   Enterobacter cloacae complex NOT DETECTED NOT DETECTED Final   Escherichia coli NOT DETECTED NOT DETECTED Final   Klebsiella oxytoca NOT DETECTED NOT DETECTED Final   Klebsiella pneumoniae NOT DETECTED NOT DETECTED Final   Proteus species NOT DETECTED NOT DETECTED Final   Serratia marcescens NOT DETECTED NOT DETECTED Final   Haemophilus influenzae NOT DETECTED NOT DETECTED Final   Neisseria meningitidis NOT DETECTED NOT DETECTED Final   Pseudomonas aeruginosa NOT DETECTED NOT DETECTED Final   Candida albicans NOT DETECTED NOT DETECTED Final   Candida glabrata NOT DETECTED NOT DETECTED Final   Candida krusei NOT DETECTED NOT DETECTED Final   Candida parapsilosis NOT DETECTED NOT DETECTED Final   Candida tropicalis NOT DETECTED NOT DETECTED Final  Urine culture     Status: None   Collection Time: 01/19/17  1:36 PM  Result Value Ref Range Status   Specimen Description URINE, RANDOM  Final   Special Requests NONE  Final    Culture   Final    NO GROWTH Performed at [facility], [address], [place], [place] [address]    Report Status 01/20/2017 FINAL  Final  MRSA PCR Screening     Status: None   Collection Time: 01/19/17  5:29 PM  Result Value Ref Range Status   MRSA by PCR NEGATIVE NEGATIVE Final    Comment:        The GeneXpert MRSA Assay (FDA approved for NASAL specimens only), is one component of a comprehensive MRSA colonization surveillance program. It is not intended to diagnose MRSA infection nor to guide or monitor treatment for MRSA infections.       Scheduled Meds: . allopurinol  100 mg Oral Daily  . budesonide (PULMICORT) nebulizer solution  0.5 mg Nebulization BID  . enoxaparin (LOVENOX) injection  40 mg Subcutaneous Q24H  . ipratropium-albuterol  3 mL Nebulization TID  . magnesium oxide  400 mg Oral BID  . mouth rinse  15 mL Mouth Rinse BID  . methylPREDNISolone (SOLU-MEDROL) injection  40 mg Intravenous Daily  . metroNIDAZOLE  500 mg Oral Q8H  . pantoprazole  40 mg Oral Daily  . sertraline  50 mg Oral q morning - 10a   Continuous Infusions: . ceFEPIme (MAXIPIME) 1 GM IVPB Stopped (01/23/17 0754)    Assessment/Plan:  1. Clinical sepsis secondary to suspected pneumonia. Patient is on cefepime and Flagyl as per ID. Vancomycin discontinued since MRSA PCR was negative. Staphylococcus hominis growing in the blood culture unsure if this is a contamination or not. Hearing more rhonchi in the lung. Switch prednisone over to Solu-Medrol today. Continue nebulizer treatments with budesonide and DuoNeb. Reevaluate tomorrow for potential discharge. Case discussed with Dr. Sampson Goon infectious disease and he will adjust antibiotics. 2. Hypokalemia. Replaced 3. Hypomagnesemia replace magnesium IV today and continue oral supplementation 4. Hypophosphatemia replaced 5.  Acute encephalopathy secondary to sepsis. 6. Acute kidney injury. Creatinine has normalized with IV fluid  hydration. IV fluids stopped. 7. History of traumatic brain injury 8. History of hypertension. Blood pressure stable off medications 9. History of GERD on PPI 10. History of gout on allopurinol 11. Left toenail on first big toe fell off 12. Elevated liver function tests.  AST and ALT still elevated. Imaging studies unremarkable. Send a hepatitis profile off tomorrow. Continue to monitor liver function tests tomorrow. 13. Weakness. Patient will end up going to rehabilitation hopefully tomorrow  Code Status:     Code Status Orders        Start     Ordered   01/19/17 1803  Full code  Continuous     01/19/17 1802    Code Status History    Date Active Date Inactive Code Status Order ID Comments User Context   This patient has a current code status but no historical code status.    Advance Directive Documentation     Most Recent Value  Type of Advance Directive  Healthcare Power of Attorney  Pre-existing out of facility DNR order (yellow form or pink MOST form)  -  "MOST" Form in Place?  -     Disposition Plan: Physical therapy recommended rehabilitation.  Consultants:  Infectious disease  Antibiotics:  Cefepime  Flagyl  Time spent: 25 minutes. Case discussed with sister who is the POA on the phone again today.  Alford Highland  [employer]           Patient ID: Jacob Villanueva, male   DOB: Jul 26, 1951, 66 y.o.   MRN: [id number]

## 2017-01-23 NOTE — Progress Notes (Signed)
[facility] CLINIC INFECTIOUS DISEASE PROGRESS NOTE Date of Admission:  01/19/2017     ID: Jacob Villanueva is a 66 y.o. male with sepsis  Active Problems:   Acute encephalopathy   Subjective: No complaints but not reliable historian. No fevers.   ROS  Unable to obtain  Medications:  Antibiotics Given (last 72 hours)    Date/Time Action Medication Dose Rate   01/20/17 1712 Given   metroNIDAZOLE (FLAGYL) tablet 500 mg 500 mg    01/21/17 0006 Given   metroNIDAZOLE (FLAGYL) tablet 500 mg 500 mg    01/21/17 0929 New Bag/Given   ceFEPIme (MAXIPIME) 1 g in dextrose 5 % 50 mL IVPB 1 g 100 mL/hr   01/21/17 1348 Given   metroNIDAZOLE (FLAGYL) tablet 500 mg 500 mg    01/21/17 2008 Given  [patient preference]   metroNIDAZOLE (FLAGYL) tablet 500 mg 500 mg    01/21/17 2035 New Bag/Given  [patient preference]   ceFEPIme (MAXIPIME) 1 g in dextrose 5 % 50 mL IVPB 1 g 100 mL/hr   01/22/17 0452 Given  [patient preference]   metroNIDAZOLE (FLAGYL) tablet 500 mg 500 mg    01/22/17 1159 New Bag/Given   ceFEPIme (MAXIPIME) 1 g in dextrose 5 % 50 mL IVPB 1 g 100 mL/hr   01/22/17 1457 Given   metroNIDAZOLE (FLAGYL) tablet 500 mg 500 mg    01/22/17 2127 Given   metroNIDAZOLE (FLAGYL) tablet 500 mg 500 mg    01/22/17 2127 New Bag/Given   ceFEPIme (MAXIPIME) 1 g in dextrose 5 % 50 mL IVPB 1 g 100 mL/hr   01/23/17 0641 Given   metroNIDAZOLE (FLAGYL) tablet 500 mg 500 mg    01/23/17 0641 New Bag/Given   ceFEPIme (MAXIPIME) 1 g in dextrose 5 % 50 mL IVPB 1 g 100 mL/hr   01/23/17 1400 Given   metroNIDAZOLE (FLAGYL) tablet 500 mg 500 mg    01/23/17 1430 New Bag/Given   ceFEPIme (MAXIPIME) 1 g in dextrose 5 % 50 mL IVPB 1 g 100 mL/hr     . allopurinol  100 mg Oral Daily  . budesonide (PULMICORT) nebulizer solution  0.5 mg Nebulization BID  . enoxaparin (LOVENOX) injection  40 mg Subcutaneous Q24H  . ipratropium-albuterol  3 mL Nebulization TID  . magnesium oxide  400 mg Oral BID  . mouth rinse  15 mL  Mouth Rinse BID  . methylPREDNISolone (SOLU-MEDROL) injection  40 mg Intravenous Daily  . metroNIDAZOLE  500 mg Oral Q8H  . pantoprazole  40 mg Oral Daily  . sertraline  50 mg Oral q morning - 10a    Objective: Vital signs in last 24 hours: Temp:  [97.5 F (36.4 C)-97.9 F (36.6 C)] 97.9 F (36.6 C) (06/11 1241) Pulse Rate:  [67-99] 99 (06/11 1241) Resp:  [18-20] 20 (06/11 1241) BP: (114-151)/(66-92) 151/92 (06/11 1241) SpO2:  [97 %-100 %] 100 % (06/11 1358) Constitutional: He is awake but somewhat agitated and not oriented HENT: anicteric Mouth/Throat: Oropharynx is clear and dry . No oropharyngeal exudate. Does have white spot on hard palate Cardiovascular: Normal rate, regular rhythm and normal heart sounds. Exam reveals no gallop and no friction rub.  Pulmonary/Chest: bil rhonchi Abdominal: Soft. Mild distention Bowel sounds are normal. There is mild nonfocal tenderness.  Lymphadenopathy: He has no cervical adenopathy.  Neurological:e is awake but somewhat agitated and not oriented Skin: Skin is warm and dry. No rash noted. No erythema.  Psychiatric: agitated  Lab Results  Recent Labs  01/22/17 0425  01/23/17 0524  WBC  --  11.0*  HGB  --  13.3  HCT  --  37.8*  NA 138 135  K 3.6 4.3  CL 105 103  CO2 27 27  BUN 12 12  CREATININE 0.97 0.87    Microbiology: Results for orders placed or performed during the hospital encounter of 01/19/17  Culture, blood (Routine x 2)     Status: Abnormal   Collection Time: 01/19/17 12:46 PM  Result Value Ref Range Status   Specimen Description BLOOD R HAND  Final   Special Requests   Final    BOTTLES DRAWN AEROBIC AND ANAEROBIC Blood Culture results may not be optimal due to an excessive volume of blood received in culture bottles   Culture  Setup Time   Final    GRAM POSITIVE COCCI IN BOTH AEROBIC AND ANAEROBIC BOTTLES CRITICAL RESULT CALLED TO, READ BACK BY AND VERIFIED WITH: KAREN HAYES 01/20/17 0945 SGD    Culture (A)  Final     STAPHYLOCOCCUS HOMINIS SUSCEPTIBILITIES PERFORMED ON PREVIOUS CULTURE WITHIN THE LAST 5 DAYS. Performed at [facility], [address] [place], [place] [address]    Report Status 01/22/2017 FINAL  Final  Culture, blood (Routine x 2)     Status: Abnormal   Collection Time: 01/19/17 12:46 PM  Result Value Ref Range Status   Specimen Description BLOOD R ARM  Final   Special Requests   Final    BOTTLES DRAWN AEROBIC AND ANAEROBIC Blood Culture adequate volume   Culture  Setup Time   Final    GRAM POSITIVE COCCI AEROBIC BOTTLE ONLY CRITICAL VALUE NOTED.  VALUE IS CONSISTENT WITH PREVIOUSLY REPORTED AND CALLED VALUE.    Culture STAPHYLOCOCCUS HOMINIS (A)  Final   Report Status 01/22/2017 FINAL  Final   Organism ID, Bacteria STAPHYLOCOCCUS HOMINIS  Final      Susceptibility   Staphylococcus hominis - MIC*    CIPROFLOXACIN <=0.5 SENSITIVE Sensitive     ERYTHROMYCIN <=0.25 SENSITIVE Sensitive     GENTAMICIN <=0.5 SENSITIVE Sensitive     OXACILLIN <=0.25 SENSITIVE Sensitive     TETRACYCLINE <=1 SENSITIVE Sensitive     VANCOMYCIN <=0.5 SENSITIVE Sensitive     TRIMETH/SULFA <=10 SENSITIVE Sensitive     CLINDAMYCIN <=0.25 SENSITIVE Sensitive     RIFAMPIN <=0.5 SENSITIVE Sensitive     Inducible Clindamycin NEGATIVE Sensitive     * STAPHYLOCOCCUS HOMINIS  Blood Culture ID Panel (Reflexed)     Status: Abnormal   Collection Time: 01/19/17 12:46 PM  Result Value Ref Range Status   Enterococcus species NOT DETECTED NOT DETECTED Final   Listeria monocytogenes NOT DETECTED NOT DETECTED Final   Staphylococcus species DETECTED (A) NOT DETECTED Final    Comment: Methicillin (oxacillin) susceptible coagulase negative staphylococcus. Possible blood culture contaminant (unless isolated from more than one blood culture draw or clinical case suggests pathogenicity). No antibiotic treatment is indicated for blood  culture contaminants. CRITICAL RESULT CALLED TO, READ BACK BY AND VERIFIED  WITH: KAREN HAYES 01/20/17 0945 SGD    Staphylococcus aureus NOT DETECTED NOT DETECTED Final   Methicillin resistance NOT DETECTED NOT DETECTED Final   Streptococcus species NOT DETECTED NOT DETECTED Final   Streptococcus agalactiae NOT DETECTED NOT DETECTED Final   Streptococcus pneumoniae NOT DETECTED NOT DETECTED Final   Streptococcus pyogenes NOT DETECTED NOT DETECTED Final   Acinetobacter baumannii NOT DETECTED NOT DETECTED Final   Enterobacteriaceae species NOT DETECTED NOT DETECTED Final   Enterobacter cloacae complex  NOT DETECTED NOT DETECTED Final   Escherichia coli NOT DETECTED NOT DETECTED Final   Klebsiella oxytoca NOT DETECTED NOT DETECTED Final   Klebsiella pneumoniae NOT DETECTED NOT DETECTED Final   Proteus species NOT DETECTED NOT DETECTED Final   Serratia marcescens NOT DETECTED NOT DETECTED Final   Haemophilus influenzae NOT DETECTED NOT DETECTED Final   Neisseria meningitidis NOT DETECTED NOT DETECTED Final   Pseudomonas aeruginosa NOT DETECTED NOT DETECTED Final   Candida albicans NOT DETECTED NOT DETECTED Final   Candida glabrata NOT DETECTED NOT DETECTED Final   Candida krusei NOT DETECTED NOT DETECTED Final   Candida parapsilosis NOT DETECTED NOT DETECTED Final   Candida tropicalis NOT DETECTED NOT DETECTED Final  Urine culture     Status: None   Collection Time: 01/19/17  1:36 PM  Result Value Ref Range Status   Specimen Description URINE, RANDOM  Final   Special Requests NONE  Final   Culture   Final    NO GROWTH Performed at [facility], [address], [place], [place] [address]    Report Status 01/20/2017 FINAL  Final  MRSA PCR Screening     Status: None   Collection Time: 01/19/17  5:29 PM  Result Value Ref Range Status   MRSA by PCR NEGATIVE NEGATIVE Final    Comment:        The GeneXpert MRSA Assay (FDA approved for NASAL specimens only), is one component of a comprehensive MRSA colonization surveillance program. It is not intended to  diagnose MRSA infection nor to guide or monitor treatment for MRSA infections.     Studies/Results: No results found.  Assessment/Plan: Jacob Villanueva is a 66 y.o. male with TBI at facility , admitted with AMS, found down. He tells me today that he was vomiting at home but unclear if he is an accurate historian. On admit wbc 24, temp 99.  UA negative. CT chest with mild bilat consolidation collapse and bronchial wall thickening. CT abd with marked GB distention. AST eleveated. Cr 3.6 but down to 1.8. RUQ USS neg.  Hypoxic so placed on O2. Clinically improved and transferred to floor. WBC down to 1q.  MRA PCR neg, 2/2 BCX + Staph hominis..  No obvious etiology but I suspect he had an aspiration event or perhaps passed a gallstone.  The coag neg staph is likely contamination as no indwelling devices or pacemakers.  Day 5 of abx Recommendations Repeat bcx today. If remains afebrile can dc in AM on keflex 500 mg bid and flagyl 500 mg bid for a another 5 days  Thank you very much for the consult. Will follow with you.  Ralene Gasparyan P   01/23/2017, 5:05 PM

## 2017-01-23 NOTE — Care Management (Signed)
Admitted to [facility] with the diagnosis of acute encephalopathy. Lives at [facility]. Sister is Pam [phone]). No primary care listed.  Spoke with Mr. Jacklynn GanongOja at the bedside. States he has been living at [facility] "too [facility]." States he has no drug store and sees no primary care physician. No medical equipment in the home. 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 States he has been falling prior to this admission. Left information about physician's accepting new patient in the room Gwenette GreetBrenda S Rivan Siordia RN MSN CCM Care Management [phone]

## 2017-01-23 NOTE — Care Management Important Message (Signed)
Important Message  Patient Details  Name: Jacob Villanueva MRN: [id number] Date of Birth: 02/14/1951   Medicare Important Message Given:  Yes    Gwenette GreetBrenda S Yoneko Talerico, RN 01/23/2017, 8:46 AM

## 2017-01-23 NOTE — Clinical Social Work Note (Signed)
CSW spoke with sister to provide bed offers. Pt's sister chose [facility]. CSW will initiate [employer] Auth. Per MD, pt will likely be ready for discharge tomorrow. Facility is ready to accept pt when stable. CSW will continue to follow.   Dede QuerySarah Lanell Dubie, MSW, LCSW  Clinical Social Worker [phone]

## 2017-01-23 NOTE — Plan of Care (Signed)
Problem: Safety: Goal: Ability to remain free from injury will improve Outcome: Progressing High fall risk; Low bed in use; Bed alarm activated. No injuries this shift.

## 2017-01-23 NOTE — Progress Notes (Signed)
MEDICATION RELATED CONSULT NOTE   Pharmacy Consult for electrolyte management  Indication: hypokalemia   Pharmacy consulted for electrolyte management for 66 yo male being treated for hypokalemia.  All electrolytes within normal limits.   Current orders for KCl 20mEq PO BID   Plan:  No further supplemetation warranted @ this time.     Allergies  Allergen Reactions  . Clindamycin/Lincomycin Diarrhea  . Effexor [Venlafaxine Hcl]     DELUSIONAL   . Amoxicillin Rash   BMP Latest Ref Rng & Units 01/23/2017 01/22/2017 01/21/2017  Glucose 65 - 99 mg/dL 191(Y127(H) 97 782(N106(H)  BUN 6 - 20 mg/dL 12 12 14   Creatinine 0.61 - 1.24 mg/dL 5.620.87 1.300.97 8.650.89  Sodium 135 - 145 mmol/L 135 138 135  Potassium 3.5 - 5.1 mmol/L 4.3 3.6 3.4(L)  Chloride 101 - 111 mmol/L 103 105 102  CO2 22 - 32 mmol/L 27 27 25   Calcium 8.9 - 10.3 mg/dL 7.8(I8.4(L) 6.9(G8.0(L) 8.1(L)     Patient Measurements: Height: 6\' 4"  (193 cm) Weight: 195 lb 12.3 oz (88.8 kg) IBW/kg (Calculated) : 86.8   Vital Signs: Temp: 97.6 F (36.4 C) (06/11 0348) Temp Source: Oral (06/11 0348) BP: 134/85 (06/11 0348) Pulse Rate: 74 (06/11 0348) Intake/Output from previous day: 06/10 0701 - 06/11 0700 In: 1850 [P.O.:1800; IV Piggyback:50] Out: 1 [Emesis/NG output:1] Intake/Output from this shift: No intake/output data recorded.  Labs:  Recent Labs  01/21/17 1010 01/22/17 0425 01/23/17 0524  WBC  --   --  11.0*  HGB  --   --  13.3  HCT  --   --  37.8*  PLT  --   --  217  CREATININE 0.89 0.97 0.87  MG 1.8 1.6* 1.7  PHOS 1.7* 2.3* 2.7  ALBUMIN  --  2.9* 3.1*  PROT  --  5.3* 5.9*  AST  --  306* 175*  ALT  --  84* 79*  ALKPHOS  --  64 74  BILITOT  --  0.7 0.6   Estimated Creatinine Clearance: 99.8 mL/min (by C-G formula based on SCr of 0.87 mg/dL).   Pharmacy will continue to monitor and adjust per consult.   Masami Plata D 01/23/2017,9:25 AM

## 2017-01-24 LAB — HEPATITIS PANEL, ACUTE
HCV Ab: <0.1 s/co ratio (ref 0.0–0.9)
HEP A IGM: NEGATIVE
HEP B C IGM: NEGATIVE
HEP B S AG: NEGATIVE

## 2017-01-24 MED ORDER — CEPHALEXIN 500 MG PO CAPS
500.0000 mg | ORAL_CAPSULE | Freq: Three times a day (TID) | ORAL | Status: DC
  Administered 2017-01-24 (×2): 500 mg via ORAL
  Filled 2017-01-24 (×2): qty 1

## 2017-01-24 MED ORDER — IPRATROPIUM-ALBUTEROL 0.5-2.5 (3) MG/3ML IN SOLN: 3.0000 mL | Freq: Three times a day (TID) | RESPIRATORY_TRACT | 0 refills | Status: DC

## 2017-01-24 MED ORDER — PREDNISONE 10 MG PO TABS: ORAL_TABLET | ORAL | 0 refills | Status: DC

## 2017-01-24 MED ORDER — CEPHALEXIN 500 MG PO CAPS: 500.0000 mg | ORAL_CAPSULE | Freq: Three times a day (TID) | ORAL | 0 refills | Status: DC

## 2017-01-24 MED ORDER — NICOTINE 21 MG/24HR TD PT24: 21.0000 mg | MEDICATED_PATCH | TRANSDERMAL | 0 refills | Status: DC

## 2017-01-24 MED ORDER — AMLODIPINE BESYLATE 5 MG PO TABS
5.0000 mg | ORAL_TABLET | Freq: Every day | ORAL | Status: DC
  Administered 2017-01-24: 10:00:00 5 mg via ORAL
  Filled 2017-01-24: qty 1

## 2017-01-24 MED ORDER — GABAPENTIN 300 MG PO CAPS: 300.0000 mg | ORAL_CAPSULE | Freq: Two times a day (BID) | ORAL | 0 refills | Status: DC

## 2017-01-24 MED ORDER — MAGNESIUM OXIDE 400 (241.3 MG) MG PO TABS: 400.0000 mg | ORAL_TABLET | Freq: Two times a day (BID) | ORAL | 0 refills | Status: DC

## 2017-01-24 MED ORDER — METRONIDAZOLE 500 MG PO TABS: 500.0000 mg | ORAL_TABLET | Freq: Three times a day (TID) | ORAL | 0 refills | Status: DC

## 2017-01-24 MED ORDER — AMLODIPINE BESYLATE 5 MG PO TABS: 5.0000 mg | ORAL_TABLET | Freq: Every day | ORAL | 0 refills | Status: DC

## 2017-01-24 NOTE — Clinical Social Work Placement (Signed)
   CLINICAL SOCIAL WORK PLACEMENT  NOTE  Date:  01/24/2017  Patient Details  Name: Jacob Villanueva MRN: [id number] Date of Birth: 01/20/1951  Clinical Social Work is seeking post-discharge placement for this patient at the Skilled  Nursing Facility level of care (*CSW will initial, date and re-position this form in  chart as items are completed):  Yes   Patient/family provided with [facility] Clinical Social Work Department's list of facilities offering this level of care within the geographic area requested by the patient (or if unable, by the patient's family).  Yes   Patient/family informed of their freedom to choose among providers that offer the needed level of care, that participate in Medicare, Medicaid or managed care program needed by the patient, have an available bed and are willing to accept the patient.  Yes   Patient/family informed of [facility]'s ownership interest in [facility] and [facility], as well as of the fact that they are under no obligation to receive care at these facilities.  PASRR submitted to EDS on 01/22/17     PASRR number received on 01/22/17     Existing PASRR number confirmed on       FL2 transmitted to all facilities in geographic area requested by pt/family on 01/22/17     FL2 transmitted to all facilities within larger geographic area on       Patient informed that his/her managed care company has contracts with or will negotiate with certain facilities, including the following:        Yes   Patient/family informed of bed offers received.  Patient chooses bed at [facility] [place]     Physician recommends and patient chooses bed at      Patient to be transferred to [facility] [place] on 01/24/17.  Patient to be transferred to facility by [facility]     Patient family notified on 01/24/17 of transfer.  Name of family member notified:  Pt's sister, Jacob Villanueva     PHYSICIAN       Additional Comment:     _______________________________________________ Dede QuerySarah Meldon Hanzlik, LCSW 01/24/2017, 3:05 PM

## 2017-01-24 NOTE — Clinical Social Work Note (Signed)
Pt is ready for discharge today and will go to [facility]. Pt's sister is aware and agreeable to discharge plan. [employer] has given auth [id number](#[id number], RUG RVA, 3 days, next review date is 6/14, 565 minutes therapy a week). Faciltiy is ready to admit pt as they have received discharge information. RN will call report. [facility]. CSW is signing off as no further needs identified.   Dede QuerySarah Sitlaly Gudiel, MSW, LCSW  Clinical Social Worker [phone]

## 2017-01-24 NOTE — Plan of Care (Signed)
Problem: Education: Goal: Knowledge of [facility] Education information/materials will improve Outcome: Progressing VSS, free of falls during shift.  Reported chronic back pain 9/10, received PRN PO Tylenol 650mg  x1, no add'l interventions needed.  No other needs overnight.  Bed in low position, call bell within reach.  WCTM.

## 2017-01-24 NOTE — Progress Notes (Signed)
Physical Therapy Treatment Patient Details Name: Jacob Villanueva MRN: [id number] DOB: 11/25/1950 Today's Date: 01/24/2017    History of Present Illness Pt is a 66 y.o. male presenting to hospital with decreased level of responsiveness and general ill appearance.  CT chest showing B lower lobe consolidation vs atelectasis.  Pt admitted with acute hypoxic respiratory failure secondary ?PNA, acute renal failure, diarrhea, and AMS.  PMH includes TBI, htn, h/o fx surgery, and appendectomy.    PT Comments    Pt initially sleeping; easily awoken through voice and agreeable to PT. PT demonstrates bed mobility with mild increased time and use of rails. Pt able to stand with reach for upper extremity support; refuses use of rolling walker. Pt can maintain static stand for functional activity (use of urinal) with wide base of support without loss of balance. Ambulation of 140 ft again refusing use of rolling walker; despite overt unsteadiness. Pt use wide base of support with decreased step lengths and significant sway right/left. Pt also demonstrates some staggering primarily to the right requiring Min A to correct. Difficulty following directional instructions. Slow awkward turns with increased instability noted. Pt fatigued post walk. Continued recommendation for skilled nursing facility to progress endurance, ambulation quality and balance to improve functional mobility and improve safety to prevent falls.    Follow Up Recommendations  SNF     Equipment Recommendations  Rolling walker with 5" wheels    Recommendations for Other Services       Precautions / Restrictions Precautions Precautions: Fall Restrictions Weight Bearing Restrictions: No    Mobility  Bed Mobility Overal bed mobility: Modified Independent Bed Mobility: Supine to Sit;Sit to Supine     Supine to sit: Modified independent (Device/Increase time) Sit to supine: Modified independent (Device/Increase time)   General bed mobility  comments: Use of rails and mild increased time  Transfers Overall transfer level: Needs assistance Equipment used: None Transfers: Sit to/from Stand Sit to Stand: Min guard         General transfer comment: Reaches for upper extremity support at counter with wide BOS; refuses use of rw  Ambulation/Gait Ambulation/Gait assistance: Min guard;Min assist Ambulation Distance (Feet): 140 Feet Assistive device: None Gait Pattern/deviations: Step-through pattern;Decreased step length - right;Decreased step length - left;Decreased stride length;Wide base of support;Staggering right;Staggering left (significant sway R/L) Gait velocity: decreased Gait velocity interpretation: Below normal speed for age/gender General Gait Details: Refuses use of rw; occasional LOB requiring Min A and cues to correct mostly to the R. Little to no upper body arm/trunk swing or much attempt to correct LOB other than stagger feet. Difficulty with directional cues. Partial step through with wide BOS.    Stairs            Wheelchair Mobility    Modified Rankin (Stroke Patients Only)       Balance Overall balance assessment: Needs assistance Sitting-balance support: No upper extremity supported;Feet supported Sitting balance-Leahy Scale: Good     Standing balance support: No upper extremity supported Standing balance-Leahy Scale: Fair                              Cognition Arousal/Alertness: Awake/alert (Initially sleeping, able to awaken through voice) Behavior During Therapy: WFL for tasks assessed/performed Overall Cognitive Status: History of cognitive impairments - at baseline  General Comments: Hx of TBI      Exercises Other Exercises Other Exercises: static stand tolerance with functional activity; maintained with wide BOS    General Comments        Pertinent Vitals/Pain      Home Living                       Prior Function            PT Goals (current goals can now be found in the care plan section) Progress towards PT goals: Progressing toward goals    Frequency    Min 2X/week      PT Plan Current plan remains appropriate    Co-evaluation              AM-PAC PT "6 Clicks" Daily Activity  Outcome Measure  Difficulty turning over in bed (including adjusting bedclothes, sheets and blankets)?: None Difficulty moving from lying on back to sitting on the side of the bed? : A Little Difficulty sitting down on and standing up from a chair with arms (e.g., wheelchair, bedside commode, etc,.)?: A Little Help needed moving to and from a bed to chair (including a wheelchair)?: A Little Help needed walking in hospital room?: A Little Help needed climbing 3-5 steps with a railing? : A Little 6 Click Score: 19    End of Session Equipment Utilized During Treatment: Gait belt Activity Tolerance: Patient tolerated treatment well;Other (comment);Patient limited by fatigue (limited by decreased balance)     PT Visit Diagnosis: Unsteadiness on feet (R26.81);Muscle weakness (generalized) (M62.81);Difficulty in walking, not elsewhere classified (R26.2)     Time: 5621-3086 PT Time Calculation (min) (ACUTE ONLY): 20 min  Charges:  $Gait Training: 8-22 mins                    G Codes:        Scot Dock, PTA 01/24/2017, 10:21 AM

## 2017-01-24 NOTE — Discharge Summary (Addendum)
[facility] [place] [facility]   PATIENT NAME: Jacob Villanueva    MR#:  [id number]  DATE OF BIRTH:  09-09-1950  DATE OF ADMISSION:  01/19/2017 ADMITTING PHYSICIAN: Ramonita Lab, MD  DATE OF DISCHARGE: 01/24/2017  PRIMARY CARE PHYSICIAN: Patient, No Pcp Per    ADMISSION DIAGNOSIS:  Hypokalemia [E87.6] Bronchopneumonia [J18.0] Demand ischemia (HCC) [I24.8] History of traumatic brain injury [Z87.820] AKI (acute kidney injury) (HCC) [N17.9] Elevated lactic acid level [R79.89] Severe sepsis (HCC) [A41.9, R65.20] Acute renal failure, unspecified acute renal failure type (HCC) [N17.9] Diarrhea, unspecified type [R19.7]  DISCHARGE DIAGNOSIS:  Active Problems:   Acute encephalopathy   SECONDARY DIAGNOSIS:   Past Medical History:  Diagnosis Date  . Hypertension   . TBI (traumatic brain injury) [facility])     HOSPITAL COURSE:   1. Clinical sepsis secondary to pneumonia. Patient was given aggressive antibiotics with vancomycin and cefepime. Vancomycin was discontinued since his MRSA PCR was negative. Patient was seen in consultation by infectious disease and Flagyl was added. The patient did have Staphylococcus hominis growing out of blood culture. We are unsure if this is a contamination or not. Repeat blood cultures so far are negative to date. The patient started having some more wheeze and noises heard in the lungs and steroids were added and nebulizer treatments were given. 2. Hypokalemia this was replaced during the hospital course 3. Persistent hypomagnesemia. We were placing magnesium almost on a daily basis and oral supplementation given twice a day. Recommend checking magnesium and BMP in 1 week 4. Hypophosphatemia this was replaced IV and orally also. 5. Acute encephalopathy secondary to sepsis and history of traumatic brain injury. 6. Acute kidney injury. Creatinine has normalized with IV fluid hydration. 7. History of traumatic brain injury 8. History of  hypertension. Restart low-dose Norvasc 5 mg daily. 9. History of GERD on PPI 10. History of gout on allopurinol 11. Left toenail on first big toe fell off. 12. Elevated liver function tests. AST and ALT are trending better. Acute hepatitis profiles are negative. Imaging of the liver showed steatosis on CT scan and ultrasound. Gallstones were seen on sonogram. Recommend checking liver function test in 1 week. 13. Weakness. Physical therapy recommended rehabilitation 14. Borderline troponin is demand ischemia secondary to sepsis 15 family requesting nicotine patch.  DISCHARGE CONDITIONS:   Satisfactory  CONSULTS OBTAINED:  Treatment Team:  Mick Sell, MD  DRUG ALLERGIES:   Allergies  Allergen Reactions  . Clindamycin/Lincomycin Diarrhea  . Effexor [Venlafaxine Hcl]     DELUSIONAL   . Amoxicillin Rash    DISCHARGE MEDICATIONS:   Current Discharge Medication List    START taking these medications   Details  cephALEXin (KEFLEX) 500 MG capsule Take 1 capsule (500 mg total) by mouth every 8 (eight) hours. Qty: 15 capsule, Refills: 0    ipratropium-albuterol (DUONEB) 0.5-2.5 (3) MG/3ML SOLN Take 3 mLs by nebulization 3 (three) times daily. Qty: 360 mL, Refills: 0    magnesium oxide (MAG-OX) 400 (241.3 Mg) MG tablet Take 1 tablet (400 mg total) by mouth 2 (two) times daily. Qty: 60 tablet, Refills: 0    metroNIDAZOLE (FLAGYL) 500 MG tablet Take 1 tablet (500 mg total) by mouth every 8 (eight) hours. Qty: 15 tablet, Refills: 0    nicotine (NICODERM CQ - DOSED IN MG/24 HOURS) 21 mg/24hr patch Place 1 patch (21 mg total) onto the skin daily. Qty: 30 patch, Refills: 0    predniSONE (DELTASONE) 10 MG tablet 4 tabs daily  for 2 more days then stop Qty: 8 tablet, Refills: 0      CONTINUE these medications which have CHANGED   Details  amLODipine (NORVASC) 5 MG tablet Take 1 tablet (5 mg total) by mouth daily. Qty: 30 tablet, Refills: 0    gabapentin (NEURONTIN) 300  MG capsule Take 1 capsule (300 mg total) by mouth 2 (two) times daily. Qty: 60 capsule, Refills: 0      CONTINUE these medications which have NOT CHANGED   Details  allopurinol (ZYLOPRIM) 100 MG tablet Take 100 mg by mouth daily. Refills: 0    calcium carbonate (TUMS EX) 750 MG chewable tablet Chew 1 tablet by mouth 2 (two) times daily.    esomeprazole (NEXIUM) 40 MG capsule Take 40 mg by mouth 2 (two) times daily before a meal.     NON FORMULARY 1 Dose every 3 (three) months. Testa Pill. A testosterone pill that is implanted into side q 3 months    sertraline (ZOLOFT) 50 MG tablet Take 50 mg by mouth every morning. Refills: 0      STOP taking these medications     hydrochlorothiazide (HYDRODIURIL) 25 MG tablet      loratadine (CLARITIN) 10 MG tablet      ranitidine (ZANTAC) 150 MG tablet      simethicone (MYLICON) 125 MG chewable tablet      promethazine (PHENERGAN) 25 MG tablet          DISCHARGE INSTRUCTIONS:   Follow-up with Dr. rehabilitation one day  If you experience worsening of your admission symptoms, develop shortness of breath, life threatening emergency, suicidal or homicidal thoughts you must seek medical attention immediately by calling 911 or calling your MD immediately  if symptoms less severe.  You Must read complete instructions/literature along with all the possible adverse reactions/side effects for all the Medicines you take and that have been prescribed to you. Take any new Medicines after you have completely understood and accept all the possible adverse reactions/side effects.   Please note  You were cared for by a hospitalist during your hospital stay. If you have any questions about your discharge medications or the care you received while you were in the hospital after you are discharged, you can call the unit and asked to speak with the hospitalist on call if the hospitalist that took care of you is not available. Once you are discharged, your  primary care physician will handle any further medical issues. Please note that NO REFILLS for any discharge medications will be authorized once you are discharged, as it is imperative that you return to your primary care physician (or establish a relationship with a primary care physician if you do not have one) for your aftercare needs so that they can reassess your need for medications and monitor your lab values.    Today   CHIEF COMPLAINT:   Chief Complaint  Patient presents with  . Dehydration    HISTORY OF PRESENT ILLNESS:  Jacob Villanueva  is a 66 y.o. male came in with weakness and found to have clinical sepsis and pneumonia   VITAL SIGNS:  Blood pressure (!) 158/91, pulse 84, temperature 97.9 F (36.6 C), temperature source Oral, resp. rate 18, height 6\' 4"  (1.93 m), weight 88.8 kg (195 lb 12.3 oz), SpO2 94 %.   PHYSICAL EXAMINATION:  GENERAL:  66 y.o.-year-old patient lying in the bed with no acute distress.  EYES: Pupils equal, round, reactive to light and accommodation. No scleral icterus. Extraocular  muscles intact.  HEENT: Head atraumatic, normocephalic. Oropharynx and nasopharynx clear.  NECK:  Supple, no jugular venous distention. No thyroid enlargement, no tenderness.  LUNGS: Normal breath sounds bilaterally, no wheezing, rales,rhonchi or crepitation. No use of accessory muscles of respiration.  CARDIOVASCULAR: S1, S2 normal. No murmurs, rubs, or gallops.  ABDOMEN: Soft, non-tender, non-distended. Bowel sounds present. No organomegaly or mass.  EXTREMITIES: No pedal edema, cyanosis, or clubbing.  NEUROLOGIC: Cranial nerves II through XII are intact. Muscle strength 5/5 in all extremities. Sensation intact. Gait not checked.  PSYCHIATRIC: The patient is alert and Answers some yes or no questions appropriately.Marland Kitchen  SKIN: No obvious rash, lesion, or ulcer.   DATA REVIEW:   CBC  Recent Labs Lab 01/23/17 0524  WBC 11.0*  HGB 13.3  HCT 37.8*  PLT 217    Chemistries    Recent Labs Lab 01/23/17 0524  NA 135  K 4.3  CL 103  CO2 27  GLUCOSE 127*  BUN 12  CREATININE 0.87  CALCIUM 8.4*  MG 1.7  AST 175*  ALT 79*  ALKPHOS 74  BILITOT 0.6    Cardiac Enzymes  Recent Labs Lab 01/19/17 1239  TROPONINI 0.05*    Microbiology Results  Results for orders placed or performed during the hospital encounter of 01/19/17  Culture, blood (Routine x 2)     Status: Abnormal   Collection Time: 01/19/17 12:46 PM  Result Value Ref Range Status   Specimen Description BLOOD R HAND  Final   Special Requests   Final    BOTTLES DRAWN AEROBIC AND ANAEROBIC Blood Culture results may not be optimal due to an excessive volume of blood received in culture bottles   Culture  Setup Time   Final    GRAM POSITIVE COCCI IN BOTH AEROBIC AND ANAEROBIC BOTTLES CRITICAL RESULT CALLED TO, READ BACK BY AND VERIFIED WITH: KAREN HAYES 01/20/17 0945 SGD    Culture (A)  Final    STAPHYLOCOCCUS HOMINIS SUSCEPTIBILITIES PERFORMED ON PREVIOUS CULTURE WITHIN THE LAST 5 DAYS. Performed at [facility], [address] [place], [place] [address]    Report Status 01/22/2017 FINAL  Final  Culture, blood (Routine x 2)     Status: Abnormal   Collection Time: 01/19/17 12:46 PM  Result Value Ref Range Status   Specimen Description BLOOD R ARM  Final   Special Requests   Final    BOTTLES DRAWN AEROBIC AND ANAEROBIC Blood Culture adequate volume   Culture  Setup Time   Final    GRAM POSITIVE COCCI AEROBIC BOTTLE ONLY CRITICAL VALUE NOTED.  VALUE IS CONSISTENT WITH PREVIOUSLY REPORTED AND CALLED VALUE.    Culture STAPHYLOCOCCUS HOMINIS (A)  Final   Report Status 01/22/2017 FINAL  Final   Organism ID, Bacteria STAPHYLOCOCCUS HOMINIS  Final      Susceptibility   Staphylococcus hominis - MIC*    CIPROFLOXACIN <=0.5 SENSITIVE Sensitive     ERYTHROMYCIN <=0.25 SENSITIVE Sensitive     GENTAMICIN <=0.5 SENSITIVE Sensitive     OXACILLIN <=0.25 SENSITIVE Sensitive     TETRACYCLINE  <=1 SENSITIVE Sensitive     VANCOMYCIN <=0.5 SENSITIVE Sensitive     TRIMETH/SULFA <=10 SENSITIVE Sensitive     CLINDAMYCIN <=0.25 SENSITIVE Sensitive     RIFAMPIN <=0.5 SENSITIVE Sensitive     Inducible Clindamycin NEGATIVE Sensitive     * STAPHYLOCOCCUS HOMINIS  Blood Culture ID Panel (Reflexed)     Status: Abnormal   Collection Time: 01/19/17 12:46 PM  Result Value  Ref Range Status   Enterococcus species NOT DETECTED NOT DETECTED Final   Listeria monocytogenes NOT DETECTED NOT DETECTED Final   Staphylococcus species DETECTED (A) NOT DETECTED Final    Comment: Methicillin (oxacillin) susceptible coagulase negative staphylococcus. Possible blood culture contaminant (unless isolated from more than one blood culture draw or clinical case suggests pathogenicity). No antibiotic treatment is indicated for blood  culture contaminants. CRITICAL RESULT CALLED TO, READ BACK BY AND VERIFIED WITH: KAREN HAYES 01/20/17 0945 SGD    Staphylococcus aureus NOT DETECTED NOT DETECTED Final   Methicillin resistance NOT DETECTED NOT DETECTED Final   Streptococcus species NOT DETECTED NOT DETECTED Final   Streptococcus agalactiae NOT DETECTED NOT DETECTED Final   Streptococcus pneumoniae NOT DETECTED NOT DETECTED Final   Streptococcus pyogenes NOT DETECTED NOT DETECTED Final   Acinetobacter baumannii NOT DETECTED NOT DETECTED Final   Enterobacteriaceae species NOT DETECTED NOT DETECTED Final   Enterobacter cloacae complex NOT DETECTED NOT DETECTED Final   Escherichia coli NOT DETECTED NOT DETECTED Final   Klebsiella oxytoca NOT DETECTED NOT DETECTED Final   Klebsiella pneumoniae NOT DETECTED NOT DETECTED Final   Proteus species NOT DETECTED NOT DETECTED Final   Serratia marcescens NOT DETECTED NOT DETECTED Final   Haemophilus influenzae NOT DETECTED NOT DETECTED Final   Neisseria meningitidis NOT DETECTED NOT DETECTED Final   Pseudomonas aeruginosa NOT DETECTED NOT DETECTED Final   Candida albicans NOT  DETECTED NOT DETECTED Final   Candida glabrata NOT DETECTED NOT DETECTED Final   Candida krusei NOT DETECTED NOT DETECTED Final   Candida parapsilosis NOT DETECTED NOT DETECTED Final   Candida tropicalis NOT DETECTED NOT DETECTED Final  Urine culture     Status: None   Collection Time: 01/19/17  1:36 PM  Result Value Ref Range Status   Specimen Description URINE, RANDOM  Final   Special Requests NONE  Final   Culture   Final    NO GROWTH Performed at [facility], [address] [place], [place] [address]    Report Status 01/20/2017 FINAL  Final  MRSA PCR Screening     Status: None   Collection Time: 01/19/17  5:29 PM  Result Value Ref Range Status   MRSA by PCR NEGATIVE NEGATIVE Final    Comment:        The GeneXpert MRSA Assay (FDA approved for NASAL specimens only), is one component of a comprehensive MRSA colonization surveillance program. It is not intended to diagnose MRSA infection nor to guide or monitor treatment for MRSA infections.   Culture, blood (Routine X 2) w Reflex to ID Panel     Status: None (Preliminary result)   Collection Time: 01/23/17  3:15 PM  Result Value Ref Range Status   Specimen Description BLOOD LEFT HAND  Final   Special Requests   Final    BOTTLES DRAWN AEROBIC AND ANAEROBIC Blood Culture adequate volume   Culture NO GROWTH < 24 HOURS  Final   Report Status PENDING  Incomplete  Culture, blood (Routine X 2) w Reflex to ID Panel     Status: None (Preliminary result)   Collection Time: 01/23/17  3:15 PM  Result Value Ref Range Status   Specimen Description BLOOD RIGHT HAND  Final   Special Requests   Final    BOTTLES DRAWN AEROBIC AND ANAEROBIC Blood Culture adequate volume   Culture NO GROWTH < 24 HOURS  Final   Report Status PENDING  Incomplete    RADIOLOGY:  No results found.  EKG:   Orders placed or performed during the hospital encounter of 01/19/17  . EKG 12-Lead  . EKG 12-Lead  . EKG 12-Lead  . EKG 12-Lead       Management plans discussed with the patient, family and they are in agreement.  CODE STATUS:     Code Status Orders        Start     Ordered   01/19/17 1803  Full code  Continuous     01/19/17 1802    Code Status History    Date Active Date Inactive Code Status Order ID Comments User Context   This patient has a current code status but no historical code status.    Advance Directive Documentation     Most Recent Value  Type of Advance Directive  Healthcare Power of Attorney  Pre-existing out of facility DNR order (yellow form or pink MOST form)  -  "MOST" Form in Place?  -      TOTAL TIME TAKING CARE OF THIS PATIENT: 35 minutes.    Alford Highland M.D on 01/24/2017 at 3:09 PM  Between 7am to 6pm - Pager - [phone]  After 6pm go to [email or web address] - password [email or web address] [facility]  [facility] Office  [phone]  CC: Primary care physician; Patient, No Pcp Per

## 2017-01-24 NOTE — Progress Notes (Signed)
Patient discharged per MD order. Report called to Art therapistAmber RN at facility. EMS called for transport.

## 2017-02-01 LAB — CULTURE, BLOOD (ROUTINE X 2)
CULTURE: NO GROWTH
CULTURE: NO GROWTH
SPECIAL REQUESTS: ADEQUATE
Special Requests: ADEQUATE

## 2017-02-09 ENCOUNTER — Emergency Department: Payer: Medicare Other

## 2017-02-09 ENCOUNTER — Inpatient Hospital Stay
Admission: EM | Admit: 2017-02-09 | Discharge: 2017-02-13 | DRG: 871 | Disposition: A | Discharge status: Home / Self Care | Payer: Medicare Other | Attending: Internal Medicine | Admitting: Internal Medicine

## 2017-02-09 ENCOUNTER — Encounter: Payer: Self-pay | Admitting: Internal Medicine

## 2017-02-09 DIAGNOSIS — Z7952 Long term (current) use of systemic steroids: Secondary | ICD-10-CM

## 2017-02-09 DIAGNOSIS — J189 Pneumonia, unspecified organism: Secondary | ICD-10-CM | POA: Diagnosis present

## 2017-02-09 DIAGNOSIS — R61 Generalized hyperhidrosis: Secondary | ICD-10-CM | POA: Diagnosis present

## 2017-02-09 DIAGNOSIS — Z87820 Personal history of traumatic brain injury: Secondary | ICD-10-CM

## 2017-02-09 DIAGNOSIS — Z79899 Other long term (current) drug therapy: Secondary | ICD-10-CM | POA: Diagnosis not present

## 2017-02-09 DIAGNOSIS — R7989 Other specified abnormal findings of blood chemistry: Secondary | ICD-10-CM

## 2017-02-09 DIAGNOSIS — K219 Gastro-esophageal reflux disease without esophagitis: Secondary | ICD-10-CM | POA: Diagnosis present

## 2017-02-09 DIAGNOSIS — F17210 Nicotine dependence, cigarettes, uncomplicated: Secondary | ICD-10-CM | POA: Diagnosis present

## 2017-02-09 DIAGNOSIS — I10 Essential (primary) hypertension: Secondary | ICD-10-CM | POA: Diagnosis present

## 2017-02-09 DIAGNOSIS — R740 Nonspecific elevation of levels of transaminase and lactic acid dehydrogenase [LDH]: Secondary | ICD-10-CM | POA: Diagnosis present

## 2017-02-09 DIAGNOSIS — Y95 Nosocomial condition: Secondary | ICD-10-CM | POA: Diagnosis present

## 2017-02-09 DIAGNOSIS — Z881 Allergy status to other antibiotic agents status: Secondary | ICD-10-CM | POA: Diagnosis not present

## 2017-02-09 DIAGNOSIS — Z888 Allergy status to other drugs, medicaments and biological substances status: Secondary | ICD-10-CM

## 2017-02-09 DIAGNOSIS — Z9119 Patient's noncompliance with other medical treatment and regimen: Secondary | ICD-10-CM

## 2017-02-09 DIAGNOSIS — D72829 Elevated white blood cell count, unspecified: Secondary | ICD-10-CM

## 2017-02-09 DIAGNOSIS — E876 Hypokalemia: Secondary | ICD-10-CM | POA: Diagnosis present

## 2017-02-09 DIAGNOSIS — A419 Sepsis, unspecified organism: Secondary | ICD-10-CM | POA: Diagnosis not present

## 2017-02-09 DIAGNOSIS — Z5320 Procedure and treatment not carried out because of patient's decision for unspecified reasons: Secondary | ICD-10-CM | POA: Diagnosis present

## 2017-02-09 HISTORY — DX: Bacteremia: R78.81

## 2017-02-09 LAB — COMPREHENSIVE METABOLIC PANEL
ALBUMIN: 4.3 g/dL (ref 3.5–5.0)
ALT: 41 U/L (ref 17–63)
AST: 44 U/L — AB (ref 15–41)
Alkaline Phosphatase: 85 U/L (ref 38–126)
Anion gap: 11 (ref 5–15)
BUN: 15 mg/dL (ref 6–20)
CHLORIDE: 105 mmol/L (ref 101–111)
CO2: 23 mmol/L (ref 22–32)
CREATININE: 1.04 mg/dL (ref 0.61–1.24)
Calcium: 9.2 mg/dL (ref 8.9–10.3)
GFR calc Af Amer: >60 mL/min (ref >60)
GFR calc non Af Amer: >60 mL/min (ref >60)
GLUCOSE: 147 mg/dL — AB (ref 65–99)
Potassium: 2.7 mmol/L — CL (ref 3.5–5.1)
SODIUM: 139 mmol/L (ref 135–145)
Total Bilirubin: 1.2 mg/dL (ref 0.3–1.2)
Total Protein: 7.7 g/dL (ref 6.5–8.1)

## 2017-02-09 LAB — CBC WITH DIFFERENTIAL/PLATELET
Basophils Absolute: 0.1 10*3/uL (ref 0–0.1)
Basophils Relative: 0 %
Eosinophils Absolute: 0 10*3/uL (ref 0–0.7)
Eosinophils Relative: 0 %
HEMATOCRIT: 43.2 % (ref 40.0–52.0)
HEMOGLOBIN: 14.8 g/dL (ref 13.0–18.0)
LYMPHS PCT: 9 %
Lymphs Abs: 1.2 10*3/uL (ref 1.0–3.6)
MCH: 31.8 pg (ref 26.0–34.0)
MCHC: 34.2 g/dL (ref 32.0–36.0)
MCV: 93.1 fL (ref 80.0–100.0)
MONO ABS: 0.8 10*3/uL (ref 0.2–1.0)
MONOS PCT: 6 %
NEUTROS ABS: 11.6 10*3/uL — AB (ref 1.4–6.5)
Neutrophils Relative %: 85 %
Platelets: 287 10*3/uL (ref 150–440)
RBC: 4.63 MIL/uL (ref 4.40–5.90)
RDW: 14 % (ref 11.5–14.5)
WBC: 13.7 10*3/uL — ABNORMAL HIGH (ref 3.8–10.6)

## 2017-02-09 LAB — URINALYSIS, COMPLETE (UACMP) WITH MICROSCOPIC
BILIRUBIN URINE: NEGATIVE
Bacteria, UA: NONE SEEN
GLUCOSE, UA: NEGATIVE mg/dL
Hgb urine dipstick: NEGATIVE
KETONES UR: NEGATIVE mg/dL
LEUKOCYTES UA: NEGATIVE
Nitrite: NEGATIVE
PROTEIN: 100 mg/dL — AB
SQUAMOUS EPITHELIAL / LPF: NONE SEEN
Specific Gravity, Urine: 1.017 (ref 1.005–1.030)
pH: 6 (ref 5.0–8.0)

## 2017-02-09 LAB — LACTIC ACID, PLASMA
Lactic Acid, Venous: 2.7 mmol/L (ref 0.5–1.9)
Lactic Acid, Venous: 2.2 mmol/L (ref 0.5–1.9)

## 2017-02-09 LAB — PROTIME-INR
INR: 1
Prothrombin Time: 13.2 seconds (ref 11.4–15.2)

## 2017-02-09 MED ORDER — VANCOMYCIN HCL 10 G IV SOLR
1250.0000 mg | Freq: Two times a day (BID) | INTRAVENOUS | Status: DC
  Administered 2017-02-10: 1250 mg via INTRAVENOUS
  Filled 2017-02-09 (×2): qty 1250

## 2017-02-09 MED ORDER — GABAPENTIN 300 MG PO CAPS
300.0000 mg | ORAL_CAPSULE | Freq: Two times a day (BID) | ORAL | Status: DC
  Administered 2017-02-09 – 2017-02-13 (×8): 300 mg via ORAL
  Filled 2017-02-09 (×8): qty 1

## 2017-02-09 MED ORDER — ALLOPURINOL 100 MG PO TABS
100.0000 mg | ORAL_TABLET | Freq: Every day | ORAL | Status: DC
  Administered 2017-02-10 – 2017-02-13 (×4): 100 mg via ORAL
  Filled 2017-02-09 (×4): qty 1

## 2017-02-09 MED ORDER — PANTOPRAZOLE SODIUM 40 MG PO TBEC
40.0000 mg | DELAYED_RELEASE_TABLET | Freq: Every day | ORAL | Status: DC
  Administered 2017-02-10 – 2017-02-13 (×4): 40 mg via ORAL
  Filled 2017-02-09 (×4): qty 1

## 2017-02-09 MED ORDER — DEXTROSE 5 % IV SOLN
2.0000 g | Freq: Three times a day (TID) | INTRAVENOUS | Status: DC
  Administered 2017-02-10: 2 g via INTRAVENOUS
  Filled 2017-02-09 (×6): qty 2

## 2017-02-09 MED ORDER — CALCIUM CARBONATE ANTACID 500 MG PO CHEW: 1.0000 | CHEWABLE_TABLET | Freq: Two times a day (BID) | ORAL | Status: DC | PRN

## 2017-02-09 MED ORDER — VANCOMYCIN HCL 10 G IV SOLR
1250.0000 mg | Freq: Two times a day (BID) | INTRAVENOUS | Status: DC
  Filled 2017-02-09: qty 1250

## 2017-02-09 MED ORDER — DOCUSATE SODIUM 100 MG PO CAPS: 100.0000 mg | ORAL_CAPSULE | Freq: Two times a day (BID) | ORAL | Status: DC | PRN

## 2017-02-09 MED ORDER — VANCOMYCIN HCL IN DEXTROSE 1-5 GM/200ML-% IV SOLN
1000.0000 mg | Freq: Once | INTRAVENOUS | Status: AC
  Administered 2017-02-09: 1000 mg via INTRAVENOUS
  Filled 2017-02-09: qty 200

## 2017-02-09 MED ORDER — MAGNESIUM OXIDE 400 (241.3 MG) MG PO TABS
400.0000 mg | ORAL_TABLET | Freq: Two times a day (BID) | ORAL | Status: DC
  Administered 2017-02-09 – 2017-02-13 (×8): 400 mg via ORAL
  Filled 2017-02-09 (×8): qty 1

## 2017-02-09 MED ORDER — SODIUM CHLORIDE 0.9 % IV SOLN
1500.0000 mg | INTRAVENOUS | Status: AC
  Administered 2017-02-09: 1500 mg via INTRAVENOUS
  Filled 2017-02-09 (×2): qty 1500

## 2017-02-09 MED ORDER — HEPARIN SODIUM (PORCINE) 5000 UNIT/ML IJ SOLN
5000.0000 [IU] | Freq: Three times a day (TID) | INTRAMUSCULAR | Status: DC
  Administered 2017-02-09 – 2017-02-13 (×10): 5000 [IU] via SUBCUTANEOUS
  Filled 2017-02-09 (×9): qty 1

## 2017-02-09 MED ORDER — CALCIUM CARBONATE ANTACID 750 MG PO CHEW: 1.0000 | CHEWABLE_TABLET | Freq: Two times a day (BID) | ORAL | Status: DC | PRN

## 2017-02-09 MED ORDER — DIVALPROEX SODIUM 125 MG PO CSDR
125.0000 mg | DELAYED_RELEASE_CAPSULE | Freq: Two times a day (BID) | ORAL | Status: DC
  Administered 2017-02-09 – 2017-02-13 (×8): 125 mg via ORAL
  Filled 2017-02-09 (×9): qty 1

## 2017-02-09 MED ORDER — METOCLOPRAMIDE HCL 5 MG PO TABS: 5.0000 mg | ORAL_TABLET | Freq: Three times a day (TID) | ORAL | Status: DC | PRN

## 2017-02-09 MED ORDER — LEVOFLOXACIN IN D5W 750 MG/150ML IV SOLN
750.0000 mg | Freq: Once | INTRAVENOUS | Status: AC
  Administered 2017-02-09: 750 mg via INTRAVENOUS
  Filled 2017-02-09: qty 150

## 2017-02-09 MED ORDER — SERTRALINE HCL 50 MG PO TABS
50.0000 mg | ORAL_TABLET | Freq: Every morning | ORAL | Status: DC
  Administered 2017-02-10 – 2017-02-13 (×4): 50 mg via ORAL
  Filled 2017-02-09 (×4): qty 1

## 2017-02-09 MED ORDER — IPRATROPIUM-ALBUTEROL 0.5-2.5 (3) MG/3ML IN SOLN
3.0000 mL | Freq: Three times a day (TID) | RESPIRATORY_TRACT | Status: DC
  Administered 2017-02-09 – 2017-02-11 (×5): 3 mL via RESPIRATORY_TRACT
  Filled 2017-02-09 (×5): qty 3

## 2017-02-09 MED ORDER — DEXTROSE 5 % IV SOLN
1.0000 g | Freq: Three times a day (TID) | INTRAVENOUS | Status: DC
  Filled 2017-02-09 (×4): qty 1

## 2017-02-09 MED ORDER — ACETAMINOPHEN 325 MG PO TABS: 650.0000 mg | ORAL_TABLET | Freq: Four times a day (QID) | ORAL | Status: DC | PRN

## 2017-02-09 NOTE — ED Provider Notes (Signed)
[facility] Emergency Department Provider Note   ____________________________________________   I have reviewed the triage vital signs and the nursing notes.   HISTORY  Chief Complaint Fall and Excessive Sweating   History limited by: Not Limited   HPI Jacob Villanueva is a 66 y.o. male who presents to the emergency department today because of concerns for feeling ill, weakness and sweating. The patient recently had an admission to the hospital for sepsis secondary to pneumonia. He was at rehabilitation after this and came home 2 days ago. He states that since being home he has got worse again. He is currently feeling sicker. His family member checked on him and noticed that he was much weaker. Additionally the patient was found to be quite diaphoretic. The patient denies any significant shortness of breath, no problems with urination. He has had some abdominal pain which he attributes to his acid reflux.   Past Medical History:  Diagnosis Date  . Hypertension   . TBI (traumatic brain injury) [facility](HCC)     Patient Active Problem List   Diagnosis Date Noted  . Acute encephalopathy 01/19/2017    Past Surgical History:  Procedure Laterality Date  . APPENDECTOMY    . FRACTURE SURGERY      Prior to Admission medications   Medication Sig Start Date End Date Taking? Authorizing Provider  allopurinol (ZYLOPRIM) 100 MG tablet Take 100 mg by mouth daily. 01/12/17   [provider]  amLODipine (NORVASC) 5 MG tablet Take 1 tablet (5 mg total) by mouth daily. 01/24/17   Alford HighlandWieting, Richard, MD  calcium carbonate (TUMS EX) 750 MG chewable tablet Chew 1 tablet by mouth 2 (two) times daily.    [provider]  cephALEXin (KEFLEX) 500 MG capsule Take 1 capsule (500 mg total) by mouth every 8 (eight) hours. 01/24/17   Alford HighlandWieting, Richard, MD  esomeprazole (NEXIUM) 40 MG capsule Take 40 mg by mouth 2 (two) times daily before a meal.     [provider]   gabapentin (NEURONTIN) 300 MG capsule Take 1 capsule (300 mg total) by mouth 2 (two) times daily. 01/24/17   Alford HighlandWieting, Richard, MD  ipratropium-albuterol (DUONEB) 0.5-2.5 (3) MG/3ML SOLN Take 3 mLs by nebulization 3 (three) times daily. 01/24/17   Alford HighlandWieting, Richard, MD  magnesium oxide (MAG-OX) 400 (241.3 Mg) MG tablet Take 1 tablet (400 mg total) by mouth 2 (two) times daily. 01/24/17   Alford HighlandWieting, Richard, MD  metroNIDAZOLE (FLAGYL) 500 MG tablet Take 1 tablet (500 mg total) by mouth every 8 (eight) hours. 01/24/17   Alford HighlandWieting, Richard, MD  nicotine (NICODERM CQ - DOSED IN MG/24 HOURS) 21 mg/24hr patch Place 1 patch (21 mg total) onto the skin daily. 01/24/17 01/24/18  Alford HighlandWieting, Richard, MD  NON FORMULARY 1 Dose every 3 (three) months. Testa Pill. A testosterone pill that is implanted into side q 3 months    [provider]  predniSONE (DELTASONE) 10 MG tablet 4 tabs daily for 2 more days then stop 01/24/17   Alford HighlandWieting, Richard, MD  sertraline (ZOLOFT) 50 MG tablet Take 50 mg by mouth every morning. 01/12/17   [provider]    Allergies Clindamycin/lincomycin; Effexor [venlafaxine hcl]; and Amoxicillin  No family history on file.  Social History Social History  Substance Use Topics  . Smoking status: Current Every Day Smoker    Packs/day: 2.00  . Smokeless tobacco: Never Used  . Alcohol use Yes    Review of Systems Constitutional: Positive for diaphoresis. Eyes: No visual  changes. ENT: No sore throat. Cardiovascular: Denies chest pain. Respiratory: Denies shortness of breath. Gastrointestinal: No abdominal pain.  No nausea, no vomiting.  No diarrhea.   Genitourinary: Negative for dysuria. Musculoskeletal: Negative for back pain. Skin: Negative for rash. Neurological: Negative for headaches, focal weakness or numbness.  ____________________________________________   PHYSICAL EXAM:  VITAL SIGNS: ED Triage Vitals  Enc Vitals Group     BP 02/09/17 1314 102/72     Pulse  Rate 02/09/17 1314 (!) 117     Resp 02/09/17 1314 (!) 23     Temp 02/09/17 1314 98.9 F (37.2 C)     Temp Source 02/09/17 1314 Oral     SpO2 02/09/17 1314 97 %     Weight 02/09/17 1319 195 lb (88.5 kg)     Height 02/09/17 1319 6\' 4"  (1.93 m)     Head Circumference --      Peak Flow --      Pain Score 02/09/17 1313 0    Constitutional: Awake, alert and oriented. Slightly clammy. Eyes: Conjunctivae are normal.  ENT   Head: Normocephalic and atraumatic.   Nose: No congestion/rhinnorhea.   Mouth/Throat: Mucous membranes are moist.   Neck: No stridor. Hematological/Lymphatic/Immunilogical: No cervical lymphadenopathy. Cardiovascular: Tachycardic, regular rhythm.  No murmurs, rubs, or gallops.  Respiratory: Normal respiratory effort without tachypnea nor retractions. Breath sounds are clear and equal bilaterally. No wheezes/rales/rhonchi. Gastrointestinal: Soft and non tender. No rebound. No guarding.  Genitourinary: Deferred Musculoskeletal: Normal range of motion in all extremities. No lower extremity edema. Neurologic:  Normal speech and language. No gross focal neurologic deficits are appreciated.  Skin:  Skin is warm, dry and intact. No rash noted. Psychiatric: Mood and affect are normal. Speech and behavior are normal. Patient exhibits appropriate insight and judgment.  ____________________________________________    LABS (pertinent positives/negatives)  Lactic 2.7 WBC 13.7 Potassium 2.7  ____________________________________________   EKG  I, Phineas Semen, attending physician, personally viewed and interpreted this EKG  EKG Time: 1319 Rate: 115 Rhythm: sinus tachycardia Axis: left axis deviation Intervals: qtc 478 QRS: LAFB, q waves V1, V2 ST changes: no st elevation Impression: abnormal ekg   ____________________________________________    RADIOLOGY  CXR IMPRESSION:  Bibasilar atelectasis.    ____________________________________________   PROCEDURES  Procedures  CRITICAL CARE Performed by: Phineas Semen   Total critical care time: 35 minutes  Critical care time was exclusive of separately billable procedures and treating other patients.  Critical care was necessary to treat or prevent imminent or life-threatening deterioration.  Critical care was time spent personally by me on the following activities: development of treatment plan with patient and/or surrogate as well as nursing, discussions with consultants, evaluation of patient's response to treatment, examination of patient, obtaining history from patient or surrogate, ordering and performing treatments and interventions, ordering and review of laboratory studies, ordering and review of radiographic studies, pulse oximetry and re-evaluation of patient's condition.  ____________________________________________   INITIAL IMPRESSION / ASSESSMENT AND PLAN / ED COURSE  Pertinent labs & imaging results that were available during my care of the patient were reviewed by me and considered in my medical decision making (see chart for details).  Patient presented to the emergency department today from home because of feeling unwell and significant weakness. Patients initial vital signs concerning for sepsis. He was tachycardic and tachypneic. Additionally he was quite clammy. Patient was ordered for multiple broad-spectrum IV antibiotics and IV fluids. The patient's white blood cell count and lactic acid both returned elevated.  This further confirms suspicions for infection. However unclear etiology. Patient chest x-ray without obvious findings however think an underlying pneumonia still possible. Patient will be admitted to the hospital service.  ____________________________________________   FINAL CLINICAL IMPRESSION(S) / ED DIAGNOSES  Final diagnoses:  Elevated lactic acid level  Hypokalemia  Diaphoresis   Leukocytosis, unspecified type     Note: This dictation was prepared with Dragon dictation. Any transcriptional errors that result from this process are unintentional     Phineas Semen, MD 02/10/17 1231

## 2017-02-09 NOTE — ED Notes (Signed)
Date and time results received: 02/09/17 2:12 PM  Test: Potassium Critical Value: 2.7  Name of Provider Notified: Dr. Derrill KayGoodman  Orders Received? Or Actions Taken?: EDP acknowledged.
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
## 2017-02-09 NOTE — Plan of Care (Signed)
Problem: Safety: Goal: Ability to remain free from injury will improve Outcome: Progressing Bed alarm on   

## 2017-02-09 NOTE — ED Notes (Signed)
Date and time results received: 02/09/17 2:19 PM  Test: Lactic Critical Value: 2.7  Name of Provider Notified: Dr. Derrill KayGoodman  Orders Received? Or Actions Taken?: acknowledged

## 2017-02-09 NOTE — Progress Notes (Signed)
Family Meeting Note  Advance Directive:yes  Today a meeting took place with the Patient and his sister ( POA) and neice.  The following clinical team members were present during this meeting:MD  The following were discussed:Patient's diagnosis: TBI, Sepsis, Patient's progosis: Unable to determine and Goals for treatment: Full Code  Additional follow-up to be provided: Abx and cultures.  Time spent during discussion:20 minutes  Weda Baumgarner, Heath GoldVAIBHAVKUMAR, MD

## 2017-02-09 NOTE — Progress Notes (Signed)
Pharmacy Antibiotic Note  Jacob Villanueva is a 66 y.o. male admitted on 02/09/2017 with sepsis.  Pharmacy has been consulted for Vancomycin and Cefepime  dosing.  Plan: Patient received Vancomycin 1 g IV x 1 in ED prior to pharmacist consult. Will give an additional 1500 mg of Vancomycin to = 2500 mg dose. Will then start Vancomycin 1250 mg IV q12 hours. Will check trough level prior to 0500 dose on 7/1    Cefepime: Cefepime 2 g IV q8 hours.    Height: 6\' 4"  (193 cm) Weight: 195 lb (88.5 kg) IBW/kg (Calculated) : 86.8  Temp (24hrs), Avg:98.9 F (37.2 C), Min:98.9 F (37.2 C), Max:98.9 F (37.2 C)   Recent Labs Lab 02/09/17 1321  WBC 13.7*  CREATININE 1.04  LATICACIDVEN 2.7*    Estimated Creatinine Clearance: 83.5 mL/min (by C-G formula based on SCr of 1.04 mg/dL).    Allergies  Allergen Reactions  . Clindamycin/Lincomycin Diarrhea  . Effexor [Venlafaxine Hcl]     DELUSIONAL   . Amoxicillin Rash    Antimicrobials this admission: Vancomycin  6/28 >>  Cefepime  6/28 >>   Dose adjustments this admission:   Microbiology results:  BCx:   UCx:    Sputum:    MRSA PCR:   Thank you for allowing pharmacy to be a part of this patient's care.  Katrin Grabel D 02/09/2017 4:30 PM

## 2017-02-09 NOTE — H&P (Signed)
[employer] [facility] [facility] [place] [facility]   PATIENT NAME: Micha Dosanjh    MR#:  [id number]  DATE OF BIRTH:  1951-04-29  DATE OF ADMISSION:  02/09/2017  PRIMARY CARE PHYSICIAN: Patient, No Pcp Per   REQUESTING/REFERRING PHYSICIAN: Derrill Kay  CHIEF COMPLAINT:   Chief Complaint  Patient presents with  . Fall  . Excessive Sweating    HISTORY OF PRESENT ILLNESS: Aristide Waggle  is a 66 y.o. male with a known history of Recent admission in hospital with bacteremia and pneumonia, have traumatic brain injury in past and was sent to a rehabilitation Center after recent admission. Where he improved in the last 2 weeks and finished his antibiotic course orally and sent home 2 days ago. He lives in an independent living facility, he also started wearing diapers since last hospital admission but had only 1-2 accidents in all day when he was getting better and at rehab. Yesterday he did not eat much at his rehabilitation Center and today she did not answer the phone so his niece went in to check and he was on the floor feeling very weak she helped him to get up and get seated in the chair. She was noted he is having excessive sweating and he was soaked with his sweat. He also had accidents in his diper and he was soaked with the urine. Concerned with this she brought him to the emergency room today. He is noted to have elevated blood pressure count, tachycardia and borderline hypotension. And because of his history of recent sepsis and bacteremia ER physician gave broad-spectrum antibiotic and give it to hospitalist team. Patient and his niece and sister who were in the room denies any complaints of diarrhea or cough. Chest x-ray in ER shows some presence of pneumonia. Urine sample is still not corrected in ER.  PAST MEDICAL HISTORY:   Past Medical History:  Diagnosis Date  . Bacteremia 01/2017  . Hypertension   . TBI (traumatic brain injury) (HCC)     PAST SURGICAL HISTORY: Past Surgical History:   Procedure Laterality Date  . APPENDECTOMY    . FRACTURE SURGERY      SOCIAL HISTORY:  Social History  Substance Use Topics  . Smoking status: Current Every Day Smoker    Packs/day: 2.00  . Smokeless tobacco: Never Used  . Alcohol use Yes    FAMILY HISTORY: No family history on file.  DRUG ALLERGIES:  Allergies  Allergen Reactions  . Clindamycin/Lincomycin Diarrhea  . Effexor [Venlafaxine Hcl]     DELUSIONAL   . Amoxicillin Rash    REVIEW OF SYSTEMS:   CONSTITUTIONAL: No fever, Positive for fatigue or weakness.  EYES: No blurred or double vision.  EARS, NOSE, AND THROAT: No tinnitus or ear pain.  RESPIRATORY: No cough, shortness of breath, wheezing or hemoptysis.  CARDIOVASCULAR: No chest pain, orthopnea, edema.  GASTROINTESTINAL: No nausea, vomiting, diarrhea or abdominal pain.  GENITOURINARY: No dysuria, hematuria.  ENDOCRINE: No polyuria, nocturia,  HEMATOLOGY: No anemia, easy bruising or bleeding SKIN: No rash or lesion. MUSCULOSKELETAL: No joint pain or arthritis.   NEUROLOGIC: No tingling, numbness, weakness.  PSYCHIATRY: No anxiety or depression.   MEDICATIONS AT HOME:  Prior to Admission medications   Medication Sig Start Date End Date Taking? Authorizing Provider  allopurinol (ZYLOPRIM) 100 MG tablet Take 100 mg by mouth daily. 01/12/17  Yes [provider]  amLODipine (NORVASC) 5 MG tablet Take 1 tablet (5 mg total) by mouth daily. 01/24/17  Yes Wieting, Richard,  MD  calcium carbonate (TUMS EX) 750 MG chewable tablet Chew 1 tablet by mouth as needed.    Yes [provider]  divalproex (DEPAKOTE SPRINKLE) 125 MG capsule Take 125 mg by mouth 2 (two) times daily as needed. 02/07/17  Yes [provider]  esomeprazole (NEXIUM) 40 MG capsule Take 40 mg by mouth 2 (two) times daily before a meal.    Yes [provider]  gabapentin (NEURONTIN) 300 MG capsule Take 1 capsule (300 mg total) by mouth 2 (two) times daily. 01/24/17  Yes  Wieting, Richard, MD  ipratropium-albuterol (DUONEB) 0.5-2.5 (3) MG/3ML SOLN Take 3 mLs by nebulization 3 (three) times daily. 01/24/17  Yes Wieting, Richard, MD  magnesium oxide (MAG-OX) 400 (241.3 Mg) MG tablet Take 1 tablet (400 mg total) by mouth 2 (two) times daily. 01/24/17  Yes Wieting, Richard, MD  metoCLOPramide (REGLAN) 5 MG tablet Take 5 mg by mouth 3 (three) times daily with meals as needed. 01/13/17  Yes [provider]  NON FORMULARY 1 Dose every 3 (three) months. Testa Pill. A testosterone pill that is implanted into side q 3 months   Yes [provider]  ranitidine (ZANTAC) 150 MG tablet Take 150 mg by mouth at bedtime.   Yes [provider]  sertraline (ZOLOFT) 50 MG tablet Take 50 mg by mouth every morning. 01/12/17  Yes [provider]  nicotine (NICODERM CQ - DOSED IN MG/24 HOURS) 21 mg/24hr patch Place 1 patch (21 mg total) onto the skin daily. Patient not taking: Reported on 02/09/2017 01/24/17 01/24/18  Alford Highland, MD      PHYSICAL EXAMINATION:   VITAL SIGNS: Blood pressure 96/69, pulse (!) 115, temperature 98.9 F (37.2 C), temperature source Oral, resp. rate (!) 23, height 6\' 4"  (1.93 m), weight 88.5 kg (195 lb), SpO2 95 %.  GENERAL:  66 y.o.-year-old patient lying in the bed with no acute distress.  EYES: Pupils equal, round, reactive to light and accommodation. No scleral icterus. Extraocular muscles intact.  HEENT: Head atraumatic, normocephalic. Oropharynx and nasopharynx clear.  NECK:  Supple, no jugular venous distention. No thyroid enlargement, no tenderness.  LUNGS: Normal breath sounds bilaterally, no wheezing, rales,rhonchi or crepitation. No use of accessory muscles of respiration.  CARDIOVASCULAR: S1, S2 normal. No murmurs, rubs, or gallops.  ABDOMEN: Soft, nontender, nondistended. Bowel sounds present. No organomegaly or mass.  EXTREMITIES: No pedal edema, cyanosis, or clubbing.  NEUROLOGIC: Cranial nerves II through XII  are intact. Muscle strength 5/5 in all extremities. Sensation intact. Gait not checked.  PSYCHIATRIC: The patient is alert and oriented x 2.  SKIN: No obvious rash, lesion, or ulcer.   LABORATORY PANEL:   CBC  Recent Labs Lab 02/09/17 1321  WBC 13.7*  HGB 14.8  HCT 43.2  PLT 287  MCV 93.1  MCH 31.8  MCHC 34.2  RDW 14.0  LYMPHSABS 1.2  MONOABS 0.8  EOSABS 0.0  BASOSABS 0.1   ------------------------------------------------------------------------------------------------------------------  Chemistries   Recent Labs Lab 02/09/17 1321  NA 139  K 2.7*  CL 105  CO2 23  GLUCOSE 147*  BUN 15  CREATININE 1.04  CALCIUM 9.2  AST 44*  ALT 41  ALKPHOS 85  BILITOT 1.2   ------------------------------------------------------------------------------------------------------------------ estimated creatinine clearance is 83.5 mL/min (by C-G formula based on SCr of 1.04 mg/dL). ------------------------------------------------------------------------------------------------------------------ No results for input(s): TSH, T4TOTAL, T3FREE, THYROIDAB in the last 72 hours.  Invalid input(s): FREET3   Coagulation profile  Recent Labs Lab 02/09/17 1321  INR 1.00   -------------------------------------------------------------------------------------------------------------------  No results for input(s): DDIMER in the last 72 hours. -------------------------------------------------------------------------------------------------------------------  Cardiac Enzymes No results for input(s): CKMB, TROPONINI, MYOGLOBIN in the last 168 hours.  Invalid input(s): CK ------------------------------------------------------------------------------------------------------------------ Invalid input(s): POCBNP  ---------------------------------------------------------------------------------------------------------------  Urinalysis    Component Value Date/Time   COLORURINE AMBER (A)  01/19/2017 1336   APPEARANCEUR HAZY (A) 01/19/2017 1336   LABSPEC 1.030 01/19/2017 1336   PHURINE 5.0 01/19/2017 1336   GLUCOSEU NEGATIVE 01/19/2017 1336   HGBUR MODERATE (A) 01/19/2017 1336   BILIRUBINUR SMALL (A) 01/19/2017 1336   KETONESUR 5 (A) 01/19/2017 1336   PROTEINUR 100 (A) 01/19/2017 1336   UROBILINOGEN 0.2 01/13/2013 1629   NITRITE NEGATIVE 01/19/2017 1336   LEUKOCYTESUR NEGATIVE 01/19/2017 1336     RADIOLOGY: Dg Chest 2 View  Result Date: 02/09/2017 CLINICAL DATA:  Past history of traumatic brain injury, found on floor at home question fall, diaphoretic, recent pneumonia, history hypertension EXAM: CHEST  2 VIEW COMPARISON:  01/19/2017 FINDINGS: Borderline enlargement of cardiac silhouette. Mediastinal contours and pulmonary vascularity normal. Decreased lung volumes with bibasilar atelectasis. Lungs otherwise clear. No pleural effusion or pneumothorax. Bones demineralized. IMPRESSION: Bibasilar atelectasis. Electronically Signed   By: Ulyses SouthwardMark  Boles M.D.   On: 02/09/2017 14:00    EKG: Orders placed or performed during the hospital encounter of 02/09/17  . EKG 12-Lead  . EKG 12-Lead    IMPRESSION AND PLAN:  * Sepsis   Likely secondary to healthcare associated pneumonia or UTI.   Urine sample is still not corrected, if not in few hours then I advised to the nurses to do in and out catheterization.   IV vancomycin and cefepime for now, blood cultures are sent.   Follow urine culture also.  * Generalized weakness   Get physical therapy evaluation again.  * Hypertension   We'll hold antihypertensive medications currently because of sepsis.  * Traumatic brain injury    Continue Depakote.  * Active smoking   Counseled to quit smoking for 4 minutes and offered nicotine patch.  All the records are reviewed and case discussed with ED provider. Management plans discussed with the patient, family and they are in agreement.  CODE STATUS: Full code. Code Status History     Date Active Date Inactive Code Status Order ID Comments User Context   01/19/2017  6:02 PM 01/24/2017  7:34 PM Full Code [id number]  Ramonita LabGouru, Aruna, MD Inpatient    Advance Directive Documentation     Most Recent Value  Type of Advance Directive  Healthcare Power of Attorney  Pre-existing out of facility DNR order (yellow form or pink MOST form)  -  "MOST" Form in Place?  -     Plan discussed with patient's sister who is power of attorney and niece who is also involved in his care.  TOTAL TIME TAKING CARE OF THIS PATIENT: 50 minutes.    Altamese DillingVACHHANI, Mayson Mcneish M.D on 02/09/2017   Between 7am to 6pm - Pager - [phone]  After 6pm go to [email or web address] - password [email or web address]  [facility]  Office  [phone]  CC: Primary care physician; Patient, No Pcp Per   Note: This dictation was prepared with Dragon dictation along with smaller phrase technology. Any transcriptional errors that result from this process are unintentional.

## 2017-02-09 NOTE — ED Triage Notes (Signed)
[facility]  Pt has hx of TBI and niece is MPOA.  Pt was found on floor and appears that he may have fallen.  Pt was reported to be covered in sweat and still appears diaphoretic upon arrival with EMS.  EMS started 2 18 gauge lines in route and gave approx 700cc of NS.  Pt had BP of 80s/50s with EMS and HR in the 120s.  Pt unable to answer many questions due to hx of TBI.  Niece at bedside now.  Pt was recently hospitalized for pneumonia, per EMS.  Pt placed on 3LNC by EMS, unknown if pt uses O2 at home or not.

## 2017-02-10 LAB — BLOOD CULTURE ID PANEL (REFLEXED)
ACINETOBACTER BAUMANNII: NOT DETECTED
CANDIDA ALBICANS: NOT DETECTED
CANDIDA GLABRATA: NOT DETECTED
CANDIDA KRUSEI: NOT DETECTED
CANDIDA PARAPSILOSIS: NOT DETECTED
CANDIDA TROPICALIS: NOT DETECTED
ENTEROBACTER CLOACAE COMPLEX: NOT DETECTED
ENTEROBACTERIACEAE SPECIES: NOT DETECTED
ESCHERICHIA COLI: NOT DETECTED
Enterococcus species: NOT DETECTED
Haemophilus influenzae: NOT DETECTED
KLEBSIELLA OXYTOCA: NOT DETECTED
KLEBSIELLA PNEUMONIAE: NOT DETECTED
Listeria monocytogenes: NOT DETECTED
METHICILLIN RESISTANCE: NOT DETECTED
Neisseria meningitidis: NOT DETECTED
PSEUDOMONAS AERUGINOSA: NOT DETECTED
Proteus species: NOT DETECTED
STREPTOCOCCUS PNEUMONIAE: NOT DETECTED
STREPTOCOCCUS PYOGENES: NOT DETECTED
Serratia marcescens: NOT DETECTED
Staphylococcus aureus (BCID): NOT DETECTED
Staphylococcus species: DETECTED — AB
Streptococcus agalactiae: NOT DETECTED
Streptococcus species: NOT DETECTED

## 2017-02-10 LAB — CBC
HEMATOCRIT: 35.9 % — AB (ref 40.0–52.0)
HEMOGLOBIN: 12.4 g/dL — AB (ref 13.0–18.0)
MCH: 32 pg (ref 26.0–34.0)
MCHC: 34.4 g/dL (ref 32.0–36.0)
MCV: 92.9 fL (ref 80.0–100.0)
Platelets: 221 10*3/uL (ref 150–440)
RBC: 3.86 MIL/uL — AB (ref 4.40–5.90)
RDW: 13.9 % (ref 11.5–14.5)
WBC: 7.8 10*3/uL (ref 3.8–10.6)

## 2017-02-10 LAB — BASIC METABOLIC PANEL
ANION GAP: 4 — AB (ref 5–15)
BUN: 11 mg/dL (ref 6–20)
CHLORIDE: 107 mmol/L (ref 101–111)
CO2: 24 mmol/L (ref 22–32)
Calcium: 8 mg/dL — ABNORMAL LOW (ref 8.9–10.3)
Creatinine, Ser: 0.92 mg/dL (ref 0.61–1.24)
GFR calc Af Amer: >60 mL/min (ref >60)
Glucose, Bld: 103 mg/dL — ABNORMAL HIGH (ref 65–99)
POTASSIUM: 2.8 mmol/L — AB (ref 3.5–5.1)
SODIUM: 135 mmol/L (ref 135–145)

## 2017-02-10 LAB — PROCALCITONIN

## 2017-02-10 LAB — MAGNESIUM: MAGNESIUM: 1.7 mg/dL (ref 1.7–2.4)

## 2017-02-10 LAB — POTASSIUM: Potassium: 3.4 mmol/L — ABNORMAL LOW (ref 3.5–5.1)

## 2017-02-10 MED ORDER — POTASSIUM CHLORIDE CRYS ER 20 MEQ PO TBCR
40.0000 meq | EXTENDED_RELEASE_TABLET | Freq: Once | ORAL | Status: AC
  Administered 2017-02-10: 40 meq via ORAL
  Filled 2017-02-10: qty 2

## 2017-02-10 MED ORDER — POTASSIUM CHLORIDE 10 MEQ/100ML IV SOLN
10.0000 meq | INTRAVENOUS | Status: AC
  Administered 2017-02-10 (×4): 10 meq via INTRAVENOUS
  Filled 2017-02-10 (×2): qty 100

## 2017-02-10 MED ORDER — LEVOFLOXACIN IN D5W 750 MG/150ML IV SOLN
750.0000 mg | Freq: Once | INTRAVENOUS | Status: DC
  Filled 2017-02-10: qty 150

## 2017-02-10 MED ORDER — LEVOFLOXACIN 750 MG PO TABS
750.0000 mg | ORAL_TABLET | Freq: Every day | ORAL | Status: DC
  Administered 2017-02-10 – 2017-02-13 (×4): 750 mg via ORAL
  Filled 2017-02-10 (×4): qty 1

## 2017-02-10 MED ORDER — MAGNESIUM SULFATE IN D5W 1-5 GM/100ML-% IV SOLN
1.0000 g | Freq: Once | INTRAVENOUS | Status: AC
  Administered 2017-02-10: 1 g via INTRAVENOUS
  Filled 2017-02-10: qty 100

## 2017-02-10 NOTE — Progress Notes (Signed)
PT Cancellation Note  Patient Details Name: Jacob Villanueva MRN: [id number] DOB: 10/28/1950   Cancelled Treatment:    Reason Eval/Treat Not Completed: Other (comment).  Pt's potassium noted to be 2.8 today and no telemetry orders noted in chart.  Per PT guidelines for low potassium, will hold PT at this time and re-attempt PT eval at a later date/time.   [name], PT 02/10/17, 12:11 PM [phone]

## 2017-02-10 NOTE — Consult Note (Signed)
MEDICATION RELATED CONSULT NOTE - Follow up  Pharmacy Consult for electrolytes Indication: hypokalemia  Allergies  Allergen Reactions  . Clindamycin/Lincomycin Diarrhea  . Effexor [Venlafaxine Hcl]     DELUSIONAL   . Amoxicillin Rash    Patient Measurements: Height: 6\' 4"  (193 cm) Weight: 195 lb (88.5 kg) IBW/kg (Calculated) : 86.8 Adjusted Body Weight:   Vital Signs: Temp: 98.4 F (36.9 C) (06/29 1234) Temp Source: Oral (06/29 1234) BP: 130/82 (06/29 1234) Pulse Rate: 75 (06/29 1234) Intake/Output from previous day: 06/28 0701 - 06/29 0700 In: 1367 [P.O.:360; IV Piggyback:1007] Out: 250 [Urine:250] Intake/Output from this shift: No intake/output data recorded.  Labs:  Recent Labs  02/09/17 1321 02/10/17 0342  WBC 13.7* 7.8  HGB 14.8 12.4*  HCT 43.2 35.9*  PLT 287 221  CREATININE 1.04 0.92  MG  --  1.7  ALBUMIN 4.3  --   PROT 7.7  --   AST 44*  --   ALT 41  --   ALKPHOS 85  --   BILITOT 1.2  --    Estimated Creatinine Clearance: 94.3 mL/min (by C-G formula based on SCr of 0.92 mg/dL).   Assessment: Patient is a 66 year old male with a PMH of TBI, bacteremia readmitted with PNA/spesis found to be hypokalemic.  K 3.4 at 1820 Mag was 1.7  Goal of Therapy:  Normalization of electroyltes  Plan:  KCl 40 PO x1 Mag sulfate 1g IV x1 Recheck labs in AM  Pharmacy will continue to follow.   Crist FatHannah Nicklas Mcsweeney, PharmD, BCPS Clinical Pharmacist 02/10/2017 7:28 PM

## 2017-02-10 NOTE — Consult Note (Signed)
MEDICATION RELATED CONSULT NOTE - INITIAL   Pharmacy Consult for electrolytes Indication: hypokalemia  Allergies  Allergen Reactions  . Clindamycin/Lincomycin Diarrhea  . Effexor [Venlafaxine Hcl]     DELUSIONAL   . Amoxicillin Rash    Patient Measurements: Height: 6\' 4"  (193 cm) Weight: 195 lb (88.5 kg) IBW/kg (Calculated) : 86.8 Adjusted Body Weight:   Vital Signs: Temp: 98.6 F (37 C) (06/29 0328) Temp Source: Oral (06/29 0328) BP: 128/76 (06/29 0328) Pulse Rate: 67 (06/29 0328) Intake/Output from previous day: 06/28 0701 - 06/29 0700 In: 1367 [P.O.:360; IV Piggyback:1007] Out: 250 [Urine:250] Intake/Output from this shift: No intake/output data recorded.  Labs:  Recent Labs  02/09/17 1321 02/10/17 0342  WBC 13.7* 7.8  HGB 14.8 12.4*  HCT 43.2 35.9*  PLT 287 221  CREATININE 1.04 0.92  ALBUMIN 4.3  --   PROT 7.7  --   AST 44*  --   ALT 41  --   ALKPHOS 85  --   BILITOT 1.2  --    Estimated Creatinine Clearance: 94.3 mL/min (by C-G formula based on SCr of 0.92 mg/dL).   Microbiology: Recent Results (from the past 720 hour(s))  Culture, blood (Routine x 2)     Status: Abnormal   Collection Time: 01/19/17 12:46 PM  Result Value Ref Range Status   Specimen Description BLOOD R HAND  Final   Special Requests   Final    BOTTLES DRAWN AEROBIC AND ANAEROBIC Blood Culture results may not be optimal due to an excessive volume of blood received in culture bottles   Culture  Setup Time   Final    GRAM POSITIVE COCCI IN BOTH AEROBIC AND ANAEROBIC BOTTLES CRITICAL RESULT CALLED TO, READ BACK BY AND VERIFIED WITH: KAREN HAYES 01/20/17 0945 SGD    Culture (A)  Final    STAPHYLOCOCCUS HOMINIS SUSCEPTIBILITIES PERFORMED ON PREVIOUS CULTURE WITHIN THE LAST 5 DAYS. Performed at [facility], [address] [place], [place] [address]    Report Status 01/22/2017 FINAL  Final  Culture, blood (Routine x 2)     Status: Abnormal   Collection Time: 01/19/17 12:46  PM  Result Value Ref Range Status   Specimen Description BLOOD R ARM  Final   Special Requests   Final    BOTTLES DRAWN AEROBIC AND ANAEROBIC Blood Culture adequate volume   Culture  Setup Time   Final    GRAM POSITIVE COCCI AEROBIC BOTTLE ONLY CRITICAL VALUE NOTED.  VALUE IS CONSISTENT WITH PREVIOUSLY REPORTED AND CALLED VALUE.    Culture STAPHYLOCOCCUS HOMINIS (A)  Final   Report Status 01/22/2017 FINAL  Final   Organism ID, Bacteria STAPHYLOCOCCUS HOMINIS  Final      Susceptibility   Staphylococcus hominis - MIC*    CIPROFLOXACIN <=0.5 SENSITIVE Sensitive     ERYTHROMYCIN <=0.25 SENSITIVE Sensitive     GENTAMICIN <=0.5 SENSITIVE Sensitive     OXACILLIN <=0.25 SENSITIVE Sensitive     TETRACYCLINE <=1 SENSITIVE Sensitive     VANCOMYCIN <=0.5 SENSITIVE Sensitive     TRIMETH/SULFA <=10 SENSITIVE Sensitive     CLINDAMYCIN <=0.25 SENSITIVE Sensitive     RIFAMPIN <=0.5 SENSITIVE Sensitive     Inducible Clindamycin NEGATIVE Sensitive     * STAPHYLOCOCCUS HOMINIS  Blood Culture ID Panel (Reflexed)     Status: Abnormal   Collection Time: 01/19/17 12:46 PM  Result Value Ref Range Status   Enterococcus species NOT DETECTED NOT DETECTED Final   Listeria monocytogenes NOT DETECTED NOT DETECTED  Final   Staphylococcus species DETECTED (A) NOT DETECTED Final    Comment: Methicillin (oxacillin) susceptible coagulase negative staphylococcus. Possible blood culture contaminant (unless isolated from more than one blood culture draw or clinical case suggests pathogenicity). No antibiotic treatment is indicated for blood  culture contaminants. CRITICAL RESULT CALLED TO, READ BACK BY AND VERIFIED WITH: KAREN HAYES 01/20/17 0945 SGD    Staphylococcus aureus NOT DETECTED NOT DETECTED Final   Methicillin resistance NOT DETECTED NOT DETECTED Final   Streptococcus species NOT DETECTED NOT DETECTED Final   Streptococcus agalactiae NOT DETECTED NOT DETECTED Final   Streptococcus pneumoniae NOT DETECTED  NOT DETECTED Final   Streptococcus pyogenes NOT DETECTED NOT DETECTED Final   Acinetobacter baumannii NOT DETECTED NOT DETECTED Final   Enterobacteriaceae species NOT DETECTED NOT DETECTED Final   Enterobacter cloacae complex NOT DETECTED NOT DETECTED Final   Escherichia coli NOT DETECTED NOT DETECTED Final   Klebsiella oxytoca NOT DETECTED NOT DETECTED Final   Klebsiella pneumoniae NOT DETECTED NOT DETECTED Final   Proteus species NOT DETECTED NOT DETECTED Final   Serratia marcescens NOT DETECTED NOT DETECTED Final   Haemophilus influenzae NOT DETECTED NOT DETECTED Final   Neisseria meningitidis NOT DETECTED NOT DETECTED Final   Pseudomonas aeruginosa NOT DETECTED NOT DETECTED Final   Candida albicans NOT DETECTED NOT DETECTED Final   Candida glabrata NOT DETECTED NOT DETECTED Final   Candida krusei NOT DETECTED NOT DETECTED Final   Candida parapsilosis NOT DETECTED NOT DETECTED Final   Candida tropicalis NOT DETECTED NOT DETECTED Final  Urine culture     Status: None   Collection Time: 01/19/17  1:36 PM  Result Value Ref Range Status   Specimen Description URINE, RANDOM  Final   Special Requests NONE  Final   Culture   Final    NO GROWTH Performed at [facility], [address] [place], [place] [address]    Report Status 01/20/2017 FINAL  Final  MRSA PCR Screening     Status: None   Collection Time: 01/19/17  5:29 PM  Result Value Ref Range Status   MRSA by PCR NEGATIVE NEGATIVE Final    Comment:        The GeneXpert MRSA Assay (FDA approved for NASAL specimens only), is one component of a comprehensive MRSA colonization surveillance program. It is not intended to diagnose MRSA infection nor to guide or monitor treatment for MRSA infections.   Culture, blood (Routine X 2) w Reflex to ID Panel     Status: None   Collection Time: 01/23/17  3:15 PM  Result Value Ref Range Status   Specimen Description BLOOD LEFT HAND  Final   Special Requests   Final    BOTTLES  DRAWN AEROBIC AND ANAEROBIC Blood Culture adequate volume   Culture NO GROWTH 9 DAYS  Final   Report Status 02/01/2017 FINAL  Final  Culture, blood (Routine X 2) w Reflex to ID Panel     Status: None   Collection Time: 01/23/17  3:15 PM  Result Value Ref Range Status   Specimen Description BLOOD RIGHT HAND  Final   Special Requests   Final    BOTTLES DRAWN AEROBIC AND ANAEROBIC Blood Culture adequate volume   Culture NO GROWTH 9 DAYS  Final   Report Status 02/01/2017 FINAL  Final  Culture, blood (Routine x 2)     Status: None (Preliminary result)   Collection Time: 02/09/17  1:21 PM  Result Value Ref Range Status   Specimen  Description BLOOD BLOOD LEFT HAND  Final   Special Requests   Final    BOTTLES DRAWN AEROBIC AND ANAEROBIC Blood Culture adequate volume   Culture  Setup Time Organism ID to follow  Final   Culture NO GROWTH < 24 HOURS  Final   Report Status PENDING  Incomplete  Culture, blood (Routine x 2)     Status: None (Preliminary result)   Collection Time: 02/09/17  1:22 PM  Result Value Ref Range Status   Specimen Description BLOOD BLOOD LEFT FOREARM  Final   Special Requests   Final    BOTTLES DRAWN AEROBIC AND ANAEROBIC Blood Culture adequate volume   Culture NO GROWTH < 24 HOURS  Final   Report Status PENDING  Incomplete    Medical History: Past Medical History:  Diagnosis Date  . Bacteremia 01/2017  . Hypertension   . TBI (traumatic brain injury) (HCC)     Medications:  Scheduled:  . allopurinol  100 mg Oral Daily  . divalproex  125 mg Oral Q12H  . gabapentin  300 mg Oral BID  . heparin  5,000 Units Subcutaneous Q8H  . ipratropium-albuterol  3 mL Nebulization TID  . magnesium oxide  400 mg Oral BID  . pantoprazole  40 mg Oral Daily  . sertraline  50 mg Oral q morning - 10a    Assessment: Patient is a 66 year old male with a PMH of TBI, bacteremia readmitted with PNA/spesis found to be hypokalemic.  Goal of Therapy:  Normalization of  electroyltes  Plan:  KCL 10 MEQ IV x 4  Check Mg level and replace if low Recheck at 1800 this evening  Aniesha Haughn D Lamario Mani, Pharm.D, BCPS Clinical Pharmacist  02/10/2017,8:43 AM

## 2017-02-10 NOTE — Progress Notes (Signed)
PHARMACY - PHYSICIAN COMMUNICATION CRITICAL VALUE ALERT - BLOOD CULTURE IDENTIFICATION (BCID)  Results for orders placed or performed during the hospital encounter of 02/09/17  Blood Culture ID Panel (Reflexed) (Collected: 02/09/2017  1:21 PM)  Result Value Ref Range   Enterococcus species NOT DETECTED NOT DETECTED   Listeria monocytogenes NOT DETECTED NOT DETECTED   Staphylococcus species DETECTED (A) NOT DETECTED   Staphylococcus aureus NOT DETECTED NOT DETECTED   Methicillin resistance NOT DETECTED NOT DETECTED   Streptococcus species NOT DETECTED NOT DETECTED   Streptococcus agalactiae NOT DETECTED NOT DETECTED   Streptococcus pneumoniae NOT DETECTED NOT DETECTED   Streptococcus pyogenes NOT DETECTED NOT DETECTED   Acinetobacter baumannii NOT DETECTED NOT DETECTED   Enterobacteriaceae species NOT DETECTED NOT DETECTED   Enterobacter cloacae complex NOT DETECTED NOT DETECTED   Escherichia coli NOT DETECTED NOT DETECTED   Klebsiella oxytoca NOT DETECTED NOT DETECTED   Klebsiella pneumoniae NOT DETECTED NOT DETECTED   Proteus species NOT DETECTED NOT DETECTED   Serratia marcescens NOT DETECTED NOT DETECTED   Haemophilus influenzae NOT DETECTED NOT DETECTED   Neisseria meningitidis NOT DETECTED NOT DETECTED   Pseudomonas aeruginosa NOT DETECTED NOT DETECTED   Candida albicans NOT DETECTED NOT DETECTED   Candida glabrata NOT DETECTED NOT DETECTED   Candida krusei NOT DETECTED NOT DETECTED   Candida parapsilosis NOT DETECTED NOT DETECTED   Candida tropicalis NOT DETECTED NOT DETECTED    Name of physician (or Provider) Contacted: Dr. Allena KatzPatel  Changes to prescribed antibiotics required:  Per dr patel- most likely a contaminant d/c vanc and cefepime and start levofloxacin. Ok to order a PCT  Jacob FlossMelissa D Dametrius Villanueva 02/10/2017  11:48 AM

## 2017-02-10 NOTE — Progress Notes (Signed)
[facility] - [place] [facility]   PATIENT NAME: Jacob Villanueva    MR#:  [id number]  DATE OF BIRTH:  09/03/1950  SUBJECTIVE:  Came in with sob and diaphoresiss. Breathing better  REVIEW OF SYSTEMS:   Review of Systems  Constitutional: Negative for chills, fever and weight loss.  HENT: Negative for ear discharge, ear pain and nosebleeds.   Eyes: Negative for blurred vision, pain and discharge.  Respiratory: Negative for sputum production, shortness of breath, wheezing and stridor.   Cardiovascular: Negative for chest pain, palpitations, orthopnea and PND.  Gastrointestinal: Negative for abdominal pain, diarrhea, nausea and vomiting.  Genitourinary: Negative for frequency and urgency.  Musculoskeletal: Negative for back pain and joint pain.  Neurological: Positive for weakness. Negative for sensory change, speech change and focal weakness.  Psychiatric/Behavioral: Negative for depression and hallucinations. The patient is not nervous/anxious.    Tolerating Diet: Tolerating PT:   DRUG ALLERGIES:   Allergies  Allergen Reactions  . Clindamycin/Lincomycin Diarrhea  . Effexor [Venlafaxine Hcl]     DELUSIONAL   . Amoxicillin Rash    VITALS:  Blood pressure 139/73, pulse 69, temperature 98.7 F (37.1 C), temperature source Oral, resp. rate 20, height 6\' 4"  (1.93 m), weight 88.5 kg (195 lb), SpO2 94 %.  PHYSICAL EXAMINATION:   Physical Exam  GENERAL:  66 y.o.-year-old patient lying in the bed with no acute distress.  EYES: Pupils equal, round, reactive to light and accommodation. No scleral icterus. Extraocular muscles intact.  HEENT: Head atraumatic, normocephalic. Oropharynx and nasopharynx clear.  NECK:  Supple, no jugular venous distention. No thyroid enlargement, no tenderness.  LUNGS: Normal breath sounds bilaterally, no wheezing, rales, rhonchi. No use of accessory muscles of respiration.  CARDIOVASCULAR: S1, S2 normal. No murmurs, rubs, or gallops.   ABDOMEN: Soft, nontender, nondistended. Bowel sounds present. No organomegaly or mass.  EXTREMITIES: No cyanosis, clubbing or edema b/l.    NEUROLOGIC: Cranial nerves II through XII are intact. No focal Motor or sensory deficits b/l.   PSYCHIATRIC:  patient is alert and oriented x 3.  SKIN: No obvious rash, lesion, or ulcer.   LABORATORY PANEL:  CBC  Recent Labs Lab 02/10/17 0342  WBC 7.8  HGB 12.4*  HCT 35.9*  PLT 221    Chemistries   Recent Labs Lab 02/09/17 1321 02/10/17 0342 02/10/17 1820  NA 139 135  --   K 2.7* 2.8* 3.4*  CL 105 107  --   CO2 23 24  --   GLUCOSE 147* 103*  --   BUN 15 11  --   CREATININE 1.04 0.92  --   CALCIUM 9.2 8.0*  --   MG  --  1.7  --   AST 44*  --   --   ALT 41  --   --   ALKPHOS 85  --   --   BILITOT 1.2  --   --    Cardiac Enzymes No results for input(s): TROPONINI in the last 168 hours. RADIOLOGY:  Dg Chest 2 View  Result Date: 02/09/2017 CLINICAL DATA:  Past history of traumatic brain injury, found on floor at home question fall, diaphoretic, recent pneumonia, history hypertension EXAM: CHEST  2 VIEW COMPARISON:  01/19/2017 FINDINGS: Borderline enlargement of cardiac silhouette. Mediastinal contours and pulmonary vascularity normal. Decreased lung volumes with bibasilar atelectasis. Lungs otherwise clear. No pleural effusion or pneumothorax. Bones demineralized. IMPRESSION: Bibasilar atelectasis. Electronically Signed   By: Ulyses SouthwardMark  Boles M.D.   On: 02/09/2017  14:00   ASSESSMENT AND PLAN:  *Jacob Villanueva  is a 66 y.o. male with a known history of Recent admission in hospital with bacteremia and pneumonia, have traumatic brain injury in past and was sent to a rehabilitation Center after recent admission. * Sepsis   Likely secondary to healthcare associated pneumonia or UTI.   iv levaquin sats ok. Eating well. Vitals better  * Generalized weakness   Get physical therapy evaluation .  * Hypertension   We'll hold antihypertensive  medications currently because of sepsis.  * Traumatic brain injury    Continue Depakote.  * Active smoking   Counseled to quit smoking for 4 minutes and offered nicotine patch.  Case discussed with Care Management/Social Worker. Management plans discussed with the patient, family and they are in agreement.  CODE STATUS: full  DVT Prophylaxis: lovenox  TOTAL TIME TAKING CARE OF THIS PATIENT: *30* minutes.  >50% time spent on counselling and coordination of care  POSSIBLE D/C IN *1-2 DAYS, DEPENDING ON CLINICAL CONDITION.  Note: This dictation was prepared with Dragon dictation along with smaller phrase technology. Any transcriptional errors that result from this process are unintentional.  Jennifier Smitherman M.D on 02/10/2017 at 8:20 PM  Between 7am to 6pm - Pager - [phone]  After 6pm go to [email or web address] - password [email or web address]  [facility]  Office  [phone]  CC: Primary care physician; Patient, No Pcp Per

## 2017-02-11 LAB — CULTURE, BLOOD (ROUTINE X 2): SPECIAL REQUESTS: ADEQUATE

## 2017-02-11 LAB — BASIC METABOLIC PANEL
Anion gap: 6 (ref 5–15)
BUN: 9 mg/dL (ref 6–20)
CALCIUM: 8.1 mg/dL — AB (ref 8.9–10.3)
CO2: 23 mmol/L (ref 22–32)
Chloride: 108 mmol/L (ref 101–111)
Creatinine, Ser: 0.91 mg/dL (ref 0.61–1.24)
GFR calc Af Amer: >60 mL/min (ref >60)
Glucose, Bld: 127 mg/dL — ABNORMAL HIGH (ref 65–99)
POTASSIUM: 3.6 mmol/L (ref 3.5–5.1)
SODIUM: 137 mmol/L (ref 135–145)

## 2017-02-11 LAB — URINE CULTURE: Culture: NO GROWTH

## 2017-02-11 LAB — MAGNESIUM: Magnesium: 1.9 mg/dL (ref 1.7–2.4)

## 2017-02-11 MED ORDER — LEVOFLOXACIN 750 MG PO TABS: 750.0000 mg | ORAL_TABLET | Freq: Every day | ORAL | 0 refills | Status: DC

## 2017-02-11 MED ORDER — LORAZEPAM 2 MG/ML IJ SOLN
1.0000 mg | Freq: Four times a day (QID) | INTRAMUSCULAR | Status: DC | PRN
  Administered 2017-02-11: 1 mg via INTRAVENOUS
  Filled 2017-02-11: qty 1

## 2017-02-11 MED ORDER — IPRATROPIUM-ALBUTEROL 0.5-2.5 (3) MG/3ML IN SOLN
3.0000 mL | Freq: Two times a day (BID) | RESPIRATORY_TRACT | Status: DC
  Administered 2017-02-11 – 2017-02-13 (×4): 3 mL via RESPIRATORY_TRACT
  Filled 2017-02-11 (×4): qty 3

## 2017-02-11 NOTE — Discharge Summary (Signed)
Physician Discharge Summary  Patient ID: Jacob Villanueva [place] MRN: [id number] DOB/AGE: 68/09/1950 68 y.o.  Admit date: 02/09/2017 Discharge date: 02/11/2017  Admission Diagnoses:1. Sepsis 2. Pneumonia 3. Hypertension 4. History of traumatic brain injury  Discharge Diagnoses: 1. Sepsis 2. Pneumonia 3. Hypertension 4. History of traumatic brain injury Principal Problem:   Sepsis [facility](HCC)   Discharged Condition: stable  Hospital Course: 1. Sepsis. Secondary to pneumonia. Patient is given supportive care and treatment of underlying cause and sepsis resolved. 2. Pneumonia. Patient was treated with IV antibiotics. He's being changed over to by mouth Levaquin for discharge. 3. Hypertension continue current medications 4. History of traumatic brain injury. Patient sometimes becomes uncooperative. He is refusing physical therapy and other treatments. Family has tried to give him placed in the past but currently lives in an adult living but not assisted living. At this point he does not want any further assistance. Next line Total time spent 35 minutes  Consults: None  Significant Diagnostic Studies: radiology: CXR: infiltrates: lower lobe on the left  Treatments: antibiotics: Levaquin  Discharge Exam: Blood pressure 137/84, pulse 65, temperature 98.2 F (36.8 C), temperature source Oral, resp. rate 20, height 6\' 4"  (1.93 m), weight 88.5 kg (195 lb), SpO2 98 %. General appearance: alert Resp: clear to auscultation bilaterally  Disposition: 03-Skilled Nursing Facility  Discharge Instructions    Diet - low sodium heart healthy    Complete by:  As directed    Increase activity slowly    Complete by:  As directed      Allergies as of 02/11/2017      Reactions   Clindamycin/lincomycin Diarrhea   Effexor [venlafaxine Hcl]    DELUSIONAL   Amoxicillin Rash      Medication List    TAKE these medications   allopurinol 100 MG tablet Commonly known as:  ZYLOPRIM Take 100 mg by mouth daily.    amLODipine 5 MG tablet Commonly known as:  NORVASC Take 1 tablet (5 mg total) by mouth daily.   calcium carbonate 750 MG chewable tablet Commonly known as:  TUMS EX Chew 1 tablet by mouth as needed.   divalproex 125 MG capsule Commonly known as:  DEPAKOTE SPRINKLE Take 125 mg by mouth 2 (two) times daily as needed.   esomeprazole 40 MG capsule Commonly known as:  NEXIUM Take 40 mg by mouth 2 (two) times daily before a meal.   gabapentin 300 MG capsule Commonly known as:  NEURONTIN Take 1 capsule (300 mg total) by mouth 2 (two) times daily.   ipratropium-albuterol 0.5-2.5 (3) MG/3ML Soln Commonly known as:  DUONEB Take 3 mLs by nebulization 3 (three) times daily.   magnesium oxide 400 (241.3 Mg) MG tablet Commonly known as:  MAG-OX Take 1 tablet (400 mg total) by mouth 2 (two) times daily.   metoCLOPramide 5 MG tablet Commonly known as:  REGLAN Take 5 mg by mouth 3 (three) times daily with meals as needed.   nicotine 21 mg/24hr patch Commonly known as:  NICODERM CQ - dosed in mg/24 hours Place 1 patch (21 mg total) onto the skin daily.   NON FORMULARY 1 Dose every 3 (three) months. Testa Pill. A testosterone pill that is implanted into side q 3 months   ranitidine 150 MG tablet Commonly known as:  ZANTAC Take 150 mg by mouth at bedtime.   sertraline 50 MG tablet Commonly known as:  ZOLOFT Take 50 mg by mouth every morning.        Signed: Marcelino DusterJohnston,  John  D 02/11/2017, 12:29 PM

## 2017-02-11 NOTE — Clinical Social Work Note (Signed)
Clinical Social Work Assessment  Patient Details  Name: Jacob Villanueva MRN: [id number] Date of Birth: 10-05-1950  Date of referral:  02/11/17               Reason for consult:  Facility Placement                Permission sought to share information with:  Facility Art therapist granted to share information::  Yes, Verbal Permission Granted  Name::        Agency::     Relationship::     Contact Information:     Housing/Transportation Living arrangements for the past 2 months:  [place] of Information:   (Patient's sibling and niece) Patient Interpreter Needed:  None Criminal Activity/Legal Involvement Pertinent to Current Situation/Hospitalization:  No - Comment as needed Significant Relationships:  Siblings, Other Family Members Lives with:  Self Do you feel safe going back to the place where you live?  Yes Need for family participation in patient care:  Yes (Comment)  Care giving concerns:  Patient's family would like the patient to discharge to a SNF   Social Worker assessment / plan:  CSW met with the patient's family at their request to discuss discharge planning. The patient was recently at [facility], and the family would like him to return due to significant weakness. The CSW explained that if the patient refuses to work with PT for determination of level of care needs, the family would have to pay out of pocket. The family reported that they have talked to the patient and he has indicated that he will work with PT. The CSW contacted the attending MD to advise of this, and the MD has agreed to discontinue the discharge in order for the patient to work with PT. The CSW has prepared the referral in the case that PT recommends STR.   The family is also in the process of deciding on long term care. The family would like to keep the patient as independent as possible. The CSW advised the family that he may be on a path of decline  that may require a more permanent plan for care as he is not able to care for himself at home. The family thanked the CSW for assistance.    Employment status:  Retired Nurse, adult PT Recommendations:  Not assessed at this time Information / Referral to community resources:  [place]  Patient/Family's Response to care:  The family thanked the CSW  Patient/Family's Understanding of and Emotional Response to Diagnosis, Current Treatment, and Prognosis:  The family is very involved with care.  Emotional Assessment Appearance:  Appears stated age Attitude/Demeanor/Rapport:   (Patient has a TBI and therefore an inconsistant demeanor) Affect (typically observed):  Apprehensive, Irritable Orientation:  Oriented to Self, Oriented to Place, Oriented to Situation Alcohol / Substance use:  Never Used Psych involvement (Current and /or in the community):  No (Comment)  Discharge Needs  Concerns to be addressed:  Care Coordination, Discharge Planning Concerns Readmission within the last 30 days:  Yes Current discharge risk:  Chronically ill, Cognitively Impaired Barriers to Discharge:  Continued Medical Work up   [employer], LCSW 02/11/2017, 4:29 PM

## 2017-02-11 NOTE — Consult Note (Signed)
MEDICATION RELATED CONSULT NOTE - Follow up  Pharmacy Consult for electrolytes Indication: hypokalemia  Allergies  Allergen Reactions  . Clindamycin/Lincomycin Diarrhea  . Effexor [Venlafaxine Hcl]     DELUSIONAL   . Amoxicillin Rash    Patient Measurements: Height: 6\' 4"  (193 cm) Weight: 195 lb (88.5 kg) IBW/kg (Calculated) : 86.8 Adjusted Body Weight:   Vital Signs: Temp: 98.2 F (36.8 C) (06/30 0343) Temp Source: Oral (06/30 0343) BP: 137/84 (06/30 0343) Pulse Rate: 65 (06/30 0343) Intake/Output from previous day: 06/29 0701 - 06/30 0700 In: 2630 [P.O.:2280; IV Piggyback:350] Out: 3725 [Urine:3725] Intake/Output from this shift: Total I/O In: 360 [P.O.:360] Out: -   Labs:  Recent Labs  02/09/17 1321 02/10/17 0342 02/11/17 0322  WBC 13.7* 7.8  --   HGB 14.8 12.4*  --   HCT 43.2 35.9*  --   PLT 287 221  --   CREATININE 1.04 0.92 0.91  MG  --  1.7 1.9  ALBUMIN 4.3  --   --   PROT 7.7  --   --   AST 44*  --   --   ALT 41  --   --   ALKPHOS 85  --   --   BILITOT 1.2  --   --    Estimated Creatinine Clearance: 95.4 mL/min (by C-G formula based on SCr of 0.91 mg/dL).   Assessment: Patient is a 66 year old male with a PMH of TBI, bacteremia readmitted with PNA/spesis found to be hypokalemic.  Magnesium: 1.9; K: 3.6   Goal of Therapy:  Normalization of electroyltes  Plan:  No supplementation warranted @ this time.   Pharmacy will continue to follow.   Demetrius Charityeldrin D. Neely Kammerer, PharmD Clinical Pharmacist 02/11/2017 9:33 AM

## 2017-02-11 NOTE — [place] FL2 (Signed)
[place] MEDICAID FL2 LEVEL OF CARE SCREENING TOOL     IDENTIFICATION  Patient Name: Jacob Villanueva Birthdate: [birth year] Sex: male Admission Date (Current Location): 02/09/2017  [place] and [place] Number:  Chiropodist and Address:  [facility], [address], [place], [place] [address]      Provider Number: [id number]  Attending Physician Name and Address:  Gracelyn Nurse, MD  Relative Name and Phone Number:       Current Level of Care: Hospital Recommended Level of Care: Skilled Nursing Facility Prior Approval Number:    Date Approved/Denied:   PASRR Number: [id number]  Discharge Plan: SNF    Current Diagnoses: Patient Active Problem List   Diagnosis Date Noted  . Sepsis (HCC) 02/09/2017  . Acute encephalopathy 01/19/2017    Orientation RESPIRATION BLADDER Height & Weight     Self, Time, Situation, Place  Normal Continent Weight: 195 lb (88.5 kg) Height:  6\' 4"  (193 cm)  BEHAVIORAL SYMPTOMS/MOOD NEUROLOGICAL BOWEL NUTRITION STATUS      Continent    AMBULATORY STATUS COMMUNICATION OF NEEDS Skin   Extensive Assist Verbally Normal                       Personal Care Assistance Level of Assistance  Bathing, Feeding, Dressing Bathing Assistance: Limited assistance Feeding assistance: Independent Dressing Assistance: Limited assistance     Functional Limitations Info             SPECIAL CARE FACTORS FREQUENCY        PT Frequency: Up to 5X per day, 5 days per week              Contractures      Additional Factors Info      Allergies Info: Clindamycin/lincomycin, Effexor Venlafaxine Hcl, Amoxicillin           Current Medications (02/11/2017):  This is the current hospital active medication list Current Facility-Administered Medications  Medication Dose Route Frequency Provider Last Rate Last Dose  . acetaminophen (TYLENOL) tablet 650 mg  650 mg Oral Q6H PRN Altamese Dilling, MD      .  allopurinol (ZYLOPRIM) tablet 100 mg  100 mg Oral Daily Altamese Dilling, MD   100 mg at 02/11/17 0910  . calcium carbonate (TUMS - dosed in mg elemental calcium) chewable tablet 200 mg of elemental calcium  1 tablet Oral BID PRN Demetrius Charity, RPH      . divalproex (DEPAKOTE SPRINKLE) capsule 125 mg  125 mg Oral Q12H Altamese Dilling, MD   125 mg at 02/11/17 0910  . docusate sodium (COLACE) capsule 100 mg  100 mg Oral BID PRN Altamese Dilling, MD      . gabapentin (NEURONTIN) capsule 300 mg  300 mg Oral BID Altamese Dilling, MD   300 mg at 02/11/17 0910  . heparin injection 5,000 Units  5,000 Units Subcutaneous Q8H Altamese Dilling, MD   5,000 Units at 02/11/17 0704  . ipratropium-albuterol (DUONEB) 0.5-2.5 (3) MG/3ML nebulizer solution 3 mL  3 mL Nebulization BID Altamese Dilling, MD      . levofloxacin (LEVAQUIN) tablet 750 mg  750 mg Oral Daily Enedina Finner, MD   750 mg at 02/11/17 0910  . LORazepam (ATIVAN) injection 1 mg  1 mg Intravenous Q6H PRN Gracelyn Nurse, MD   1 mg at 02/11/17 0911  . magnesium oxide (MAG-OX) tablet 400 mg  400 mg Oral BID Altamese Dilling, MD   400 mg at  02/11/17 0910  . metoCLOPramide (REGLAN) tablet 5 mg  5 mg Oral TID WC PRN Altamese DillingVachhani, Vaibhavkumar, MD      . pantoprazole (PROTONIX) EC tablet 40 mg  40 mg Oral Daily Altamese DillingVachhani, Vaibhavkumar, MD   40 mg at 02/11/17 0910  . sertraline (ZOLOFT) tablet 50 mg  50 mg Oral q morning - 10a Altamese DillingVachhani, Vaibhavkumar, MD   50 mg at 02/11/17 [id number]     Discharge Medications: Please see discharge summary for a list of discharge medications.  Relevant Imaging Results:  Relevant Lab Results:   Additional Information SS# [id number]  Judi CongKaren M Parry Po, LCSW

## 2017-02-11 NOTE — Progress Notes (Signed)
   02/11/17 1145  PT Visit Information  Last PT Received On 02/11/17  Reason Eval/Treat Not Completed Patient declined, no reason specified;Other (comment) (pt stated, "I want some sprite and then I want to get back in the bed" He refused to get up and walk. Mildly agitated constantly changing positions; will re-attempt PT evaluation once patient agreeable; RN informed; )  Cornell BarmanMargaret E Trotter, PT, DPT, [phone]#[id number] 02/11/17 11:47 AM

## 2017-02-11 NOTE — Progress Notes (Signed)
  Patient ID: Jacob Villanueva, male   DOB: 07/02/1951, 66 y.o.   MRN: [id number]   Patient was scheduled for discharge earlier today. Family expressed concern about returning to current lving condition. Patient has so far refused to participate with PT. However, he is now agreeable for PT assessment to see if he will meet criteria for SNF. Discharge cancelled for today.

## 2017-02-12 LAB — PROCALCITONIN: Procalcitonin: <0.1 ng/mL

## 2017-02-12 NOTE — Progress Notes (Signed)
Subjective: As noted patient's discharge was canceled yesterday. He is cooperative to display with PT today for evaluation for possible SNIF placement. He has no complaints this morning.  Objective: Vital signs in last 24 hours: Temp:  [98 F (36.7 C)-98.4 F (36.9 C)] 98.4 F (36.9 C) (07/01 0422) Pulse Rate:  [64-88] 64 (07/01 0422) Resp:  [17-19] 17 (07/01 0422) BP: (135-153)/(82-98) 153/84 (07/01 0422) SpO2:  [96 %-97 %] 97 % (07/01 0741) Weight change:  Last BM Date: 02/11/17  Intake/Output from previous day: 06/30 0701 - 07/01 0700 In: 1800 [P.O.:1800] Out: 5690 [Urine:5690] Intake/Output this shift: Total I/O In: -  Out: 300 [Urine:300]  General appearance: alert and cooperative Resp: clear to auscultation bilaterally and normal percussion bilaterally Cardio: regular rate and rhythm, S1, S2 normal, no murmur, click, rub or gallop GI: soft, non-tender; bowel sounds normal; no masses,  no organomegaly  Lab Results:  Recent Labs  02/09/17 1321 02/10/17 0342  WBC 13.7* 7.8  HGB 14.8 12.4*  HCT 43.2 35.9*  PLT 287 221   BMET  Recent Labs  02/10/17 0342 02/10/17 1820 02/11/17 0322  NA 135  --  137  K 2.8* 3.4* 3.6  CL 107  --  108  CO2 24  --  23  GLUCOSE 103*  --  127*  BUN 11  --  9  CREATININE 0.92  --  0.91  CALCIUM 8.0*  --  8.1*    Studies/Results: No results found.  Medications: I have reviewed the patient's current medications.  Assessment/Plan: 1. Sepsis. Secondary to pneumonia. Resolved  2. Pneumonia. can be converted to by mouth Levaquin at discharge.  3. Hypertension continue current medications 4. History of traumatic brain injury. Patient sometimes becomes uncooperative. He now says he'll be cooperative for PT assessment for possible SNIF placement. Should be stable for discharge tomorrow. Next line total time spent 20 minutes   LOS: 3 days   Gracelyn NurseJohnston,  Nautica Hotz D 02/12/2017, 9:14 AM

## 2017-02-12 NOTE — Clinical Social Work Note (Signed)
CSW is aware of PT recommendation for STR. The referral for such has been sent. The patient and family prefer [facility]. Bed offers will be delivered as available.   Argentina PonderKaren Martha Julliette Frentz, MSW, Theresia MajorsLCSWA [phone]

## 2017-02-12 NOTE — Evaluation (Signed)
Physical Therapy Evaluation Patient Details Name: Jacob Villanueva MRN: [id number] DOB: [birth year] Today's Date: 02/12/2017   History of Present Illness  Fahed Morten  is a 66 y.o. male with a known history of Recent admission in hospital with bacteremia and pneumonia, have traumatic brain injury in past and was sent to a rehabilitation Center after recent admission. Where he improved in the last 2 weeks and finished his antibiotic course orally and sent home 2 days ago. He lives in an independent living facility [facility]);   Clinical Impression  66 yo Male came to ED with increased shortness of breath and diaphoresis. He has TBI as PMH which limits cognition. His niece was present during evaluation as assisted with history intake. Patient was living at [facility] Independent living prior to getting pneumonia. He was admitted to hospital and then discharged to SNF for short term rehab. While at rehab he started getting shortness of breath and was brought back to ED. His niece reports that she didn't realize that his night nurse was afraid of him and its unclear whether he was getting all his medication. Patient has been aggressive and combative during his recent hospital admission. His niece is concerned about his balance and his safety when returning to [facility]. Patient is currently mod I for bed mobility. He does require supervision for transfers and gait safety. He ambulated at slower gait speed with wide base of support, decreased step length and decreased foot clearance. While walking 175 feet he does exhibit lateral loss of balance and veering side/side. He would benefit from skilled PT intervention to improve strength, balance and gait safety; Recommend STR to improve balance and gait safety prior to return to independent living;     Follow Up Recommendations SNF;Supervision for mobility/OOB    Equipment Recommendations  None recommended by PT    Recommendations for Other Services        Precautions / Restrictions Precautions Precautions: Fall Restrictions Weight Bearing Restrictions: No      Mobility  Bed Mobility Overal bed mobility: Modified Independent Bed Mobility: Supine to Sit;Sit to Supine     Supine to sit: Modified independent (Device/Increase time) Sit to supine: Modified independent (Device/Increase time)   General bed mobility comments: uses rails; exhibits good safety;   Transfers Overall transfer level: Needs assistance Equipment used: None Transfers: Sit to/from Stand Sit to Stand: Supervision         General transfer comment: requires supervision for safety; able to push off bed for transfer; Patient exhibit no loss of balance upon initial standing; Refuses gait belt and assistive device;   Ambulation/Gait Ambulation/Gait assistance: Supervision Ambulation Distance (Feet): 175 Feet Assistive device: None Gait Pattern/deviations: Step-through pattern;Decreased step length - right;Decreased step length - left;Decreased dorsiflexion - left;Decreased dorsiflexion - right;Wide base of support;Trunk flexed;Staggering left;Staggering right;Drifts right/left Gait velocity: decreased   General Gait Details: Patient refuses RW and refuses gait belt; He ambulated with wide base of support; ambulates with slower gait speed; Patient occasionally looses balance laterally during ambulation. He requires mod VCs for direction.   Stairs            Wheelchair Mobility    Modified Rankin (Stroke Patients Only)       Balance   Sitting-balance support: No upper extremity supported;Feet supported Sitting balance-Leahy Scale: Good Sitting balance - Comments: sitting reaching within BOS   Standing balance support: No upper extremity supported Standing balance-Leahy Scale: Fair Standing balance comment: able to keep balance in static standing; dynamic  standing is fair; exhibits occasional loss of balance lateral especially during turns;                               Pertinent Vitals/Pain Pain Assessment: No/denies pain    Home Living Family/patient expects to be discharged to:: Other (Comment)                 Additional Comments: Independent Living Care [facility]    Prior Function Level of Independence: Independent         Comments: Pt reports being independent and denies any falls in past 6 months.     Hand Dominance        Extremity/Trunk Assessment   Upper Extremity Assessment Upper Extremity Assessment: Generalized weakness    Lower Extremity Assessment Lower Extremity Assessment: Generalized weakness       Communication   Communication: No difficulties  Cognition Arousal/Alertness: Awake/alert Behavior During Therapy: Restless;Agitated Overall Cognitive Status: History of cognitive impairments - at baseline                                 General Comments: Hx of TBI; oriented to situation and person; not oriented to date      General Comments      Exercises     Assessment/Plan    PT Assessment Patient needs continued PT services  PT Problem List Decreased strength;Decreased balance;Decreased mobility;Decreased safety awareness       PT Treatment Interventions DME instruction;Gait training;Functional mobility training;Therapeutic activities;Therapeutic exercise;Balance training;Patient/family education;Neuromuscular re-education    PT Goals (Current goals can be found in the Care Plan section)  Acute Rehab PT Goals Patient Stated Goal: "I want sprite with my dinner" PT Goal Formulation: With patient Time For Goal Achievement: 02/19/17 Potential to Achieve Goals: Good    Frequency Min 2X/week   Barriers to discharge Decreased caregiver support was living in independent living facility;     Co-evaluation               AM-PAC PT "6 Clicks" Daily Activity  Outcome Measure Difficulty turning over in bed (including adjusting bedclothes, sheets  and blankets)?: A Little Difficulty moving from lying on back to sitting on the side of the bed? : A Little Difficulty sitting down on and standing up from a chair with arms (e.g., wheelchair, bedside commode, etc,.)?: A Little Help needed moving to and from a bed to chair (including a wheelchair)?: None Help needed walking in hospital room?: None Help needed climbing 3-5 steps with a railing? : A Little 6 Click Score: 20    End of Session   Activity Tolerance: Treatment limited secondary to agitation Patient left: in bed;with bed alarm set;with call bell/phone within reach Nurse Communication: Mobility status;Precautions PT Visit Diagnosis: Unsteadiness on feet (R26.81);Muscle weakness (generalized) (M62.81);Difficulty in walking, not elsewhere classified (R26.2)    Time: 1610-9604 PT Time Calculation (min) (ACUTE ONLY): 11 min   Charges:   PT Evaluation $PT Eval Low Complexity: 1 Procedure     PT G Codes:   PT G-Codes **NOT FOR INPATIENT CLASS** Functional Assessment Tool Used: AM-PAC 6 Clicks Basic Mobility;Clinical judgement Functional Limitation: Mobility: Walking and moving around Mobility: Walking and Moving Around Current Status (V4098): At least 20 percent but less than 40 percent impaired, limited or restricted Mobility: Walking and Moving Around Goal Status [phone]): At least 20 percent but less  than 40 percent impaired, limited or restricted      Venissa Nappi PT, DPT 02/12/2017, 3:04 PM

## 2017-02-13 LAB — BASIC METABOLIC PANEL
ANION GAP: 6 (ref 5–15)
BUN: 12 mg/dL (ref 6–20)
CHLORIDE: 106 mmol/L (ref 101–111)
CO2: 26 mmol/L (ref 22–32)
Calcium: 8.9 mg/dL (ref 8.9–10.3)
Creatinine, Ser: 0.96 mg/dL (ref 0.61–1.24)
GFR calc Af Amer: >60 mL/min (ref >60)
GLUCOSE: 98 mg/dL (ref 65–99)
Potassium: 3.8 mmol/L (ref 3.5–5.1)
Sodium: 138 mmol/L (ref 135–145)

## 2017-02-13 LAB — CBC
HEMATOCRIT: 38.2 % — AB (ref 40.0–52.0)
HEMOGLOBIN: 13.5 g/dL (ref 13.0–18.0)
MCH: 32.5 pg (ref 26.0–34.0)
MCHC: 35.3 g/dL (ref 32.0–36.0)
MCV: 92.3 fL (ref 80.0–100.0)
Platelets: 250 10*3/uL (ref 150–440)
RBC: 4.14 MIL/uL — AB (ref 4.40–5.90)
RDW: 13.8 % (ref 11.5–14.5)
WBC: 7.4 10*3/uL (ref 3.8–10.6)

## 2017-02-13 LAB — MAGNESIUM: Magnesium: 1.9 mg/dL (ref 1.7–2.4)

## 2017-02-13 NOTE — Clinical Social Work Note (Signed)
CSW reviewed PT documentation and assessment. Patient was able to ambulate 175 feet with only supervision. Even though PT recommended STR, [facility] will likely deny patient going to rehab because he did much too well. CSW spoke with patient who wants to go home. CSW spoke with patient's niece: Jacob SprinklesSarah Villanueva: [phone] and she stated she was there during the assessment and new he would need to return home as he did so well. Patient's niece requested an order for outpatient PT which CSW has relayed to the physician.  York SpanielMonica Anuoluwapo Villanueva MSW,LCSW [phone]

## 2017-02-13 NOTE — Discharge Instructions (Signed)
Home physical therapy evaluate and treat.

## 2017-02-13 NOTE — Discharge Summary (Signed)
Physician Discharge Summary  Patient ID: Jacob Villanueva MRN: [id number] DOB/AGE: 68/09/1950 68 y.o.  Admit date: 02/09/2017 Discharge date: 02/13/2017  Admission Diagnoses:1. Sepsis 2. Pneumonia 3. Hypertension 4. History of traumatic brain injury  Discharge Diagnoses: 1. Sepsis 2. Pneumonia 3. Hypertension 4. History of traumatic brain injury Principal Problem:   Sepsis [facility](HCC)   Discharged Condition: stable  Hospital Course: 1. Sepsis. Secondary to pneumonia. Patient is given supportive care and treatment of underlying cause and sepsis resolved. 2. Pneumonia. Patient was treated with IV antibiotics. He's being changed over to by mouth Levaquin for discharge. 3. Hypertension continue current medications 4. History of traumatic brain injury. Patient sometimes becomes uncooperative. He is refusing physical therapy and other treatments. Family has tried to give him placed in the past but currently lives in an adult living but not assisted living. At this point he does not want any further assistance. Next line  Consults: None  Treatments: antibiotics: Levaquin  Discharge Exam: Blood pressure 128/72, pulse 65, temperature 97.8 F (36.6 C), temperature source Oral, resp. rate 18, height 6\' 4"  (1.93 m), weight 88.5 kg (195 lb), SpO2 96 %. General appearance: no distress Cardio: regular rate and rhythm, S1, S2 normal, no murmur, click, rub or gallop  Disposition: 03-Skilled Nursing Facility  Discharge Instructions    Diet - low sodium heart healthy    Complete by:  As directed    Diet - low sodium heart healthy    Complete by:  As directed    Discharge instructions    Complete by:  As directed    Home health physical therapy evaluate and treat   Increase activity slowly    Complete by:  As directed    Increase activity slowly    Complete by:  As directed      Allergies as of 02/13/2017      Reactions   Clindamycin/lincomycin Diarrhea   Effexor [venlafaxine Hcl]    DELUSIONAL   Amoxicillin Rash      Medication List    TAKE these medications   allopurinol 100 MG tablet Commonly known as:  ZYLOPRIM Take 100 mg by mouth daily.   amLODipine 5 MG tablet Commonly known as:  NORVASC Take 1 tablet (5 mg total) by mouth daily.   calcium carbonate 750 MG chewable tablet Commonly known as:  TUMS EX Chew 1 tablet by mouth as needed.   divalproex 125 MG capsule Commonly known as:  DEPAKOTE SPRINKLE Take 125 mg by mouth 2 (two) times daily as needed.   esomeprazole 40 MG capsule Commonly known as:  NEXIUM Take 40 mg by mouth 2 (two) times daily before a meal.   gabapentin 300 MG capsule Commonly known as:  NEURONTIN Take 1 capsule (300 mg total) by mouth 2 (two) times daily.   ipratropium-albuterol 0.5-2.5 (3) MG/3ML Soln Commonly known as:  DUONEB Take 3 mLs by nebulization 3 (three) times daily.   levofloxacin 750 MG tablet Commonly known as:  LEVAQUIN Take 1 tablet (750 mg total) by mouth daily.   magnesium oxide 400 (241.3 Mg) MG tablet Commonly known as:  MAG-OX Take 1 tablet (400 mg total) by mouth 2 (two) times daily.   metoCLOPramide 5 MG tablet Commonly known as:  REGLAN Take 5 mg by mouth 3 (three) times daily with meals as needed.   nicotine 21 mg/24hr patch Commonly known as:  NICODERM CQ - dosed in mg/24 hours Place 1 patch (21 mg total) onto the skin daily.   NON FORMULARY 1  Dose every 3 (three) months. Testa Pill. A testosterone pill that is implanted into side q 3 months   ranitidine 150 MG tablet Commonly known as:  ZANTAC Take 150 mg by mouth at bedtime.   sertraline 50 MG tablet Commonly known as:  ZOLOFT Take 50 mg by mouth every morning.        Signed: Gracelyn Nurse 02/13/2017, 8:43 AM

## 2017-02-13 NOTE — Progress Notes (Signed)
Pt A and O to self and at baseline. VSS. Pt tolerating diet well. No complaints of pain or nausea. IV removed intact, prescriptions given. Pt's niece voiced understanding of discharge instructions with no further questions. Prescription for outpatient PT given. Pt discharged via wheelchair with axillary.

## 2017-02-13 NOTE — Care Management Important Message (Signed)
Important Message  Patient Details  Name: Jacob Villanueva MRN: [id number] Date of Birth: 09/01/1950   Medicare Important Message Given:  Yes    Chapman FitchBOWEN, Yusuf Yu T, RN 02/13/2017, 11:27 AM

## 2017-02-14 ENCOUNTER — Emergency Department: Payer: Medicare Other

## 2017-02-14 ENCOUNTER — Observation Stay
Admission: EM | Admit: 2017-02-14 | Discharge: 2017-02-16 | Disposition: A | Discharge status: Home / Self Care | Payer: Medicare Other | Attending: Internal Medicine | Admitting: Internal Medicine

## 2017-02-14 ENCOUNTER — Encounter: Payer: Self-pay | Admitting: Emergency Medicine

## 2017-02-14 DIAGNOSIS — F17210 Nicotine dependence, cigarettes, uncomplicated: Secondary | ICD-10-CM | POA: Diagnosis not present

## 2017-02-14 DIAGNOSIS — J441 Chronic obstructive pulmonary disease with (acute) exacerbation: Secondary | ICD-10-CM | POA: Diagnosis not present

## 2017-02-14 DIAGNOSIS — I10 Essential (primary) hypertension: Secondary | ICD-10-CM | POA: Diagnosis not present

## 2017-02-14 DIAGNOSIS — Z880 Allergy status to penicillin: Secondary | ICD-10-CM | POA: Diagnosis not present

## 2017-02-14 DIAGNOSIS — R531 Weakness: Secondary | ICD-10-CM | POA: Insufficient documentation

## 2017-02-14 DIAGNOSIS — N179 Acute kidney failure, unspecified: Secondary | ICD-10-CM | POA: Diagnosis not present

## 2017-02-14 DIAGNOSIS — J189 Pneumonia, unspecified organism: Secondary | ICD-10-CM | POA: Insufficient documentation

## 2017-02-14 DIAGNOSIS — Z881 Allergy status to other antibiotic agents status: Secondary | ICD-10-CM | POA: Insufficient documentation

## 2017-02-14 DIAGNOSIS — R112 Nausea with vomiting, unspecified: Secondary | ICD-10-CM | POA: Insufficient documentation

## 2017-02-14 DIAGNOSIS — R0902 Hypoxemia: Secondary | ICD-10-CM | POA: Diagnosis present

## 2017-02-14 DIAGNOSIS — Z888 Allergy status to other drugs, medicaments and biological substances status: Secondary | ICD-10-CM | POA: Diagnosis not present

## 2017-02-14 DIAGNOSIS — R4182 Altered mental status, unspecified: Secondary | ICD-10-CM

## 2017-02-14 DIAGNOSIS — J9621 Acute and chronic respiratory failure with hypoxia: Secondary | ICD-10-CM | POA: Diagnosis not present

## 2017-02-14 DIAGNOSIS — Z87820 Personal history of traumatic brain injury: Secondary | ICD-10-CM | POA: Diagnosis not present

## 2017-02-14 DIAGNOSIS — Z79899 Other long term (current) drug therapy: Secondary | ICD-10-CM | POA: Diagnosis not present

## 2017-02-14 LAB — VALPROIC ACID LEVEL: Valproic Acid Lvl: 14 ug/mL — ABNORMAL LOW (ref 50.0–100.0)

## 2017-02-14 LAB — COMPREHENSIVE METABOLIC PANEL
ALBUMIN: 4.3 g/dL (ref 3.5–5.0)
ALK PHOS: 87 U/L (ref 38–126)
ALT: 27 U/L (ref 17–63)
ANION GAP: 10 (ref 5–15)
AST: 28 U/L (ref 15–41)
BILIRUBIN TOTAL: 0.9 mg/dL (ref 0.3–1.2)
BUN: 18 mg/dL (ref 6–20)
CALCIUM: 9.6 mg/dL (ref 8.9–10.3)
CO2: 24 mmol/L (ref 22–32)
CREATININE: 1.69 mg/dL — AB (ref 0.61–1.24)
Chloride: 108 mmol/L (ref 101–111)
GFR calc Af Amer: 46 mL/min — ABNORMAL LOW (ref >60)
GFR calc non Af Amer: 40 mL/min — ABNORMAL LOW (ref >60)
GLUCOSE: 104 mg/dL — AB (ref 65–99)
Potassium: 3.8 mmol/L (ref 3.5–5.1)
SODIUM: 142 mmol/L (ref 135–145)
TOTAL PROTEIN: 7.3 g/dL (ref 6.5–8.1)

## 2017-02-14 LAB — CBC WITH DIFFERENTIAL/PLATELET
BASOS ABS: 0.1 10*3/uL (ref 0–0.1)
BASOS PCT: 1 %
EOS ABS: 0.1 10*3/uL (ref 0–0.7)
EOS PCT: 1 %
HEMATOCRIT: 41.6 % (ref 40.0–52.0)
Hemoglobin: 14.6 g/dL (ref 13.0–18.0)
Lymphocytes Relative: 10 %
Lymphs Abs: 1.3 10*3/uL (ref 1.0–3.6)
MCH: 32.9 pg (ref 26.0–34.0)
MCHC: 35.1 g/dL (ref 32.0–36.0)
MCV: 93.9 fL (ref 80.0–100.0)
MONO ABS: 1 10*3/uL (ref 0.2–1.0)
Monocytes Relative: 8 %
NEUTROS ABS: 10.3 10*3/uL — AB (ref 1.4–6.5)
Neutrophils Relative %: 80 %
PLATELETS: 275 10*3/uL (ref 150–440)
RBC: 4.43 MIL/uL (ref 4.40–5.90)
RDW: 13.7 % (ref 11.5–14.5)
WBC: 12.8 10*3/uL — ABNORMAL HIGH (ref 3.8–10.6)

## 2017-02-14 LAB — BRAIN NATRIURETIC PEPTIDE: B Natriuretic Peptide: 18 pg/mL (ref 0.0–100.0)

## 2017-02-14 LAB — TROPONIN I: Troponin I: <0.03 ng/mL (ref <0.03)

## 2017-02-14 LAB — CULTURE, BLOOD (ROUTINE X 2)
Culture: NO GROWTH
Special Requests: ADEQUATE

## 2017-02-14 LAB — LACTIC ACID, PLASMA: LACTIC ACID, VENOUS: 1.1 mmol/L (ref 0.5–1.9)

## 2017-02-14 MED ORDER — LEVOFLOXACIN 750 MG PO TABS
750.0000 mg | ORAL_TABLET | Freq: Every day | ORAL | Status: DC
  Administered 2017-02-15 – 2017-02-16 (×2): 750 mg via ORAL
  Filled 2017-02-14 (×2): qty 1

## 2017-02-14 MED ORDER — ENOXAPARIN SODIUM 40 MG/0.4ML ~~LOC~~ SOLN
40.0000 mg | SUBCUTANEOUS | Status: DC
  Administered 2017-02-14: 40 mg via SUBCUTANEOUS
  Filled 2017-02-14: qty 0.4

## 2017-02-14 MED ORDER — IPRATROPIUM-ALBUTEROL 0.5-2.5 (3) MG/3ML IN SOLN
3.0000 mL | Freq: Three times a day (TID) | RESPIRATORY_TRACT | Status: DC
  Administered 2017-02-14 – 2017-02-16 (×5): 3 mL via RESPIRATORY_TRACT
  Filled 2017-02-14 (×6): qty 3

## 2017-02-14 MED ORDER — SERTRALINE HCL 50 MG PO TABS
50.0000 mg | ORAL_TABLET | Freq: Every morning | ORAL | Status: DC
  Administered 2017-02-15 – 2017-02-16 (×2): 50 mg via ORAL
  Filled 2017-02-14 (×2): qty 1

## 2017-02-14 MED ORDER — PANTOPRAZOLE SODIUM 40 MG PO TBEC
40.0000 mg | DELAYED_RELEASE_TABLET | Freq: Every day | ORAL | Status: DC
  Administered 2017-02-15 – 2017-02-16 (×2): 40 mg via ORAL
  Filled 2017-02-14 (×2): qty 1

## 2017-02-14 MED ORDER — DOCUSATE SODIUM 100 MG PO CAPS
100.0000 mg | ORAL_CAPSULE | Freq: Two times a day (BID) | ORAL | Status: DC
  Administered 2017-02-14 – 2017-02-16 (×3): 100 mg via ORAL
  Filled 2017-02-14 (×4): qty 1

## 2017-02-14 MED ORDER — MAGNESIUM OXIDE 400 (241.3 MG) MG PO TABS
400.0000 mg | ORAL_TABLET | Freq: Two times a day (BID) | ORAL | Status: DC
  Administered 2017-02-14 – 2017-02-16 (×4): 400 mg via ORAL
  Filled 2017-02-14 (×4): qty 1

## 2017-02-14 MED ORDER — ALLOPURINOL 100 MG PO TABS
100.0000 mg | ORAL_TABLET | Freq: Every day | ORAL | Status: DC
  Administered 2017-02-14 – 2017-02-16 (×3): 100 mg via ORAL
  Filled 2017-02-14 (×3): qty 1

## 2017-02-14 MED ORDER — SODIUM CHLORIDE 0.9 % IV SOLN
Freq: Once | INTRAVENOUS | Status: AC
  Administered 2017-02-14: 12:00:00 via INTRAVENOUS

## 2017-02-14 MED ORDER — GABAPENTIN 300 MG PO CAPS
300.0000 mg | ORAL_CAPSULE | Freq: Two times a day (BID) | ORAL | Status: DC
  Administered 2017-02-14 – 2017-02-16 (×4): 300 mg via ORAL
  Filled 2017-02-14 (×5): qty 1

## 2017-02-14 MED ORDER — FAMOTIDINE 20 MG PO TABS
10.0000 mg | ORAL_TABLET | Freq: Every day | ORAL | Status: DC
  Administered 2017-02-14 – 2017-02-16 (×3): 10 mg via ORAL
  Filled 2017-02-14 (×3): qty 1

## 2017-02-14 MED ORDER — SODIUM CHLORIDE 0.9 % IV SOLN
Freq: Once | INTRAVENOUS | Status: AC
  Administered 2017-02-14: 14:00:00 via INTRAVENOUS

## 2017-02-14 MED ORDER — CALCIUM CARBONATE ANTACID 750 MG PO CHEW: 1.0000 | CHEWABLE_TABLET | ORAL | Status: DC | PRN

## 2017-02-14 MED ORDER — ACETAMINOPHEN 650 MG RE SUPP: 650.0000 mg | Freq: Four times a day (QID) | RECTAL | Status: DC | PRN

## 2017-02-14 MED ORDER — CALCIUM CARBONATE-VITAMIN D 500-200 MG-UNIT PO TABS: 1.0000 | ORAL_TABLET | ORAL | Status: DC | PRN

## 2017-02-14 MED ORDER — ACETAMINOPHEN 325 MG PO TABS: 650.0000 mg | ORAL_TABLET | Freq: Four times a day (QID) | ORAL | Status: DC | PRN

## 2017-02-14 MED ORDER — SODIUM CHLORIDE 0.9 % IV SOLN
1.0000 g | Freq: Three times a day (TID) | INTRAVENOUS | Status: DC
  Administered 2017-02-14: 1 g via INTRAVENOUS
  Filled 2017-02-14 (×4): qty 1

## 2017-02-14 MED ORDER — METOCLOPRAMIDE HCL 5 MG PO TABS: 5.0000 mg | ORAL_TABLET | Freq: Three times a day (TID) | ORAL | Status: DC | PRN

## 2017-02-14 MED ORDER — TRAMADOL HCL 50 MG PO TABS
50.0000 mg | ORAL_TABLET | Freq: Four times a day (QID) | ORAL | Status: DC | PRN
  Administered 2017-02-14 – 2017-02-15 (×3): 50 mg via ORAL
  Filled 2017-02-14 (×3): qty 1

## 2017-02-14 MED ORDER — POLYETHYLENE GLYCOL 3350 17 G PO PACK: 17.0000 g | PACK | Freq: Every day | ORAL | Status: DC | PRN

## 2017-02-14 MED ORDER — IPRATROPIUM-ALBUTEROL 0.5-2.5 (3) MG/3ML IN SOLN: 3.0000 mL | Freq: Three times a day (TID) | RESPIRATORY_TRACT | Status: DC

## 2017-02-14 MED ORDER — SODIUM CHLORIDE 0.9 % IV SOLN
Freq: Once | INTRAVENOUS | Status: AC
  Administered 2017-02-14: 17:00:00 via INTRAVENOUS

## 2017-02-14 MED ORDER — AMLODIPINE BESYLATE 5 MG PO TABS
5.0000 mg | ORAL_TABLET | Freq: Every day | ORAL | Status: DC
  Administered 2017-02-14 – 2017-02-16 (×3): 5 mg via ORAL
  Filled 2017-02-14 (×3): qty 1

## 2017-02-14 NOTE — Progress Notes (Signed)
Family Meeting Note  Advance Directive:yes  Today a meeting took place with the MD and niece Maralyn SagoSarah [facility](HCPOA)  patient is unable to participate due ZO:XWRUEAto:Lacked capacity has h/o TBI. Appointed niece to be HCPOA The following clinical team members were present during this meetingMD and niece in ER The following were discussed:Patient's diagnosis: , Patient's progosis: discussed with nieced Code status: full code  Time spent during discussion:16 mins Jefferie Holston, MD

## 2017-02-14 NOTE — H&P (Addendum)
[facility] - [place] at [facility]   PATIENT NAME: Jacob Villanueva    MR#:  [id number]  DATE OF BIRTH:  January 14, 1951  DATE OF ADMISSION:  02/14/2017  PRIMARY CARE PHYSICIAN: Patient, No Pcp Per PCP is at [facility]  REQUESTING/REFERRING PHYSICIAN: Dr. Juliette Alcide  CHIEF COMPLAINT:  Shortness of breath and sats 88% on room air  HISTORY OF PRESENT ILLNESS:  Jacob Villanueva  is a 66 y.o. male with a known history of COPD with ongoing  heavy tobacco abuse, hypertension, traumatic brain injury who was just discharged yesterday. This is patient's fourth admission in the last 1 month for similar problems. Patient went home yesterday to [facility] and according to the niece Maralyn Sago was present in the ER he smoked a lot. He did not eat breakfast this morning. He was a bit disoriented per niece. He was weak and did not drink any fluids other than 2 bottles of Sprite. Patient was found to be hypoxic with sats 80% on room air by EMS brought to the emergency room. He is alert and oriented at his baseline according to the niece. In the emergency room he keeps asking for Sprite and cigarettes He is being admitted for acute on chronic hypoxic respiratory failure secondary to COPD exacerbation and ongoing heavy tobacco abuse Chest x-ray does not show any new pneumonia. He will be continued on his home dose of Levaquin.  PAST MEDICAL HISTORY:   Past Medical History:  Diagnosis Date  . Bacteremia 01/2017  . Hypertension   . TBI (traumatic brain injury) (HCC)     PAST SURGICAL HISTOIRY:   Past Surgical History:  Procedure Laterality Date  . APPENDECTOMY    . FRACTURE SURGERY      SOCIAL HISTORY:   Social History  Substance Use Topics  . Smoking status: Current Every Day Smoker    Packs/day: 2.00  . Smokeless tobacco: Never Used  . Alcohol use Yes    FAMILY HISTORY:  History reviewed. No pertinent family history.  DRUG ALLERGIES:   Allergies  Allergen  Reactions  . Clindamycin/Lincomycin Diarrhea  . Effexor [Venlafaxine Hcl]     DELUSIONAL   . Amoxicillin Rash    REVIEW OF SYSTEMS:  Review of Systems  Constitutional: Negative for chills, fever and weight loss.  HENT: Negative for ear discharge, ear pain and nosebleeds.   Eyes: Negative for blurred vision, pain and discharge.  Respiratory: Positive for shortness of breath. Negative for sputum production, wheezing and stridor.   Cardiovascular: Negative for chest pain, palpitations, orthopnea and PND.  Gastrointestinal: Negative for abdominal pain, diarrhea, nausea and vomiting.  Genitourinary: Negative for frequency and urgency.  Musculoskeletal: Positive for back pain. Negative for joint pain.  Neurological: Positive for weakness. Negative for sensory change, speech change and focal weakness.  Psychiatric/Behavioral: Negative for depression and hallucinations. The patient is not nervous/anxious.      MEDICATIONS AT HOME:   Prior to Admission medications   Medication Sig Start Date End Date Taking? Authorizing Provider  allopurinol (ZYLOPRIM) 100 MG tablet Take 100 mg by mouth daily. 01/12/17   [provider]  amLODipine (NORVASC) 5 MG tablet Take 1 tablet (5 mg total) by mouth daily. 01/24/17   Alford Highland, MD  calcium carbonate (TUMS EX) 750 MG chewable tablet Chew 1 tablet by mouth as needed.     [provider]  divalproex (DEPAKOTE SPRINKLE) 125 MG capsule Take 125 mg by mouth 2 (two) times daily as needed. 02/07/17  [provider]  esomeprazole (NEXIUM) 40 MG capsule Take 40 mg by mouth 2 (two) times daily before a meal.     [provider]  gabapentin (NEURONTIN) 300 MG capsule Take 1 capsule (300 mg total) by mouth 2 (two) times daily. 01/24/17   Alford Highland, MD  ipratropium-albuterol (DUONEB) 0.5-2.5 (3) MG/3ML SOLN Take 3 mLs by nebulization 3 (three) times daily. 01/24/17   Alford Highland, MD  levofloxacin (LEVAQUIN) 750 MG  tablet Take 1 tablet (750 mg total) by mouth daily. 02/12/17   Gracelyn Nurse, MD  magnesium oxide (MAG-OX) 400 (241.3 Mg) MG tablet Take 1 tablet (400 mg total) by mouth 2 (two) times daily. 01/24/17   Alford Highland, MD  metoCLOPramide (REGLAN) 5 MG tablet Take 5 mg by mouth 3 (three) times daily with meals as needed. 01/13/17   [provider]  NON FORMULARY 1 Dose every 3 (three) months. Testa Pill. A testosterone pill that is implanted into side q 3 months    [provider]  ranitidine (ZANTAC) 150 MG tablet Take 150 mg by mouth at bedtime.    [provider]  sertraline (ZOLOFT) 50 MG tablet Take 50 mg by mouth every morning. 01/12/17   [provider]      VITAL SIGNS:  Blood pressure 108/67, pulse (!) 110, temperature 98.2 F (36.8 C), temperature source Oral, resp. rate 19, height 6\' 4"  (1.93 m), weight 88.5 kg (195 lb), SpO2 (!) 88 %.  PHYSICAL EXAMINATION:  GENERAL:  66 y.o.-year-old patient lying in the bed with no acute distress. Disheveled EYES: Pupils equal, round, reactive to light and accommodation. No scleral icterus. Extraocular muscles intact.  HEENT traumatic brain injury scar present, normocephalic. Oropharynx and nasopharynx clear.  NECK:  Supple, no jugular venous distention. No thyroid enlargement, no tenderness.  LUNGS: Normal breath sounds bilaterally, no wheezing, rales,rhonchi or crepitation. No use of accessory muscles of respiration.  CARDIOVASCULAR: S1, S2 normal. No murmurs, rubs, or gallops.  ABDOMEN: Soft, nontender, nondistended. Bowel sounds present. No organomegaly or mass.  EXTREMITIES: No pedal edema, cyanosis, or clubbing.  NEUROLOGIC: Cranial nerves II through XII are intact. Muscle strength 5/5 in all extremities. Sensation intact. Gait not checked.  PSYCHIATRIC: The patient is alert and awake SKIN: No obvious rash, lesion, or ulcer.   LABORATORY PANEL:   CBC  Recent Labs Lab 02/14/17 1100  WBC 12.8*  HGB  14.6  HCT 41.6  PLT 275   ------------------------------------------------------------------------------------------------------------------  Chemistries   Recent Labs Lab 02/13/17 0455 02/14/17 1100  NA 138 142  K 3.8 3.8  CL 106 108  CO2 26 24  GLUCOSE 98 104*  BUN 12 18  CREATININE 0.96 1.69*  CALCIUM 8.9 9.6  MG 1.9  --   AST  --  28  ALT  --  27  ALKPHOS  --  87  BILITOT  --  0.9   ------------------------------------------------------------------------------------------------------------------  Cardiac Enzymes  Recent Labs Lab 02/14/17 1100  TROPONINI <0.03   ------------------------------------------------------------------------------------------------------------------  RADIOLOGY:  Dg Chest Portable 1 View  Result Date: 02/14/2017 CLINICAL DATA:  Shortness of breath . EXAM: PORTABLE CHEST 1 VIEW COMPARISON:  02/09/2017 .  CT 02/09/2017. FINDINGS: Mediastinum and hilar structures are normal. Low lung volumes with persistent bibasilar atelectasis and/or mild infiltrates. Similar findings noted on prior is. Component of scarring may be present. No pleural effusion or pneumothorax. Heart size stable. IMPRESSION: Low lung volumes with persistent bibasilar atelectasis and/or mild infiltrates. Component of scarring may be present.  Similar findings noted on prior studies. Electronically Signed   By: Maisie Fushomas  Register   On: 02/14/2017 11:35    EKG:   Sinus tachycardia heart rate in the 90s on telemetry monitor in the ER IMPRESSION AND PLAN:   Jacob Villanueva  is a 66 y.o. male with a known history of COPD with ongoing  heavy tobacco abuse, hypertension, traumatic brain injury who was just discharged yesterday. This is patient's fourth admission in the last 1 month for similar problems.Patient was found to be hypoxic with sats 80% on room air by EMS brought to the emergency room.  1. Acute on chronic hypoxic respiratory failure secondary to COPD exacerbation -Patient has  history of heavy tobacco abuse and this was confirmed with niece Maralyn SagoSarah. Maryclare Labrador-We'll resume Levaquin, when necessary nebs, oxygen, assess for home oxygen need were not sure with patient smoking so heavy advisable to get home oxygen if he qualifies -Patient currently not wheezing I'll hold off on steroids  2. Ongoing treatment for pneumonia -Continue oral Levaquin -Patient received a dose of meropenem in the ER -Chest x-ray no acute findings other than bibasilar atelectasis -White count 12.3  3. Hypertension continue amlodipine and  4. Generalized weakness with acute renal failure secondary to poor by mouth intake -IV fluids -Monitor I's and O's -Avoid nephrotoxins  5. Social worker for discharge planning  6. PT evaluation to be done  7. Tobacco abuse counselling done with pt for >4 mins. He tells me he will smoke till he dies!! He is not interested in any smoking cessation measures.  Above was discussed with patient's niece Maralyn SagoSarah. She is a healthcare power of attorney. Patient is a full code.     All the records are reviewed and case discussed with ED provider. Management plans discussed with the patient, family and they are in agreement.  CODE STATUS: Full  TOTAL TIME TAKING CARE OF THIS PATIENT: 50 minutes.    Jacob Villanueva M.D on 02/14/2017 at 12:23 PM  Between 7am to 6pm - Pager - [phone]  After 6pm go to [email or web address] - password [email or web address] [facility]  [facility]  Office  [phone]  CC: Primary care physician; Patient, No Pcp Per
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
## 2017-02-14 NOTE — Care Management (Signed)
RNCM spoke with proclaimed HCPOA and niece Lamar SprinklesSarah Douglas regarding the need for updated health care power of attorney. This copy (from 2015) scanned in his record (01/2017) did not have patient's signature nor did it have witnesses that patient assigned her as proxy.  It only has Maralyn SagoSarah notarized signature- which is not necessary. Maralyn SagoSarah states that her mother has another copy and she will bring that in. I explained that patient can not legally assign a health care proxy unless he has capacity and he clearly does not this visit. Maralyn SagoSarah notified that if the other copy does not have patient's signature with witnesses then she and her mother may need to seek guardianship through the court system.

## 2017-02-14 NOTE — ED Triage Notes (Signed)
Pt via em[facility] with AMS, diaphoresis. [facility] baseline is altered due to hx of TBI. Pt admitted here last Thursday for sepsis, released yesterday. Pt sweaty upon arrival, asking for his niece. Pt 88% on RA, placed on 2L oxygen; increased sat to 92%. Pt continually asking for his sweatshirt after niece arrived.

## 2017-02-14 NOTE — ED Notes (Signed)
Ems inserted a 20g and 18g iv; gave 200 cc NS

## 2017-02-15 LAB — URINALYSIS, ROUTINE W REFLEX MICROSCOPIC
Bilirubin Urine: NEGATIVE
Glucose, UA: NEGATIVE mg/dL
Hgb urine dipstick: NEGATIVE
KETONES UR: NEGATIVE mg/dL
LEUKOCYTES UA: NEGATIVE
NITRITE: NEGATIVE
PROTEIN: NEGATIVE mg/dL
Specific Gravity, Urine: 1.003 — ABNORMAL LOW (ref 1.005–1.030)
pH: 6 (ref 5.0–8.0)

## 2017-02-15 LAB — BASIC METABOLIC PANEL
Anion gap: 5 (ref 5–15)
BUN: 11 mg/dL (ref 6–20)
CALCIUM: 8.3 mg/dL — AB (ref 8.9–10.3)
CHLORIDE: 109 mmol/L (ref 101–111)
CO2: 26 mmol/L (ref 22–32)
CREATININE: 0.7 mg/dL (ref 0.61–1.24)
GFR calc Af Amer: >60 mL/min (ref >60)
GFR calc non Af Amer: >60 mL/min (ref >60)
GLUCOSE: 94 mg/dL (ref 65–99)
Potassium: 3.7 mmol/L (ref 3.5–5.1)
Sodium: 140 mmol/L (ref 135–145)

## 2017-02-15 NOTE — Clinical Social Work Note (Signed)
Clinical Social Work Assessment  Patient Details  Name: Jacob Villanueva MRN: [id number] Date of Birth: 02-Jan-1951  Date of referral:  02/15/17               Reason for consult:  Discharge Planning                Permission sought to share information with:    Permission granted to share information::     Name::        Agency::     Relationship::     Contact Information:     Housing/Transportation Living arrangements for the past 2 months:  [place] of Information:  Patient Patient Interpreter Needed:  None Criminal Activity/Legal Involvement Pertinent to Current Situation/Hospitalization:  No - Comment as needed Significant Relationships:  Other Family Members Lives with:  Self Do you feel safe going back to the place where you live?  Yes Need for family participation in patient care:  Yes (Comment)  Care giving concerns:  Patient [facility] at [facility] in [place].    Social Worker assessment / plan:  Holiday representative (CSW) received consult for discharge planning. PT is recommending patient return to independent living. RN case manager aware of above. CSW met with patient alone at bedside to address consult. Patient was alert and oriented X4 and was sitting up in the bed. Patient reported that he just worked with PT and walked around the nurses station. Patient reported that he lives at [facility] independent living and plans on returning there. Per patient he doesn't drive. Patient reported that his niece and sister provide transport. Patient reported no other needs or concerns at this time. Please reconsult if future social work needs arise. CSW signing off.   Employment status:  Disabled (Comment on whether or not currently receiving Disability), Retired Nurse, adult PT Recommendations:  Home with [place] / Referral to community resources:  Other (Comment Required) (Patient will D/C back to  indpendent living. )  Patient/Family's Response to care:  Patient reported no needs.   Patient/Family's Understanding of and Emotional Response to Diagnosis, Current Treatment, and Prognosis:  Patient was pleasant and thanked CSW for visit.   Emotional Assessment Appearance:  Appears stated age Attitude/Demeanor/Rapport:    Affect (typically observed):  Accepting, Adaptable, Pleasant Orientation:  Oriented to Self, Oriented to Place, Oriented to  Time, Oriented to Situation Alcohol / Substance use:  Not Applicable Psych involvement (Current and /or in the community):  No (Comment)  Discharge Needs  Concerns to be addressed:  Discharge Planning Concerns Readmission within the last 30 days:  No Current discharge risk:  Chronically ill Barriers to Discharge:  Continued Medical Work up   [employer], Veronia Beets, LCSW 02/15/2017, 2:05 PM

## 2017-02-15 NOTE — Care Management Obs Status (Signed)
MEDICARE OBSERVATION STATUS NOTIFICATION   Patient Details  Name: Jacob Villanueva MRN: [id number] Date of Birth: 03/08/1951   Medicare Observation Status Notification Given:  Yes    Gwenette GreetBrenda S Ajanae Virag, RN 02/15/2017, 12:54 PM

## 2017-02-15 NOTE — Progress Notes (Signed)
Nutrition Brief Note  Patient identified on the Malnutrition Screening Tool (MST) Report  Wt Readings from Last 15 Encounters:  02/15/17 201 lb (91.2 kg)  02/09/17 195 lb (88.5 kg)  01/19/17 195 lb 12.3 oz (88.8 kg)   66 y.o. male with a known history of COPD with ongoing  heavy tobacco abuse, hypertension, traumatic brain injury who was just discharged yesterday. This is patient's fourth admission in the last 1 month for similar problems. Patient went home yesterday to [facility] and according to the niece Maralyn SagoSarah was present in the ER he smoked a lot. He did not eat breakfast this morning. He was a bit disoriented per niece. He was weak and did not drink any fluids other than 2 bottles of Sprite. Patient was found to be hypoxic with sats 80% on room air by EMS brought to the emergency room.  Body mass index is 24.47 kg/m. Patient meets criteria for normal weight based on current BMI. Per chart, pt is weight stable.   Current diet order is regular, patient is consuming approximately 100% of meals at this time. Labs and medications reviewed.   No nutrition interventions warranted at this time. If nutrition issues arise, please consult RD.   Betsey Holidayasey Arwilda Georgia MS, RD, LDN Pager #- [phone] After Hours Pager: [phone]

## 2017-02-15 NOTE — Progress Notes (Signed)
Subjective: Patient says his readmission had nothing to do with his smoking. He said he had nausea and vomiting however none of those documented anywhere. Currently he has no complaints.  Objective: Vital signs in last 24 hours: Temp:  [97.9 F (36.6 C)-98.8 F (37.1 C)] 97.9 F (36.6 C) (07/04 0458) Pulse Rate:  [73-103] 77 (07/04 0458) Resp:  [17-22] 18 (07/04 0458) BP: (120-141)/(69-92) 122/69 (07/04 0458) SpO2:  [95 %-100 %] 98 % (07/04 0808) Weight change:  Last BM Date: 02/15/17  Intake/Output from previous day: 07/03 0701 - 07/04 0700 In: 2100 [I.V.:2000; IV Piggyback:100] Out: 800 [Urine:800] Intake/Output this shift: Total I/O In: 954 [P.O.:954] Out: 300 [Urine:300]  General appearance: no distress Resp: clear to auscultation bilaterally and normal percussion bilaterally Cardio: regular rate and rhythm, S1, S2 normal, no murmur, click, rub or gallop GI: soft, non-tender; bowel sounds normal; no masses,  no organomegaly  Lab Results:  Recent Labs  02/13/17 0455 02/14/17 1100  WBC 7.4 12.8*  HGB 13.5 14.6  HCT 38.2* 41.6  PLT 250 275   BMET  Recent Labs  02/14/17 1100 02/15/17 0611  NA 142 140  K 3.8 3.7  CL 108 109  CO2 24 26  GLUCOSE 104* 94  BUN 18 11  CREATININE 1.69* 0.70  CALCIUM 9.6 8.3*    Studies/Results: Dg Chest Portable 1 View  Result Date: 02/14/2017 CLINICAL DATA:  Shortness of breath . EXAM: PORTABLE CHEST 1 VIEW COMPARISON:  02/09/2017 .  CT 02/09/2017. FINDINGS: Mediastinum and hilar structures are normal. Low lung volumes with persistent bibasilar atelectasis and/or mild infiltrates. Similar findings noted on prior is. Component of scarring may be present. No pleural effusion or pneumothorax. Heart size stable. IMPRESSION: Low lung volumes with persistent bibasilar atelectasis and/or mild infiltrates. Component of scarring may be present. Similar findings noted on prior studies. Electronically Signed   By: Maisie Fushomas  Register   On:  02/14/2017 11:35    Medications: I have reviewed the patient's current medications.  Assessment/Plan: 1. Acute respiratory failure. Patient was hypoxic on admission. Likely caused by ongoing tobacco abuse. Since admission patient's been weaned off oxygen and is satting in the mid 90s on room air. 2. Pneumonia. Patient was just discharged with that diagnosis. Chest x-ray was unchanged on this admission. Go ahead and finish out his by mouth course of Levaquin that he was discharged on. 3. Hypertension. Continue current medications 4. Disposition. Patient does not qualify for SNIF and does not qualify for oxygen. Obviously is having difficulty taking care of himself at home. Have asked social worker get involved for options.  Total time spent 20 minutes  LOS: 1 day   Gracelyn NurseJohnston,  Noelle Hoogland D 02/15/2017, 11:24 AM

## 2017-02-15 NOTE — Plan of Care (Signed)
Problem: Fluid Volume: Goal: Ability to maintain a balanced intake and output will improve Outcome: Progressing Pt drinks a lot of [place] lemon lime soda

## 2017-02-15 NOTE — Plan of Care (Signed)
Problem: Safety: Goal: Ability to remain free from injury will improve Outcome: Not Progressing Pt refuses to call out for assistance oob/chair to bathroom; staff alerted with alarms sounding to the pt's room

## 2017-02-15 NOTE — Evaluation (Signed)
Physical Therapy Evaluation Patient Details Name: Jacob Villanueva MRN: [id number] DOB: 03/09/1951 Today's Date: 02/15/2017   History of Present Illness  Pt is a 66 y.o. male presenting to hospital after being found by EMS hypoxic at 80%.  Pt with recent admits for PNA and sepsis.  Pt admitted with acute on chronic hypoxic respiratory failure secondary COPD exacerbation; also ongoing PNA treatment.  PMH includes h/o TBI, htn, h/o fx surgery, appendectomy, COPD, heavy tobacco abuse.  Clinical Impression  Prior to hospital admission, pt reports being independent.  [facility]  Currently pt is modified independent with bed mobility; SBA with transfers; and CGA with ambulation 2 laps around nursing station (no assistive device).  Pt demonstrating increased BOS and B lateral sway (with ambulation) but no overt loss of balance noted with ambulation and head turns R/L/up/down, increasing/decreasing speed, and turning and stopping.  Pt does have h/o recent falls.  Pt's O2 93% or greater on room air during session.  Pt would benefit from skilled PT to address noted impairments and functional limitations (see below for any additional details).  Upon hospital discharge, recommend pt discharge back to facility with physical therapy services for higher level balance.    Follow Up Recommendations  (Physical Therapy at Independent Living)    Equipment Recommendations  None recommended by PT    Recommendations for Other Services       Precautions / Restrictions Precautions Precautions: Fall Restrictions Weight Bearing Restrictions: No      Mobility  Bed Mobility Overal bed mobility: Modified Independent Bed Mobility: Supine to Sit;Sit to Supine     Supine to sit: Modified independent (Device/Increase time);HOB elevated Sit to supine: Modified independent (Device/Increase time);HOB elevated   General bed mobility comments: no difficulties noted  Transfers Overall transfer  level: Needs assistance Equipment used: None Transfers: Sit to/from Stand Sit to Stand: Supervision         General transfer comment: SBA for safety; pt pushing LE's against bed and chair to stand  Ambulation/Gait Ambulation/Gait assistance: Min guard Ambulation Distance (Feet): 350 Feet Assistive device: None   Gait velocity: decreased   General Gait Details: Wide BOS; increased B lateral sway; decreased B DF  Stairs            Wheelchair Mobility    Modified Rankin (Stroke Patients Only)       Balance Overall balance assessment: Needs assistance Sitting-balance support: No upper extremity supported;Feet supported Sitting balance-Leahy Scale: Good Sitting balance - Comments: sitting reaching within BOS   Standing balance support: No upper extremity supported Standing balance-Leahy Scale: Fair Standing balance comment: standing to urinate steady                             Pertinent Vitals/Pain Pain Assessment: 0-10 Pain Score: 5  Pain Location: low back pain Pain Descriptors / Indicators: Sore Pain Intervention(s): Limited activity within patient's tolerance;Monitored during session;Repositioned  Vitals (HR and O2 on room air) stable and WFL throughout treatment session.    Home Living Family/patient expects to be discharged to:: Other (Comment)                 [facility]    Prior Function Level of Independence: Independent         Comments: Pt reports being independent.  Pt reports multiple recent falls.     Hand Dominance  Extremity/Trunk Assessment   Upper Extremity Assessment Upper Extremity Assessment: Generalized weakness    Lower Extremity Assessment Lower Extremity Assessment: Generalized weakness    Cervical / Trunk Assessment Cervical / Trunk Assessment: Normal  Communication   Communication: No difficulties  Cognition Arousal/Alertness:  Awake/alert Behavior During Therapy: Impulsive Overall Cognitive Status:  (Oriented to person and place and situation)                                 General Comments: H/o TBI      General Comments General comments (skin integrity, edema, etc.): Pt getting out of bed to urinate upon PT arrival.  Nursing cleared pt for participation in physical therapy.  Pt agreeable to PT session.    Exercises     Assessment/Plan    PT Assessment Patient needs continued PT services  PT Problem List Decreased strength;Decreased balance;Decreased mobility;Decreased safety awareness       PT Treatment Interventions DME instruction;Gait training;Functional mobility training;Therapeutic activities;Therapeutic exercise;Balance training;Patient/family education;Neuromuscular re-education    PT Goals (Current goals can be found in the Care Plan section)  Acute Rehab PT Goals Patient Stated Goal: "I want to go back to bed" PT Goal Formulation: With patient Time For Goal Achievement: 03/01/17 Potential to Achieve Goals: Good    Frequency Min 2X/week   Barriers to discharge        Co-evaluation               AM-PAC PT "6 Clicks" Daily Activity  Outcome Measure Difficulty turning over in bed (including adjusting bedclothes, sheets and blankets)?: None Difficulty moving from lying on back to sitting on the side of the bed? : None Difficulty sitting down on and standing up from a chair with arms (e.g., wheelchair, bedside commode, etc,.)?: A Little Help needed moving to and from a bed to chair (including a wheelchair)?: A Little Help needed walking in hospital room?: A Little Help needed climbing 3-5 steps with a railing? : A Little 6 Click Score: 20    End of Session Equipment Utilized During Treatment: Gait belt Activity Tolerance: Patient tolerated treatment well Patient left: in bed;with call bell/phone within reach;with bed alarm set Nurse Communication: Mobility  status;Precautions PT Visit Diagnosis: Unsteadiness on feet (R26.81);Muscle weakness (generalized) (M62.81);Difficulty in walking, not elsewhere classified (R26.2)    Time: 4098-1191 PT Time Calculation (min) (ACUTE ONLY): 20 min   Charges:   PT Evaluation $PT Eval Low Complexity: 1 Procedure     PT G CodesHendricks Limes, PT 02/15/17, 10:28 AM [phone]

## 2017-02-16 NOTE — Care Management (Signed)
Discharge to home today per Dr. Letitia LibraJohnston. Niece, Lamar SprinklesSarah Douglas, [phone]) is planning on helping Mr. Josey for a few days. Family will transport Gwenette GreetBrenda S Emalene Welte RN MSN CCM Care Management [phone]

## 2017-02-16 NOTE — Progress Notes (Signed)
Discharge paperwork reviewed with patient and niece/POA Maralyn SagoSarah. No medication changes. Patient is stable and ready for [facility]. Sarah to transport back.

## 2017-02-16 NOTE — Discharge Summary (Signed)
Physician Discharge Summary  Patient ID: Jacob Villanueva MRN: [id number] DOB/AGE: 68/09/1950 68 y.o.  Admit date: 02/14/2017 Discharge date: 02/16/2017  Admission Diagnoses:1. Acute restaurant failure 2. Pneumonia  Discharge Diagnoses:  Active Problems:   Acute on chronic respiratory failure with hypoxia (HCC) Pneumonia  Discharged Condition: stable  Hospital Course: 1. Acute respiratory failure. Patient was hypoxic on admission. Likely caused by ongoing tobacco abuse. Since admission patient's been weaned off oxygen and is satting in the mid 90s on room air. Had long discussion with family and patient about smoking cessation. 2. Pneumonia. Patient was just discharged with that diagnosis on last admission.. Chest x-ray was unchanged on this admission. Go ahead and finish out his by mouth course of Levaquin that he was discharged on. 3. Hypertension. Continue current medications 4. Disposition. Patient does not qualify for SNIF and does not qualify for oxygen. Obviously is having difficulty taking care of himself at home. Have asked social worker get involved for options   Discharge Exam: Blood pressure 134/82, pulse 67, temperature 98.1 F (36.7 C), temperature source Oral, resp. rate 16, height 6\' 4"  (1.93 m), weight 91.2 kg (201 lb), SpO2 95 %. General appearance: no distress Resp: clear to auscultation bilaterally  Disposition: 01-Home or Self Care  Discharge Instructions    Diet - low sodium heart healthy    Complete by:  As directed    Increase activity slowly    Complete by:  As directed      Allergies as of 02/16/2017      Reactions   Clindamycin/lincomycin Diarrhea   Effexor [venlafaxine Hcl]    DELUSIONAL   Amoxicillin Rash      Medication List    TAKE these medications   allopurinol 100 MG tablet Commonly known as:  ZYLOPRIM Take 100 mg by mouth daily.   amLODipine 5 MG tablet Commonly known as:  NORVASC Take 1 tablet (5 mg total) by mouth daily.   calcium  carbonate 750 MG chewable tablet Commonly known as:  TUMS EX Chew 1 tablet by mouth as needed.   divalproex 125 MG capsule Commonly known as:  DEPAKOTE SPRINKLE Take 125 mg by mouth 2 (two) times daily as needed.   esomeprazole 40 MG capsule Commonly known as:  NEXIUM Take 40 mg by mouth 2 (two) times daily before a meal.   gabapentin 300 MG capsule Commonly known as:  NEURONTIN Take 1 capsule (300 mg total) by mouth 2 (two) times daily.   ipratropium-albuterol 0.5-2.5 (3) MG/3ML Soln Commonly known as:  DUONEB Take 3 mLs by nebulization 3 (three) times daily.   levofloxacin 750 MG tablet Commonly known as:  LEVAQUIN Take 1 tablet (750 mg total) by mouth daily.   magnesium oxide 400 (241.3 Mg) MG tablet Commonly known as:  MAG-OX Take 1 tablet (400 mg total) by mouth 2 (two) times daily.   metoCLOPramide 5 MG tablet Commonly known as:  REGLAN Take 5 mg by mouth 3 (three) times daily with meals as needed.   NON FORMULARY 1 Dose every 3 (three) months. Testa Pill. A testosterone pill that is implanted into side q 3 months   ranitidine 150 MG tablet Commonly known as:  ZANTAC Take 150 mg by mouth at bedtime.   sertraline 50 MG tablet Commonly known as:  ZOLOFT Take 50 mg by mouth every morning.        Signed: Gracelyn NurseJohnston,  [name] D 02/16/2017, 9:00 AM

## 2017-02-19 LAB — CULTURE, BLOOD (ROUTINE X 2)
CULTURE: NO GROWTH
Culture: NO GROWTH
Special Requests: ADEQUATE
Special Requests: ADEQUATE

## 2017-02-22 NOTE — Progress Notes (Signed)
   02/15/17 1024  PT Time Calculation  PT Start Time (ACUTE ONLY) 0845  PT Stop Time (ACUTE ONLY) 0905  PT Time Calculation (min) (ACUTE ONLY) 20 min  PT G-Codes **NOT FOR INPATIENT CLASS**  Functional Assessment Tool Used AM-PAC 6 Clicks Basic Mobility  Functional Limitation Mobility: Walking and moving around  Mobility: Walking and Moving Around Current Status (Z6109(G8978) CJ  Mobility: Walking and Moving Around Goal Status (U0454(G8979) CH  PT General Charges  $$ ACUTE PT VISIT 1 Procedure  PT Evaluation  $PT Eval Low Complexity 1 Procedure   Late entry G-codes entered after review of initial documentation.  Hendricks LimesEmily Chiyoko Torrico, PT 02/22/17, 10:42 AM [phone]

## 2018-06-07 ENCOUNTER — Other Ambulatory Visit: Payer: Self-pay

## 2018-06-07 ENCOUNTER — Encounter
Admission: EM | Disposition: A | Discharge status: Skilled Nursing Facility | Payer: Self-pay | Source: Home / Self Care | Attending: Internal Medicine

## 2018-06-07 ENCOUNTER — Inpatient Hospital Stay: Payer: Medicare Other | Admitting: Anesthesiology

## 2018-06-07 ENCOUNTER — Emergency Department: Payer: Medicare Other

## 2018-06-07 ENCOUNTER — Inpatient Hospital Stay
Admission: EM | Admit: 2018-06-07 | Discharge: 2018-06-10 | DRG: 480 | Disposition: A | Discharge status: Skilled Nursing Facility | Payer: Medicare Other | Attending: Internal Medicine | Admitting: Internal Medicine

## 2018-06-07 ENCOUNTER — Inpatient Hospital Stay: Payer: Medicare Other

## 2018-06-07 DIAGNOSIS — S0003XA Contusion of scalp, initial encounter: Secondary | ICD-10-CM | POA: Diagnosis present

## 2018-06-07 DIAGNOSIS — Z87820 Personal history of traumatic brain injury: Secondary | ICD-10-CM | POA: Diagnosis not present

## 2018-06-07 DIAGNOSIS — F17210 Nicotine dependence, cigarettes, uncomplicated: Secondary | ICD-10-CM | POA: Diagnosis present

## 2018-06-07 DIAGNOSIS — J189 Pneumonia, unspecified organism: Secondary | ICD-10-CM | POA: Diagnosis present

## 2018-06-07 DIAGNOSIS — W19XXXA Unspecified fall, initial encounter: Secondary | ICD-10-CM | POA: Diagnosis present

## 2018-06-07 DIAGNOSIS — S72142A Displaced intertrochanteric fracture of left femur, initial encounter for closed fracture: Principal | ICD-10-CM | POA: Diagnosis present

## 2018-06-07 DIAGNOSIS — F10120 Alcohol abuse with intoxication, uncomplicated: Secondary | ICD-10-CM | POA: Diagnosis present

## 2018-06-07 DIAGNOSIS — I959 Hypotension, unspecified: Secondary | ICD-10-CM | POA: Diagnosis present

## 2018-06-07 DIAGNOSIS — J440 Chronic obstructive pulmonary disease with acute lower respiratory infection: Secondary | ICD-10-CM | POA: Diagnosis present

## 2018-06-07 DIAGNOSIS — Z888 Allergy status to other drugs, medicaments and biological substances status: Secondary | ICD-10-CM | POA: Diagnosis not present

## 2018-06-07 DIAGNOSIS — I10 Essential (primary) hypertension: Secondary | ICD-10-CM | POA: Diagnosis present

## 2018-06-07 DIAGNOSIS — Z419 Encounter for procedure for purposes other than remedying health state, unspecified: Secondary | ICD-10-CM | POA: Diagnosis present

## 2018-06-07 DIAGNOSIS — S72002A Fracture of unspecified part of neck of left femur, initial encounter for closed fracture: Secondary | ICD-10-CM | POA: Diagnosis present

## 2018-06-07 DIAGNOSIS — Z881 Allergy status to other antibiotic agents status: Secondary | ICD-10-CM

## 2018-06-07 DIAGNOSIS — D72829 Elevated white blood cell count, unspecified: Secondary | ICD-10-CM

## 2018-06-07 HISTORY — PX: INTRAMEDULLARY (IM) NAIL INTERTROCHANTERIC: SHX5875

## 2018-06-07 LAB — CBC
HEMATOCRIT: 41.4 % (ref 39.0–52.0)
Hemoglobin: 13.8 g/dL (ref 13.0–17.0)
MCH: 31.7 pg (ref 26.0–34.0)
MCHC: 33.3 g/dL (ref 30.0–36.0)
MCV: 95.2 fL (ref 80.0–100.0)
Platelets: 272 10*3/uL (ref 150–400)
RBC: 4.35 MIL/uL (ref 4.22–5.81)
RDW: 12.2 % (ref 11.5–15.5)
WBC: 16.6 10*3/uL — AB (ref 4.0–10.5)
nRBC: 0 % (ref 0.0–0.2)

## 2018-06-07 LAB — TYPE AND SCREEN
ABO/RH(D): B POS
Antibody Screen: NEGATIVE

## 2018-06-07 LAB — URINALYSIS, COMPLETE (UACMP) WITH MICROSCOPIC
BACTERIA UA: NONE SEEN
Bilirubin Urine: NEGATIVE
Glucose, UA: NEGATIVE mg/dL
Hgb urine dipstick: NEGATIVE
Ketones, ur: NEGATIVE mg/dL
Leukocytes, UA: NEGATIVE
Nitrite: NEGATIVE
PROTEIN: NEGATIVE mg/dL
Specific Gravity, Urine: 1.021 (ref 1.005–1.030)
pH: 5 (ref 5.0–8.0)

## 2018-06-07 LAB — BASIC METABOLIC PANEL
Anion gap: 9 (ref 5–15)
BUN: 13 mg/dL (ref 8–23)
CHLORIDE: 103 mmol/L (ref 98–111)
CO2: 26 mmol/L (ref 22–32)
CREATININE: 0.89 mg/dL (ref 0.61–1.24)
Calcium: 9.3 mg/dL (ref 8.9–10.3)
GFR calc Af Amer: >60 mL/min (ref >60)
GFR calc non Af Amer: >60 mL/min (ref >60)
Glucose, Bld: 107 mg/dL — ABNORMAL HIGH (ref 70–99)
Potassium: 4.3 mmol/L (ref 3.5–5.1)
SODIUM: 138 mmol/L (ref 135–145)

## 2018-06-07 LAB — APTT: APTT: 25 s (ref 24–36)

## 2018-06-07 SURGERY — FIXATION, FRACTURE, INTERTROCHANTERIC, WITH INTRAMEDULLARY ROD
Anesthesia: Spinal | Laterality: Left

## 2018-06-07 MED ORDER — EPHEDRINE SULFATE 50 MG/ML IJ SOLN
INTRAMUSCULAR | Status: DC | PRN
  Administered 2018-06-07: 10 mg via INTRAVENOUS

## 2018-06-07 MED ORDER — ENOXAPARIN SODIUM 40 MG/0.4ML ~~LOC~~ SOLN: 40.0000 mg | SUBCUTANEOUS | Status: DC

## 2018-06-07 MED ORDER — CEFAZOLIN SODIUM-DEXTROSE 2-4 GM/100ML-% IV SOLN
2.0000 g | Freq: Four times a day (QID) | INTRAVENOUS | Status: AC
  Administered 2018-06-07 – 2018-06-08 (×3): 2 g via INTRAVENOUS
  Filled 2018-06-07 (×4): qty 100

## 2018-06-07 MED ORDER — FENTANYL CITRATE (PF) 100 MCG/2ML IJ SOLN
50.0000 ug | Freq: Once | INTRAMUSCULAR | Status: AC
  Administered 2018-06-07: 50 ug via INTRAVENOUS

## 2018-06-07 MED ORDER — LIDOCAINE HCL (CARDIAC) PF 100 MG/5ML IV SOSY
PREFILLED_SYRINGE | INTRAVENOUS | Status: DC | PRN
  Administered 2018-06-07: 50 mg via INTRAVENOUS

## 2018-06-07 MED ORDER — MORPHINE SULFATE (PF) 2 MG/ML IV SOLN
INTRAVENOUS | Status: AC
  Filled 2018-06-07: qty 1

## 2018-06-07 MED ORDER — TRAMADOL HCL 50 MG PO TABS
50.0000 mg | ORAL_TABLET | Freq: Four times a day (QID) | ORAL | Status: DC
  Administered 2018-06-07 – 2018-06-10 (×10): 50 mg via ORAL
  Filled 2018-06-07 (×10): qty 1

## 2018-06-07 MED ORDER — MIDAZOLAM HCL 2 MG/2ML IJ SOLN
INTRAMUSCULAR | Status: AC
  Filled 2018-06-07: qty 2

## 2018-06-07 MED ORDER — ONDANSETRON HCL 4 MG PO TABS: 4.0000 mg | ORAL_TABLET | Freq: Four times a day (QID) | ORAL | Status: DC | PRN

## 2018-06-07 MED ORDER — MORPHINE SULFATE (PF) 4 MG/ML IV SOLN: 0.5000 mg | INTRAVENOUS | Status: DC | PRN

## 2018-06-07 MED ORDER — ALLOPURINOL 100 MG PO TABS
100.0000 mg | ORAL_TABLET | Freq: Every day | ORAL | Status: DC
  Filled 2018-06-07: qty 1

## 2018-06-07 MED ORDER — FENTANYL CITRATE (PF) 100 MCG/2ML IJ SOLN
INTRAMUSCULAR | Status: DC | PRN
  Administered 2018-06-07: 50 ug via INTRAVENOUS

## 2018-06-07 MED ORDER — ACETAMINOPHEN 325 MG PO TABS: 650.0000 mg | ORAL_TABLET | Freq: Four times a day (QID) | ORAL | Status: DC | PRN

## 2018-06-07 MED ORDER — ZOLPIDEM TARTRATE 5 MG PO TABS: 5.0000 mg | ORAL_TABLET | Freq: Every evening | ORAL | Status: DC | PRN

## 2018-06-07 MED ORDER — BISACODYL 5 MG PO TBEC
5.0000 mg | DELAYED_RELEASE_TABLET | Freq: Every day | ORAL | Status: DC | PRN
  Administered 2018-06-09: 5 mg via ORAL
  Filled 2018-06-07: qty 1

## 2018-06-07 MED ORDER — FENTANYL CITRATE (PF) 100 MCG/2ML IJ SOLN
50.0000 ug | Freq: Once | INTRAMUSCULAR | Status: AC
  Administered 2018-06-07: 50 ug via INTRAVENOUS
  Filled 2018-06-07: qty 2

## 2018-06-07 MED ORDER — MORPHINE SULFATE (PF) 2 MG/ML IV SOLN
2.0000 mg | INTRAVENOUS | Status: DC | PRN
  Administered 2018-06-07: 2 mg via INTRAVENOUS

## 2018-06-07 MED ORDER — SODIUM CHLORIDE 0.9 % IV SOLN
INTRAVENOUS | Status: DC
  Administered 2018-06-07: 19:00:00 via INTRAVENOUS

## 2018-06-07 MED ORDER — PROPOFOL 10 MG/ML IV BOLUS
INTRAVENOUS | Status: DC | PRN
  Administered 2018-06-07: 50 mg via INTRAVENOUS

## 2018-06-07 MED ORDER — DIVALPROEX SODIUM 125 MG PO CSDR
125.0000 mg | DELAYED_RELEASE_CAPSULE | Freq: Two times a day (BID) | ORAL | Status: DC
  Filled 2018-06-07: qty 1

## 2018-06-07 MED ORDER — ALUM & MAG HYDROXIDE-SIMETH 200-200-20 MG/5ML PO SUSP: 30.0000 mL | ORAL | Status: DC | PRN

## 2018-06-07 MED ORDER — DOCUSATE SODIUM 100 MG PO CAPS
100.0000 mg | ORAL_CAPSULE | Freq: Two times a day (BID) | ORAL | Status: DC
  Administered 2018-06-08 – 2018-06-10 (×5): 100 mg via ORAL
  Filled 2018-06-07 (×5): qty 1

## 2018-06-07 MED ORDER — ACETAMINOPHEN 500 MG PO TABS
500.0000 mg | ORAL_TABLET | Freq: Four times a day (QID) | ORAL | Status: AC
  Administered 2018-06-07 – 2018-06-08 (×4): 500 mg via ORAL
  Filled 2018-06-07 (×4): qty 1

## 2018-06-07 MED ORDER — METOCLOPRAMIDE HCL 5 MG/ML IJ SOLN: 5.0000 mg | Freq: Three times a day (TID) | INTRAMUSCULAR | Status: DC | PRN

## 2018-06-07 MED ORDER — SERTRALINE HCL 50 MG PO TABS
50.0000 mg | ORAL_TABLET | Freq: Every morning | ORAL | Status: DC
  Administered 2018-06-08 – 2018-06-10 (×3): 50 mg via ORAL
  Filled 2018-06-07 (×3): qty 1

## 2018-06-07 MED ORDER — PROPOFOL 500 MG/50ML IV EMUL
INTRAVENOUS | Status: DC | PRN
  Administered 2018-06-07: 75 ug/kg/min via INTRAVENOUS

## 2018-06-07 MED ORDER — LEVOFLOXACIN IN D5W 500 MG/100ML IV SOLN
500.0000 mg | INTRAVENOUS | Status: DC
  Administered 2018-06-07: 500 mg via INTRAVENOUS
  Filled 2018-06-07 (×2): qty 100

## 2018-06-07 MED ORDER — GABAPENTIN 300 MG PO CAPS: 300.0000 mg | ORAL_CAPSULE | Freq: Three times a day (TID) | ORAL | Status: DC

## 2018-06-07 MED ORDER — LACTATED RINGERS IV SOLN
INTRAVENOUS | Status: DC | PRN
  Administered 2018-06-07: 13:00:00 via INTRAVENOUS

## 2018-06-07 MED ORDER — METOCLOPRAMIDE HCL 10 MG PO TABS: 5.0000 mg | ORAL_TABLET | Freq: Three times a day (TID) | ORAL | Status: DC | PRN

## 2018-06-07 MED ORDER — DOCUSATE SODIUM 100 MG PO CAPS: 100.0000 mg | ORAL_CAPSULE | Freq: Two times a day (BID) | ORAL | Status: DC

## 2018-06-07 MED ORDER — HYDROMORPHONE HCL 1 MG/ML IJ SOLN: 0.2500 mg | INTRAMUSCULAR | Status: DC | PRN

## 2018-06-07 MED ORDER — CEFAZOLIN (ANCEF) 1 G IV SOLR
2.0000 g | INTRAVENOUS | Status: AC
  Administered 2018-06-07: 2 g

## 2018-06-07 MED ORDER — PHENOL 1.4 % MT LIQD
1.0000 | OROMUCOSAL | Status: DC | PRN
  Filled 2018-06-07: qty 177

## 2018-06-07 MED ORDER — MAGNESIUM CITRATE PO SOLN
1.0000 | Freq: Once | ORAL | Status: DC | PRN
  Filled 2018-06-07: qty 296

## 2018-06-07 MED ORDER — MENTHOL 3 MG MT LOZG
1.0000 | LOZENGE | OROMUCOSAL | Status: DC | PRN
  Filled 2018-06-07: qty 9

## 2018-06-07 MED ORDER — SODIUM CHLORIDE 0.9 % IV SOLN
INTRAVENOUS | Status: DC | PRN
  Administered 2018-06-07: 30 ug/min via INTRAVENOUS

## 2018-06-07 MED ORDER — PHENYLEPHRINE HCL 10 MG/ML IJ SOLN
INTRAMUSCULAR | Status: DC | PRN
  Administered 2018-06-07 (×2): 100 ug via INTRAVENOUS

## 2018-06-07 MED ORDER — METHOCARBAMOL 1000 MG/10ML IJ SOLN
500.0000 mg | Freq: Four times a day (QID) | INTRAVENOUS | Status: DC | PRN
  Filled 2018-06-07: qty 5

## 2018-06-07 MED ORDER — HYDROCODONE-ACETAMINOPHEN 5-325 MG PO TABS
1.0000 | ORAL_TABLET | ORAL | Status: DC | PRN
  Administered 2018-06-08: 1 via ORAL
  Administered 2018-06-08 – 2018-06-09 (×4): 2 via ORAL
  Administered 2018-06-10: 1 via ORAL
  Filled 2018-06-07 (×2): qty 2
  Filled 2018-06-07: qty 1
  Filled 2018-06-07: qty 2
  Filled 2018-06-07: qty 1
  Filled 2018-06-07: qty 2

## 2018-06-07 MED ORDER — FENTANYL CITRATE (PF) 100 MCG/2ML IJ SOLN
INTRAMUSCULAR | Status: AC
  Administered 2018-06-07: 50 ug via INTRAVENOUS
  Filled 2018-06-07: qty 2

## 2018-06-07 MED ORDER — SODIUM CHLORIDE 0.9 % IV BOLUS
500.0000 mL | Freq: Once | INTRAVENOUS | Status: AC
  Administered 2018-06-07: 500 mL via INTRAVENOUS

## 2018-06-07 MED ORDER — ONDANSETRON HCL 4 MG/2ML IJ SOLN
4.0000 mg | Freq: Four times a day (QID) | INTRAMUSCULAR | Status: DC | PRN
  Administered 2018-06-09 – 2018-06-10 (×2): 4 mg via INTRAVENOUS
  Filled 2018-06-07 (×2): qty 2

## 2018-06-07 MED ORDER — METHOCARBAMOL 500 MG PO TABS
500.0000 mg | ORAL_TABLET | Freq: Four times a day (QID) | ORAL | Status: DC | PRN
  Administered 2018-06-08 – 2018-06-09 (×2): 500 mg via ORAL
  Filled 2018-06-07 (×2): qty 1

## 2018-06-07 MED ORDER — MAGNESIUM HYDROXIDE 400 MG/5ML PO SUSP
30.0000 mL | Freq: Every day | ORAL | Status: DC | PRN
  Administered 2018-06-08: 30 mL via ORAL
  Filled 2018-06-07: qty 30

## 2018-06-07 MED ORDER — MORPHINE BOLUS VIA INFUSION: 2.0000 mg | Freq: Once | INTRAVENOUS | Status: DC

## 2018-06-07 MED ORDER — HYDROCODONE-ACETAMINOPHEN 5-325 MG PO TABS: 1.0000 | ORAL_TABLET | ORAL | Status: DC | PRN

## 2018-06-07 MED ORDER — ACETAMINOPHEN 650 MG RE SUPP: 650.0000 mg | Freq: Four times a day (QID) | RECTAL | Status: DC | PRN

## 2018-06-07 MED ORDER — INFLUENZA VAC SPLIT HIGH-DOSE 0.5 ML IM SUSY
0.5000 mL | PREFILLED_SYRINGE | INTRAMUSCULAR | Status: DC
  Filled 2018-06-07 (×2): qty 0.5

## 2018-06-07 MED ORDER — SODIUM CHLORIDE 0.9 % IV SOLN: INTRAVENOUS | Status: DC

## 2018-06-07 MED ORDER — GLYCOPYRROLATE 0.2 MG/ML IJ SOLN
INTRAMUSCULAR | Status: DC | PRN
  Administered 2018-06-07: 0.2 mg via INTRAVENOUS

## 2018-06-07 MED ORDER — FENTANYL CITRATE (PF) 100 MCG/2ML IJ SOLN
INTRAMUSCULAR | Status: AC
  Filled 2018-06-07: qty 2

## 2018-06-07 MED ORDER — TRAZODONE HCL 50 MG PO TABS: 25.0000 mg | ORAL_TABLET | Freq: Every evening | ORAL | Status: DC | PRN

## 2018-06-07 MED ORDER — BISACODYL 10 MG RE SUPP: 10.0000 mg | Freq: Every day | RECTAL | Status: DC | PRN

## 2018-06-07 MED ORDER — PROPOFOL 500 MG/50ML IV EMUL
INTRAVENOUS | Status: AC
  Filled 2018-06-07: qty 50

## 2018-06-07 MED ORDER — ASPIRIN EC 325 MG PO TBEC
325.0000 mg | DELAYED_RELEASE_TABLET | Freq: Every day | ORAL | Status: DC
  Administered 2018-06-08 – 2018-06-10 (×3): 325 mg via ORAL
  Filled 2018-06-07 (×3): qty 1

## 2018-06-07 SURGICAL SUPPLY — 33 items
BIT DRILL 4.3MMS DISTAL GRDTED (BIT) ×1 IMPLANT
CANISTER SUCT 1200ML W/VALVE (MISCELLANEOUS) ×3 IMPLANT
CHLORAPREP W/TINT 26ML (MISCELLANEOUS) ×3 IMPLANT
COVER WAND RF STERILE (DRAPES) ×3 IMPLANT
DRAPE SHEET LG 3/4 BI-LAMINATE (DRAPES) ×3 IMPLANT
DRAPE U-SHAPE 47X51 STRL (DRAPES) ×3 IMPLANT
DRILL 4.3MMS DISTAL GRADUATED (BIT) ×3
DRSG OPSITE POSTOP 3X4 (GAUZE/BANDAGES/DRESSINGS) ×9 IMPLANT
GLOVE BIOGEL PI IND STRL 9 (GLOVE) ×1 IMPLANT
GLOVE BIOGEL PI INDICATOR 9 (GLOVE) ×2
GLOVE SURG SYN 9.0  PF PI (GLOVE) ×2
GLOVE SURG SYN 9.0 PF PI (GLOVE) ×1 IMPLANT
GOWN SRG 2XL LVL 4 RGLN SLV (GOWNS) ×1 IMPLANT
GOWN STRL NON-REIN 2XL LVL4 (GOWNS) ×2
GOWN STRL REUS W/ TWL LRG LVL3 (GOWN DISPOSABLE) ×1 IMPLANT
GOWN STRL REUS W/TWL LRG LVL3 (GOWN DISPOSABLE) ×2
GUIDEPIN VERSANAIL DSP 3.2X444 (ORTHOPEDIC DISPOSABLE SUPPLIES) ×3 IMPLANT
GUIDEWIRE BALL NOSE 100CM (WIRE) ×3 IMPLANT
HFN LH 130 DEG 11MM X 380MM (Orthopedic Implant) ×3 IMPLANT
KIT TURNOVER KIT A (KITS) ×3 IMPLANT
MAT ABSORB  FLUID 56X50 GRAY (MISCELLANEOUS) ×2
MAT ABSORB FLUID 56X50 GRAY (MISCELLANEOUS) ×1 IMPLANT
NEEDLE FILTER BLUNT 18X 1/2SAF (NEEDLE) ×2
NEEDLE FILTER BLUNT 18X1 1/2 (NEEDLE) ×1 IMPLANT
NS IRRIG 500ML POUR BTL (IV SOLUTION) ×3 IMPLANT
PACK HIP COMPR (MISCELLANEOUS) ×3 IMPLANT
SCALPEL PROTECTED #15 DISP (BLADE) ×6 IMPLANT
SCREW BONE CORTICAL 5.0X50 (Screw) ×3 IMPLANT
SCREW LAG 10.5X120MM (Screw) ×3 IMPLANT
STAPLER SKIN PROX 35W (STAPLE) ×3 IMPLANT
SUT VIC AB 1 CT1 36 (SUTURE) ×3 IMPLANT
SUT VIC AB 2-0 CT1 (SUTURE) ×3 IMPLANT
SYR 10ML LL (SYRINGE) ×3 IMPLANT

## 2018-06-07 NOTE — ED Notes (Signed)
Pt given warm blankets. Comfortably resting. Family at bedside.

## 2018-06-07 NOTE — Anesthesia Procedure Notes (Signed)
Performed by: Chanique Duca, CRNA Pre-anesthesia Checklist: Patient identified, Emergency Drugs available, Suction available, Patient being monitored and Timeout performed Oxygen Delivery Method: Nasal cannula       

## 2018-06-07 NOTE — Op Note (Signed)
06/07/2018  3:09 PM  PATIENT:  Jacob Villanueva  67 y.o. male  PRE-OPERATIVE DIAGNOSIS:  Fracture Left Hip, comminuted left intertrochanteric hip fracture  POST-OPERATIVE DIAGNOSIS:  Fracture Left Hip, same PROCEDURE:  Procedure(s): INTRAMEDULLARY (IM) NAIL INTERTROCHANTRIC (Left)  SURGEON: Leitha Schuller, MD  ASSISTANTS: None  ANESTHESIA:   spinal  EBL:  Total I/O In: 500 [I.V.:500] Out: -   BLOOD ADMINISTERED:none  DRAINS: none   LOCAL MEDICATIONS USED:  NONE  SPECIMEN:  No Specimen  DISPOSITION OF SPECIMEN:  N/A  COUNTS:  YES  TOURNIQUET:  * No tourniquets in log *  IMPLANTS: Zimmer long affixes left, 11 x 380 mm with 120 mm leg screw and 50 mm interlocking screw  DICTATION: .Dragon Dictation patient was brought to the operating room and after adequate spinal anesthesia was obtained the patient was transferred to the fracture table where the right leg was the well-leg holder and left leg in the traction boot.  C arm was brought in and good visualization was obtained with traction near anatomic alignment obtained.  After prepping and draping in the usual sterile fashion appropriate patient identification and timeout procedure were completed.  A small incision was made proximal to the greater trochanter and a guidewire inserted followed by proximal reaming and passing of a long guidewire and after checking AP and lateral imaging.  Reaming was carried out measurement made off the guidewire such that a 11 x 380 rod was inserted after reaming to 13 mm.  A small lateral incision was then made going through the drill guide a guidewire was sent placed in a center center position into the head measured drilled and tapped for a 120 mm leg screw.  At this point traction was released on the foot and the compression device used in good compression was obtained to the fracture site.  The locking knot was then set placed in the proximal rod with a quarter turn to allow for further compression.  The  insertion handle was removed from the rod and the distal screw hole filled using standard technique drilling measuring and placing the 5.0 distal cortical screw.  The wounds were irrigated and the deep fascia repaired using #1 Vicryl with 2-0 Vicryl subcutaneously and skin staples Xeroform and honeycomb dressings applied  PLAN OF CARE: Continue as inpatient  PATIENT DISPOSITION:  PACU - hemodynamically stable.

## 2018-06-07 NOTE — ED Notes (Signed)
Pt to OR by OR tech

## 2018-06-07 NOTE — ED Triage Notes (Signed)
Pt brought in via EMS d/t fall. Pt from [facility]. Pt states he got dizzy this morning and fell. Unwitnessed fall caused L hip rotation. 7/10 pain with 50 of fentanyl given by EMS. BP 125/75 per EMS. Afib/dementia symp/allergy to amoxicillin per EMS.

## 2018-06-07 NOTE — Consult Note (Signed)
Reason for consult is left comminuted intertrochanteric hip fracture History: Patient is a 67 year old who lives in assisted living facility has been having recent falls of unknown reason.  He suffered a fall today and was brought to the emergency room and found to have a comminuted left hip fracture.  He is essentially a household ambulator without assistive device  On exam his left leg is shortened and externally rotated with severe pain.  He has market swelling to the thigh but skin is intact without ecchymosis.  Distally he is neurovascular intact with strong dorsalis pedis and posterior tib pulse is able flex extend the toes and has intact sensation.  Radiographs show a comminuted fracture with the lesser trochanter avulsed off unstable intertrochanteric fracture  Impression is unstable left intertrochanteric hip fracture  Since he has not eaten today we can go ahead and fix this to get pain relief and get immobilized quicker.  Risk benefits possible complications were discussed with his sister who is his power of attorney

## 2018-06-07 NOTE — ED Notes (Signed)
Pulse 2+ bilat feet

## 2018-06-07 NOTE — Transfer of Care (Signed)
Immediate Anesthesia Transfer of Care Note  Patient: Jacob Villanueva  Procedure(s) Performed: INTRAMEDULLARY (IM) NAIL INTERTROCHANTRIC (Left )  Patient Location: PACU  Anesthesia Type:Spinal  Level of Consciousness: awake and alert   Airway & Oxygen Therapy: Patient Spontanous Breathing and Patient connected to nasal cannula oxygen  Post-op Assessment: Report given to RN and Post -op Vital signs reviewed and stable  Post vital signs: Reviewed and stable  Last Vitals:  Vitals Value Taken Time  BP 102/82 06/07/2018  3:10 PM  Temp 36.6 C 06/07/2018  3:08 PM  Pulse 76 06/07/2018  3:09 PM  Resp 15 06/07/2018  3:09 PM  SpO2 100 % 06/07/2018  3:09 PM  Vitals shown include unvalidated device data.  Last Pain:  Vitals:   06/07/18 1305  TempSrc: Tympanic  PainSc: 6          Complications: No apparent anesthesia complications

## 2018-06-07 NOTE — Anesthesia Procedure Notes (Signed)
Spinal  Patient location during procedure: OR Start time: 06/07/2018 1:54 PM End time: 06/07/2018 1:55 PM Staffing Anesthesiologist: Jovita Gamma, MD Resident/CRNA: Casey Burkitt, CRNA Performed: resident/CRNA  Preanesthetic Checklist Completed: patient identified, site marked, surgical consent, pre-op evaluation, timeout performed, IV checked, risks and benefits discussed and monitors and equipment checked Spinal Block Patient position: right lateral decubitus Prep: ChloraPrep Patient monitoring: heart rate, continuous pulse ox and blood pressure Approach: midline Location: L3-4 Injection technique: single-shot Needle Needle type: Pencan  Needle gauge: 24 G Needle length: 10 cm Assessment Sensory level: T4

## 2018-06-07 NOTE — H&P (Addendum)
[facility] [place] at [facility]   PATIENT NAME: Jacob Villanueva    MR#:  [id number]  DATE OF BIRTH:  1951/05/08  DATE OF ADMISSION:  06/07/2018  PRIMARY CARE PHYSICIAN: [facility]   REQUESTING/REFERRING PHYSICIAN: Dr. Sharyn Creamer  CHIEF COMPLAINT: Fall   Chief Complaint  Patient presents with  . Fall  . Near Syncope    HISTORY OF PRESENT ILLNESS:  Jacob Villanueva  is a 67 y.o. 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 and has been having left hip pain noted to have left femur fracture.  Has been having left hip pain.  Patient is at [facility] for long time.  According to patient's niece patient is not taking any medicines for high blood pressure or mood for the past 1 to 2 years.  Patient noted to have slight hypotension BP 94/70 5-year.  He denies complaints.  No chest pain but has been having some cough lately.  WBC elevated to 16.6 and chest x-ray showed left base pneumonia.pt felt   Light headed  PAST MEDICAL HISTORY:   Past Medical History:  Diagnosis Date  . Bacteremia 01/2017  . Hypertension   . TBI (traumatic brain injury) (HCC)     PAST SURGICAL HISTOIRY:   Past Surgical History:  Procedure Laterality Date  . APPENDECTOMY    . FRACTURE SURGERY      SOCIAL HISTORY:   Social History   Tobacco Use  . Smoking status: Current Every Day Smoker    Packs/day: 2.00  . Smokeless tobacco: Never Used  Substance Use Topics  . Alcohol use: Yes    FAMILY HISTORY:  History reviewed. No pertinent family history.  DRUG ALLERGIES:   Allergies  Allergen Reactions  . Clindamycin/Lincomycin Diarrhea  . Effexor [Venlafaxine Hcl]     DELUSIONAL   . Amoxicillin Rash    REVIEW OF SYSTEMS:  CONSTITUTIONAL: No fever, fatigue or weakness.  Slow to  answer.  Patient from independent living facility, does not use walker or cane and walks without any help. EYES: No blurred or double vision.  EARS, NOSE, AND THROAT: No tinnitus or ear pain.  RESPIRATORY: Mild cough.  No shortness of breath or wheezing.  CARDIOVASCULAR: No chest pain, orthopnea, edema.  GASTROINTESTINAL: No nausea, vomiting, diarrhea or abdominal pain.  GENITOURINARY: No dysuria, hematuria.  ENDOCRINE: No polyuria, nocturia,  HEMATOLOGY: No anemia, easy bruising or bleeding SKIN: No rash or lesion. MUSCULOSKELETAL: left hip pain nEUROLOGIC: No tingling, numbness, weakness.  PSYCHIATRY: No anxiety or depression.  History of TBI.  MEDICATIONS AT HOME:   Prior to Admission medications   Medication Sig Start Date End Date Taking? Authorizing Provider  allopurinol (ZYLOPRIM) 100 MG tablet Take 100 mg by mouth daily. 01/12/17   [provider]  amLODipine (NORVASC) 5 MG tablet Take 1 tablet (5 mg total) by mouth daily. 01/24/17   Alford Highland, MD  calcium carbonate (TUMS EX) 750 MG chewable tablet Chew 1 tablet by mouth as needed.     [provider]  divalproex (DEPAKOTE SPRINKLE) 125 MG capsule Take 125 mg by mouth 2 (two) times daily as needed. 02/07/17   [provider]  esomeprazole (NEXIUM) 40 MG capsule Take 40 mg by mouth 2 (two) times daily before a meal.     [provider]  gabapentin (NEURONTIN) 300 MG capsule Take 1 capsule (300 mg total) by mouth 2 (two) times daily. 01/24/17   Alford Highland, MD  ipratropium-albuterol (DUONEB) 0.5-2.5 (3) MG/3ML SOLN Take 3 mLs by nebulization 3 (three) times daily. 01/24/17   Alford Highland, MD  levofloxacin (LEVAQUIN) 750 MG tablet Take 1 tablet (750 mg total) by mouth daily. 02/12/17   Gracelyn Nurse, MD  magnesium oxide (MAG-OX) 400 (241.3 Mg) MG tablet Take 1 tablet (400 mg total) by mouth 2 (two) times daily. 01/24/17   Alford Highland, MD  metoCLOPramide (REGLAN) 5 MG tablet Take 5 mg  by mouth 3 (three) times daily with meals as needed. 01/13/17   [provider]  NON FORMULARY 1 Dose every 3 (three) months. Testa Pill. A testosterone pill that is implanted into side q 3 months    [provider]  ranitidine (ZANTAC) 150 MG tablet Take 150 mg by mouth at bedtime.    [provider]  sertraline (ZOLOFT) 50 MG tablet Take 50 mg by mouth every morning. 01/12/17   [provider]      VITAL SIGNS:  Blood pressure 94/75, pulse (!) 56, temperature 98 F (36.7 C), temperature source Oral, resp. rate 15, height 6\' 2"  (1.88 m), weight 90.7 kg, SpO2 98 %.  PHYSICAL EXAMINATION:  GENERAL:  67 y.o.-year-old patient lying in the bed with no acute distress.  EYES: Pupils equal, round, reactive to light and accommodation. No scleral icterus. Extraocular muscles intact.  HEENT: Head atraumatic, normocephalic. Oropharynx and nasopharynx clear.  NECK:  Supple, no jugular venous distention. No thyroid enlargement, no tenderness.  LUNGS: Normal breath sounds bilaterally, no wheezing, rales,rhonchi or crepitation. No use of accessory muscles of respiration.  CARDIOVASCULAR: S1, S2 normal. No murmurs, rubs, or gallops.  ABDOMEN: Soft, nontender, nondistended. Bowel sounds present. No organomegaly or mass.  EXTREMITIES: externatnal rotation and shortened left leg.neuro vascularly intact. NEUROLOGIC: Cranial nerves II through XII are intact. Muscle strength 5/5 in all extremities. Sensation intact. Gait not checked.  PSYCHIATRIC: The patient is alert and oriented x 3.  SKIN: No obvious rash, lesion, or ulcer.   LABORATORY PANEL:   CBC Recent Labs  Lab 06/07/18 0814  WBC 16.6*  HGB 13.8  HCT 41.4  PLT 272   ------------------------------------------------------------------------------------------------------------------  Chemistries  Recent Labs  Lab 06/07/18 0814  NA 138  K 4.3  CL 103  CO2 26  GLUCOSE 107*  BUN 13  CREATININE 0.89  CALCIUM  9.3   ------------------------------------------------------------------------------------------------------------------  Cardiac Enzymes No results for input(s): TROPONINI in the last 168 hours. ------------------------------------------------------------------------------------------------------------------  RADIOLOGY:  Ct Head Wo Contrast  Result Date: 06/07/2018 CLINICAL DATA:  Unwitnessed fall. EXAM: CT HEAD WITHOUT CONTRAST CT CERVICAL SPINE WITHOUT CONTRAST TECHNIQUE: Multidetector CT imaging of the head and cervical spine was performed following the standard protocol without intravenous contrast. Multiplanar CT image reconstructions of the cervical spine were also generated. COMPARISON:  None. FINDINGS: CT HEAD FINDINGS Brain: Mild chronic ischemic white matter disease is noted. No mass effect or midline shift is noted. Ventricular size is within normal limits. There is no evidence of mass lesion, hemorrhage or acute infarction. Vascular: No hyperdense vessel or unexpected calcification. Skull: Normal. Negative for fracture or focal lesion. Sinuses/Orbits: Minimal bilateral ethmoid sinusitis is noted. Other: Small left posterior scalp hematoma is noted. CT CERVICAL SPINE FINDINGS Alignment: Minimal grade 1 anterolisthesis of C4-5 is noted secondary to posterior facet joint hypertrophy. Skull base and vertebrae: No acute fracture. No primary bone lesion  or focal pathologic process. Soft tissues and spinal canal: No prevertebral fluid or swelling. No visible canal hematoma. Disc levels: Moderate degenerative disc disease is noted at C5-6 and C6-7. Upper chest: Negative. Other: Degenerative changes are seen involving the right-sided posterior facet joints. IMPRESSION: Mild chronic ischemic white matter disease. Small left posterior scalp hematoma. No acute intracranial abnormality seen. Moderate multilevel degenerative disc disease. No acute abnormality seen in the cervical spine. Electronically  Signed   By: Lupita Raider, M.D.   On: 06/07/2018 10:02   Ct Cervical Spine Wo Contrast  Result Date: 06/07/2018 CLINICAL DATA:  Unwitnessed fall. EXAM: CT HEAD WITHOUT CONTRAST CT CERVICAL SPINE WITHOUT CONTRAST TECHNIQUE: Multidetector CT imaging of the head and cervical spine was performed following the standard protocol without intravenous contrast. Multiplanar CT image reconstructions of the cervical spine were also generated. COMPARISON:  None. FINDINGS: CT HEAD FINDINGS Brain: Mild chronic ischemic white matter disease is noted. No mass effect or midline shift is noted. Ventricular size is within normal limits. There is no evidence of mass lesion, hemorrhage or acute infarction. Vascular: No hyperdense vessel or unexpected calcification. Skull: Normal. Negative for fracture or focal lesion. Sinuses/Orbits: Minimal bilateral ethmoid sinusitis is noted. Other: Small left posterior scalp hematoma is noted. CT CERVICAL SPINE FINDINGS Alignment: Minimal grade 1 anterolisthesis of C4-5 is noted secondary to posterior facet joint hypertrophy. Skull base and vertebrae: No acute fracture. No primary bone lesion or focal pathologic process. Soft tissues and spinal canal: No prevertebral fluid or swelling. No visible canal hematoma. Disc levels: Moderate degenerative disc disease is noted at C5-6 and C6-7. Upper chest: Negative. Other: Degenerative changes are seen involving the right-sided posterior facet joints. IMPRESSION: Mild chronic ischemic white matter disease. Small left posterior scalp hematoma. No acute intracranial abnormality seen. Moderate multilevel degenerative disc disease. No acute abnormality seen in the cervical spine. Electronically Signed   By: Lupita Raider, M.D.   On: 06/07/2018 10:02   Dg Chest Portable 1 View  Result Date: 06/07/2018 CLINICAL DATA:  Elevated white blood cell count.  Hypertension. EXAM: PORTABLE CHEST 1 VIEW COMPARISON:  February 14, 2017 FINDINGS: There is slight  atelectatic change in the left base. The lungs elsewhere are clear. The heart size and pulmonary vascularity are normal. No adenopathy. No bone lesions. IMPRESSION: Mild left base atelectasis. Lungs elsewhere clear. Stable cardiac silhouette. Electronically Signed   By: Bretta Bang III M.D.   On: 06/07/2018 10:58   Dg Hip Unilat With Pelvis 2-3 Views Left  Result Date: 06/07/2018 CLINICAL DATA:  67 year old male with dizziness this morning and fell down stairs. Severe left hip pain with hip deformity. EXAM: DG HIP (WITH OR WITHOUT PELVIS) 2-3V LEFT COMPARISON:  CT Abdomen and Pelvis 01/19/2017. FINDINGS: Comminuted intertrochanteric fracture of the proximal left femur with varus impaction and mildly displaced inferior trochanter butterfly fragment. The left femoral head remains normally located. The pelvis appears intact. Grossly intact proximal right femur. Negative visible bowel gas pattern. IMPRESSION: 1. Comminuted left femur intertrochanteric fracture with varus impaction and displaced inferior trochanter butterfly fragment. 2. No superimposed pelvic fracture. Electronically Signed   By: Odessa Fleming M.D.   On: 06/07/2018 09:21    EKG:   Orders placed or performed during the hospital encounter of 06/07/18  . EKG 12-Lead  . EKG 12-Lead  . ED EKG  . ED EKG   EKG showed normal sinus rhythm at 60 bpm, no ST-T changes. IMPRESSION AND PLAN:   67 year old patient with  history of closed head injuries, TBI, alcohol abuse comes from [facility] independent living facility due to fall and noted to have left hip fracture. #1, acute left femur fracture with varus impaction, comminuted fracture: Initial encounter.  Due to fall this morning.  Continue IV pain medicines, DVT prophylaxis, consultation with Dr. Rosita Kea regarding surgery. 2.  Community acquired pneumonia with cough, elevated white count: Continue Rocephin, Zithromax. 3.  History of COPD without wheezing but patient is a heavy smoker "smokes 2  packs of cigarettes a day, advised to quit, order nicotine patch, continue bronchodilators. 4.  History of TBI, mood disorder, restart Depakote. 5.  Hypertension: Hypotensive.  Hold Norvasc, continue gentle hydration. 6.  Small left posterior scalp hematoma due to fall: No neuro deficit. Discussed with patient's niece, sister.  All the records are reviewed and case discussed with ED provider. Management plans discussed with the patient, family and they are in agreement.  CODE STATUS: Full code  TOTAL TIME TAKING CARE OF THIS PATIENT: .    Katha Hamming M.D on 06/07/2018 at 11:37 AM  Between 7am to 6pm - Pager - [phone]  After 6pm go to [email or web address] - password [email or web address] [facility]  [facility] [place]  Office  [phone]  CC: Primary care physician; [facility]  Note: This dictation was prepared with Dragon dictation along with smaller phrase technology. Any transcriptional errors that result from this process are unintentional.

## 2018-06-07 NOTE — NC FL2 (Addendum)
[place] MEDICAID FL2 LEVEL OF CARE SCREENING TOOL     IDENTIFICATION  Patient Name: Jacob Villanueva Birthdate: 05-16-1951 Sex: male Admission Date (Current Location): 06/07/2018  [place] and [place] Number:  Chiropodist and Address:  [facility], [address] [place], [place] [address]      Provider Number: [id number]  Attending Physician Name and Address:  Katha Hamming, MD  Relative Name and Phone Number:       Current Level of Care: Hospital Recommended Level of Care: Skilled Nursing Facility Prior Approval Number:    Date Approved/Denied:   PASRR Number: [id number])  Discharge Plan: SNF    Current Diagnoses: Patient Active Problem List   Diagnosis Date Noted  . Fracture of femoral neck, left, closed (HCC) 06/07/2018  . Acute on chronic respiratory failure with hypoxia (HCC) 02/14/2017  . Sepsis (HCC) 02/09/2017  . Acute encephalopathy 01/19/2017    Orientation RESPIRATION BLADDER Height & Weight     Self, Time, Situation, Place  Normal Continent Weight: 200 lb (90.7 kg) Height:  6\' 2"  (188 cm)  BEHAVIORAL SYMPTOMS/MOOD NEUROLOGICAL BOWEL NUTRITION STATUS      Continent Diet(Diet: Regular )  AMBULATORY STATUS COMMUNICATION OF NEEDS Skin   Extensive Assist Verbally Surgical wounds                       Personal Care Assistance Level of Assistance  Bathing, Feeding, Dressing Bathing Assistance: Limited assistance Feeding assistance: Independent Dressing Assistance: Limited assistance     Functional Limitations Info  Sight, Hearing, Speech Sight Info: Adequate Hearing Info: Adequate Speech Info: Adequate    SPECIAL CARE FACTORS FREQUENCY  PT (By licensed PT), OT (By licensed OT)     PT Frequency: (5) OT Frequency: (5)            Contractures      Additional Factors Info  Code Status, Allergies Code Status Info: (Full Code. ) Allergies Info: (Bupropion, Effexor Venlafaxine Hcl, Amoxicillin,  Clindamycin/lincomycin)           Current Medications (06/07/2018):  This is the current hospital active medication list Current Facility-Administered Medications  Medication Dose Route Frequency Provider Last Rate Last Dose  . 0.9 %  sodium chloride infusion   Intravenous Continuous Katha Hamming, MD      . Mitzi Hansen Hold] acetaminophen (TYLENOL) tablet 650 mg  650 mg Oral Q6H PRN Katha Hamming, MD       Or  . Mitzi Hansen Hold] acetaminophen (TYLENOL) suppository 650 mg  650 mg Rectal Q6H PRN Katha Hamming, MD      . Mitzi Hansen Hold] allopurinol (ZYLOPRIM) tablet 100 mg  100 mg Oral Daily Katha Hamming, MD      . Mitzi Hansen Hold] bisacodyl (DULCOLAX) EC tablet 5 mg  5 mg Oral Daily PRN Katha Hamming, MD      . Mitzi Hansen Hold] divalproex (DEPAKOTE SPRINKLE) capsule 125 mg  125 mg Oral Q12H Katha Hamming, MD      . Mitzi Hansen Hold] docusate sodium (COLACE) capsule 100 mg  100 mg Oral BID Katha Hamming, MD      . Mitzi Hansen Hold] HYDROcodone-acetaminophen (NORCO/VICODIN) 5-325 MG per tablet 1-2 tablet  1-2 tablet Oral Q4H PRN Katha Hamming, MD      . Mitzi Hansen Hold] levofloxacin (LEVAQUIN) IVPB 500 mg  500 mg Intravenous Q24H Katha Hamming, MD      . Mitzi Hansen Hold] morphine 2 MG/ML injection 2 mg  2 mg Intravenous Q4H PRN Katha Hamming, MD  2 mg at 06/07/18 1249  . morphine 2 MG/ML injection           . [MAR Hold] morphine bolus via infusion 2 mg  2 mg Intravenous Once Kennedy Bucker, MD      . Mitzi Hansen Hold] ondansetron [facility]) tablet 4 mg  4 mg Oral Q6H PRN Katha Hamming, MD       Or  . Mitzi Hansen Hold] ondansetron (ZOFRAN) injection 4 mg  4 mg Intravenous Q6H PRN Katha Hamming, MD      . Mitzi Hansen Hold] sertraline (ZOLOFT) tablet 50 mg  50 mg Oral q morning - 10a Katha Hamming, MD      . Mitzi Hansen Hold] traZODone (DESYREL) tablet 25 mg  25 mg Oral QHS PRN Katha Hamming, MD       Facility-Administered Medications Ordered in Other Encounters  Medication Dose  Route Frequency Provider Last Rate Last Dose  . ePHEDrine injection   Intravenous Anesthesia Intra-op Casey Burkitt, CRNA   10 mg at 06/07/18 1414  . fentaNYL (SUBLIMAZE) injection    Anesthesia Intra-op Casey Burkitt, CRNA   50 mcg at 06/07/18 1352  . glycopyrrolate (ROBINUL) injection    Anesthesia Intra-op Casey Burkitt, CRNA   0.2 mg at 06/07/18 1419  . lactated ringers infusion    Continuous PRN Oda Cogan, Thuy, CRNA      . lidocaine (cardiac) 100 mg/24mL (XYLOCAINE) injection 2%   Intravenous Anesthesia Intra-op Casey Burkitt, CRNA   50 mg at 06/07/18 1352  . phenylephrine (NEO-SYNEPHRINE) 100 mcg/mL in sodium chloride 0.9 % 100 mL infusion   Intravenous Continuous PRN Casey Burkitt, CRNA   Stopped at 06/07/18 1456  . phenylephrine (NEO-SYNEPHRINE) injection   Intravenous Anesthesia Intra-op Casey Burkitt, CRNA   100 mcg at 06/07/18 1410  . propofol (DIPRIVAN) 10 mg/mL bolus/IV push    Anesthesia Intra-op Casey Burkitt, CRNA   50 mg at 06/07/18 1353  . propofol (DIPRIVAN) 500 MG/50ML infusion    Continuous PRN Casey Burkitt, CRNA   Stopped at 06/07/18 1456     Discharge Medications: Please see discharge summary for a list of discharge medications.  Relevant Imaging Results:  Relevant Lab Results:   Additional Information (SSN: [id number])  Javarie Crisp, [name], LCSW

## 2018-06-07 NOTE — Progress Notes (Signed)
Spoke with Pts niece Lamar Sprinkles who is also pts POA. Sarah requesting to go over medication list. Per Maralyn Sago pt does not take Gabapentin, Allopurinol and Depakote. MD Luberta Mutter notified. Orders received to d/c. Medications d/c.

## 2018-06-07 NOTE — ED Notes (Signed)
L foot pulse 2+.

## 2018-06-07 NOTE — ED Provider Notes (Signed)
[facility] Emergency Department Provider Note ____________________________________________   First MD Initiated Contact with Patient 06/07/18 1133     (approximate)  I have reviewed the triage vital signs and the nursing notes.   HISTORY  Chief Complaint Fall and Near Syncope  HPI Jacob Villanueva is a 67 y.o. male here for evaluation of left hip pain after fall  Patient reports about 3 to 4 days now is lived by himself and is felt a little lightheaded, he is fallen about 3 or 4 times.  Denies headache.  No nausea vomiting.  No fevers or chills.  No chest pain or trouble breathing.  He does feel fatigued however  just reports his been feeling just fatigued a little lightheaded for 3 days.  Reports moderate pain in the left hip but pretty severe with movement.  Denies numbness or tingling in the left leg  Past Medical History:  Diagnosis Date  . Bacteremia 01/2017  . Hypertension   . TBI (traumatic brain injury) [facility])     Patient Active Problem List   Diagnosis Date Noted  . Fracture of femoral neck, left, closed (HCC) 06/07/2018  . Acute on chronic respiratory failure with hypoxia (HCC) 02/14/2017  . Sepsis (HCC) 02/09/2017  . Acute encephalopathy 01/19/2017    Past Surgical History:  Procedure Laterality Date  . APPENDECTOMY    . FRACTURE SURGERY      Prior to Admission medications   Medication Sig Start Date End Date Taking? Authorizing Provider  allopurinol (ZYLOPRIM) 100 MG tablet Take 100 mg by mouth daily. 01/12/17   [provider]  amLODipine (NORVASC) 5 MG tablet Take 1 tablet (5 mg total) by mouth daily. 01/24/17   Alford Highland, MD  calcium carbonate (TUMS EX) 750 MG chewable tablet Chew 1 tablet by mouth as needed.     [provider]  divalproex (DEPAKOTE SPRINKLE) 125 MG capsule Take 125 mg by mouth 2 (two) times daily as needed. 02/07/17   [provider]  esomeprazole (NEXIUM) 40 MG capsule Take 40 mg by  mouth 2 (two) times daily before a meal.     [provider]  gabapentin (NEURONTIN) 300 MG capsule Take 1 capsule (300 mg total) by mouth 2 (two) times daily. 01/24/17   Alford Highland, MD  ipratropium-albuterol (DUONEB) 0.5-2.5 (3) MG/3ML SOLN Take 3 mLs by nebulization 3 (three) times daily. 01/24/17   Alford Highland, MD  magnesium oxide (MAG-OX) 400 (241.3 Mg) MG tablet Take 1 tablet (400 mg total) by mouth 2 (two) times daily. 01/24/17   Alford Highland, MD  metoCLOPramide (REGLAN) 5 MG tablet Take 5 mg by mouth 3 (three) times daily with meals as needed. 01/13/17   [provider]  NON FORMULARY 1 Dose every 3 (three) months. Testa Pill. A testosterone pill that is implanted into side q 3 months    [provider]  ranitidine (ZANTAC) 150 MG tablet Take 150 mg by mouth at bedtime.    [provider]  sertraline (ZOLOFT) 50 MG tablet Take 50 mg by mouth every morning. 01/12/17   [provider]    Allergies Clindamycin/lincomycin; Effexor [venlafaxine hcl]; and Amoxicillin  History reviewed. No pertinent family history.  Social History Social History   Tobacco Use  . Smoking status: Current Every Day Smoker    Packs/day: 2.00  . Smokeless tobacco: Never Used  Substance Use Topics  . Alcohol use: Yes  . Drug use: No    Review of Systems Constitutional:  No fever/chills feels fatigue slight lightheaded for 3 to 4 days Eyes: No visual changes. ENT: No sore throat. Cardiovascular: Denies chest pain. Respiratory: Denies shortness of breath. Gastrointestinal: No abdominal pain.   Genitourinary: Negative for dysuria. Musculoskeletal: Negative for back pain.  Reports notable pain in the left leg.  No neck pain. Skin: Negative for rash. Neurological: Negative for headaches, areas of focal weakness or numbness.    ____________________________________________   PHYSICAL EXAM:  VITAL SIGNS: ED Triage Vitals  Enc Vitals Group     BP  06/07/18 0808 115/73     Pulse Rate 06/07/18 0849 61     Resp 06/07/18 0808 16     Temp 06/07/18 0808 98 F (36.7 C)     Temp Source 06/07/18 0808 Oral     SpO2 06/07/18 0804 97 %     Weight 06/07/18 0810 200 lb (90.7 kg)     Height 06/07/18 0810 6\' 2"  (1.88 m)     Head Circumference --      Peak Flow --      Pain Score 06/07/18 0810 8     Pain Loc --      Pain Edu? --      Excl. in GC? --    Constitutional: Alert and oriented. Well appearing and in no acute distress.  Does appear slightly fatigued however. Eyes: Conjunctivae are normal.  Head: Atraumatic. Nose: No congestion/rhinnorhea.  No cervical tenderness. Mouth/Throat: Mucous membranes are moist. Neck: No stridor.  Cardiovascular: Normal rate, regular rhythm. Grossly normal heart sounds.  Good peripheral circulation. Respiratory: Normal respiratory effort.  No retractions. Lungs CTAB. Gastrointestinal: Soft and nontender. No distention. Musculoskeletal: No lower extremity tenderness involving the right leg.  The left leg is shortened and rotated with notable tenderness involving the left hip.  Strong dorsalis pedis good toe wiggle in the left lower extremity.  Normal cap refill. Neurologic:  Normal speech and language. No gross focal neurologic deficits are appreciated.  Skin:  Skin is warm, dry and intact. No rash noted. Psychiatric: Mood and affect are normal. Speech and behavior are normal.  ____________________________________________   LABS (all labs ordered are listed, but only abnormal results are displayed)  Labs Reviewed  BASIC METABOLIC PANEL - Abnormal; Notable for the following components:      Result Value   Glucose, Bld 107 (*)    All other components within normal limits  CBC - Abnormal; Notable for the following components:   WBC 16.6 (*)    All other components within normal limits  URINALYSIS, COMPLETE (UACMP) WITH MICROSCOPIC - Abnormal; Notable for the following components:   Color, Urine YELLOW  (*)    APPearance CLEAR (*)    All other components within normal limits  APTT  HIV ANTIBODY (ROUTINE TESTING W REFLEX)  TYPE AND SCREEN   ____________________________________________  EKG  Reviewed and entered by me Heart rate 69 cures 100 QTc 440 Normal sinus rhythm EKG time 8:10 AM Nonspecific abnormality, no evidence of acute ischemia denoted however ____________________________________________  RADIOLOGY  Ct Head Wo Contrast  Result Date: 06/07/2018 CLINICAL DATA:  Unwitnessed fall. EXAM: CT HEAD WITHOUT CONTRAST CT CERVICAL SPINE WITHOUT CONTRAST TECHNIQUE: Multidetector CT imaging of the head and cervical spine was performed following the standard protocol without intravenous contrast. Multiplanar CT image reconstructions of the cervical spine were also generated. COMPARISON:  None. FINDINGS: CT HEAD FINDINGS Brain: Mild chronic ischemic white matter disease is noted. No mass effect or midline shift is noted. Ventricular size  is within normal limits. There is no evidence of mass lesion, hemorrhage or acute infarction. Vascular: No hyperdense vessel or unexpected calcification. Skull: Normal. Negative for fracture or focal lesion. Sinuses/Orbits: Minimal bilateral ethmoid sinusitis is noted. Other: Small left posterior scalp hematoma is noted. CT CERVICAL SPINE FINDINGS Alignment: Minimal grade 1 anterolisthesis of C4-5 is noted secondary to posterior facet joint hypertrophy. Skull base and vertebrae: No acute fracture. No primary bone lesion or focal pathologic process. Soft tissues and spinal canal: No prevertebral fluid or swelling. No visible canal hematoma. Disc levels: Moderate degenerative disc disease is noted at C5-6 and C6-7. Upper chest: Negative. Other: Degenerative changes are seen involving the right-sided posterior facet joints. IMPRESSION: Mild chronic ischemic white matter disease. Small left posterior scalp hematoma. No acute intracranial abnormality seen. Moderate  multilevel degenerative disc disease. No acute abnormality seen in the cervical spine. Electronically Signed   By: Lupita Raider, M.D.   On: 06/07/2018 10:02   Ct Cervical Spine Wo Contrast  Result Date: 06/07/2018 CLINICAL DATA:  Unwitnessed fall. EXAM: CT HEAD WITHOUT CONTRAST CT CERVICAL SPINE WITHOUT CONTRAST TECHNIQUE: Multidetector CT imaging of the head and cervical spine was performed following the standard protocol without intravenous contrast. Multiplanar CT image reconstructions of the cervical spine were also generated. COMPARISON:  None. FINDINGS: CT HEAD FINDINGS Brain: Mild chronic ischemic white matter disease is noted. No mass effect or midline shift is noted. Ventricular size is within normal limits. There is no evidence of mass lesion, hemorrhage or acute infarction. Vascular: No hyperdense vessel or unexpected calcification. Skull: Normal. Negative for fracture or focal lesion. Sinuses/Orbits: Minimal bilateral ethmoid sinusitis is noted. Other: Small left posterior scalp hematoma is noted. CT CERVICAL SPINE FINDINGS Alignment: Minimal grade 1 anterolisthesis of C4-5 is noted secondary to posterior facet joint hypertrophy. Skull base and vertebrae: No acute fracture. No primary bone lesion or focal pathologic process. Soft tissues and spinal canal: No prevertebral fluid or swelling. No visible canal hematoma. Disc levels: Moderate degenerative disc disease is noted at C5-6 and C6-7. Upper chest: Negative. Other: Degenerative changes are seen involving the right-sided posterior facet joints. IMPRESSION: Mild chronic ischemic white matter disease. Small left posterior scalp hematoma. No acute intracranial abnormality seen. Moderate multilevel degenerative disc disease. No acute abnormality seen in the cervical spine. Electronically Signed   By: Lupita Raider, M.D.   On: 06/07/2018 10:02   Dg Chest Portable 1 View  Result Date: 06/07/2018 CLINICAL DATA:  Elevated white blood cell  count.  Hypertension. EXAM: PORTABLE CHEST 1 VIEW COMPARISON:  February 14, 2017 FINDINGS: There is slight atelectatic change in the left base. The lungs elsewhere are clear. The heart size and pulmonary vascularity are normal. No adenopathy. No bone lesions. IMPRESSION: Mild left base atelectasis. Lungs elsewhere clear. Stable cardiac silhouette. Electronically Signed   By: Bretta Bang III M.D.   On: 06/07/2018 10:58   Dg Hip Unilat With Pelvis 2-3 Views Left  Result Date: 06/07/2018 CLINICAL DATA:  67 year old male with dizziness this morning and fell down stairs. Severe left hip pain with hip deformity. EXAM: DG HIP (WITH OR WITHOUT PELVIS) 2-3V LEFT COMPARISON:  CT Abdomen and Pelvis 01/19/2017. FINDINGS: Comminuted intertrochanteric fracture of the proximal left femur with varus impaction and mildly displaced inferior trochanter butterfly fragment. The left femoral head remains normally located. The pelvis appears intact. Grossly intact proximal right femur. Negative visible bowel gas pattern. IMPRESSION: 1. Comminuted left femur intertrochanteric fracture with varus impaction and displaced inferior  trochanter butterfly fragment. 2. No superimposed pelvic fracture. Electronically Signed   By: Odessa Fleming M.D.   On: 06/07/2018 09:21     CT and x-ray imaging reviewed by me   Most suitable for left hip injury.  Discussed case with Dr. Rosita Kea of orthopedics, will consult on patient and hospital or ER this afternoon. ____________________________________________   PROCEDURES  Procedure(s) performed: None  Procedures  Critical Care performed: No  ____________________________________________   INITIAL IMPRESSION / ASSESSMENT AND PLAN / ED COURSE  Pertinent labs & imaging results that were available during my care of the patient were reviewed by me and considered in my medical decision making (see chart for details).   Patient presents after multiple falls, this fall sustaining pain and notable  injury to the left hip.  Positive hip fracture.  Consulted orthopedics regarding this, additionally the patient has been feeling fatigued for a few days and we will work him up for this he does have some slight leukocytosis but no obvious source to noted at this time, the time of admission discussed with Dr. Luberta Mutter and elevated suspicion for infection though no identifiable source yet.  Hospital service will evaluate for further work-up and care and also understands plan for orthopedic consult  Patient and family understand agreeable with plan for admission.      ____________________________________________   FINAL CLINICAL IMPRESSION(S) / ED DIAGNOSES  Final diagnoses:  Leukocytosis, unspecified type  Fall, initial encounter  Closed fracture of left hip, initial encounter [facility])        Note:  This document was prepared using Dragon voice recognition software and may include unintentional dictation errors       Sharyn Creamer, MD 06/07/18 1158

## 2018-06-07 NOTE — Progress Notes (Signed)
Family Meeting Note  Advance Directive:yes  Today a meeting took place with the Patient and sister. Patient sister, niece were at bedside, discussed the CODE STATUS with them.  They mentioned that patient is full code wants to continue current care  the following clinical team members were present during this meeting:MD  The following were discussed:Patient's diagnosis: , Patient's progosis: Unable to determine and Goals for treatment: Continue present management  Additional follow-up to be provided: full code  Time spent during discussion:16 min Katha Hamming, MD

## 2018-06-07 NOTE — ED Notes (Signed)
Per sister, they took all pts belonging home. Room checked - no belonging left in room

## 2018-06-07 NOTE — Anesthesia Post-op Follow-up Note (Signed)
Anesthesia QCDR form completed.        

## 2018-06-07 NOTE — Anesthesia Preprocedure Evaluation (Addendum)
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    Neuro/Psych H/o TBI negative psych ROS   GI/Hepatic negative GI ROS, Neg liver ROS,   Endo/Other  negative endocrine ROS  Renal/GU      Musculoskeletal   Abdominal   Peds  Hematology negative hematology ROS (+)   Anesthesia Other Findings Past Medical History: 01/2017: Bacteremia No date: Hypertension No date: TBI (traumatic brain injury) (HCC)  Past Surgical History: No date: APPENDECTOMY No date: FRACTURE SURGERY  BMI    Body Mass Index:  25.68 kg/m      Reproductive/Obstetrics negative OB ROS                            Anesthesia Physical Anesthesia Plan  ASA: III  Anesthesia Plan: Spinal   Post-op Pain Management:    Induction:   PONV Risk Score and Plan: Propofol infusion and TIVA  Airway Management Planned:   Additional Equipment:   Intra-op Plan:   Post-operative Plan:   Informed Consent: I have reviewed the patients History and Physical, chart, labs and discussed the procedure including the risks, benefits and alternatives for the proposed anesthesia with the patient or authorized representative who has indicated his/her understanding and acceptance.   Dental Advisory Given  Plan Discussed with: Anesthesiologist, CRNA and Surgeon  Anesthesia Plan Comments:         Anesthesia Quick Evaluation

## 2018-06-08 ENCOUNTER — Encounter: Payer: Self-pay | Admitting: Orthopedic Surgery

## 2018-06-08 LAB — CBC
HCT: 30.8 % — ABNORMAL LOW (ref 39.0–52.0)
HEMOGLOBIN: 10.1 g/dL — AB (ref 13.0–17.0)
MCH: 31.5 pg (ref 26.0–34.0)
MCHC: 32.8 g/dL (ref 30.0–36.0)
MCV: 96 fL (ref 80.0–100.0)
NRBC: 0 % (ref 0.0–0.2)
Platelets: 217 10*3/uL (ref 150–400)
RBC: 3.21 MIL/uL — AB (ref 4.22–5.81)
RDW: 12.4 % (ref 11.5–15.5)
WBC: 8.8 10*3/uL (ref 4.0–10.5)

## 2018-06-08 LAB — BASIC METABOLIC PANEL
Anion gap: 8 (ref 5–15)
BUN: 16 mg/dL (ref 8–23)
CALCIUM: 8.3 mg/dL — AB (ref 8.9–10.3)
CO2: 24 mmol/L (ref 22–32)
Chloride: 106 mmol/L (ref 98–111)
Creatinine, Ser: 0.76 mg/dL (ref 0.61–1.24)
Glucose, Bld: 142 mg/dL — ABNORMAL HIGH (ref 70–99)
POTASSIUM: 3.9 mmol/L (ref 3.5–5.1)
Sodium: 138 mmol/L (ref 135–145)

## 2018-06-08 LAB — GLUCOSE, CAPILLARY: Glucose-Capillary: 168 mg/dL — ABNORMAL HIGH (ref 70–99)

## 2018-06-08 LAB — HIV ANTIBODY (ROUTINE TESTING W REFLEX): HIV SCREEN 4TH GENERATION: NONREACTIVE

## 2018-06-08 MED ORDER — VITAMIN B-1 100 MG PO TABS
100.0000 mg | ORAL_TABLET | Freq: Every day | ORAL | Status: DC
  Administered 2018-06-08 – 2018-06-10 (×3): 100 mg via ORAL
  Filled 2018-06-08 (×3): qty 1

## 2018-06-08 MED ORDER — LORAZEPAM 2 MG/ML IJ SOLN: 1.0000 mg | Freq: Four times a day (QID) | INTRAMUSCULAR | Status: DC | PRN

## 2018-06-08 MED ORDER — LEVOFLOXACIN 500 MG PO TABS: 500.0000 mg | ORAL_TABLET | Freq: Every day | ORAL | Status: DC

## 2018-06-08 MED ORDER — THIAMINE HCL 100 MG/ML IJ SOLN: 100.0000 mg | Freq: Every day | INTRAMUSCULAR | Status: DC

## 2018-06-08 MED ORDER — LEVOFLOXACIN 500 MG PO TABS
500.0000 mg | ORAL_TABLET | Freq: Every day | ORAL | Status: DC
  Administered 2018-06-09 – 2018-06-10 (×2): 500 mg via ORAL
  Filled 2018-06-08 (×2): qty 1

## 2018-06-08 MED ORDER — GUAIFENESIN 100 MG/5ML PO SOLN
5.0000 mL | ORAL | Status: DC | PRN
  Filled 2018-06-08: qty 5

## 2018-06-08 MED ORDER — NICOTINE 14 MG/24HR TD PT24
14.0000 mg | MEDICATED_PATCH | Freq: Every day | TRANSDERMAL | Status: DC
  Filled 2018-06-08 (×2): qty 1

## 2018-06-08 MED ORDER — FOLIC ACID 1 MG PO TABS
1.0000 mg | ORAL_TABLET | Freq: Every day | ORAL | Status: DC
  Administered 2018-06-08 – 2018-06-10 (×3): 1 mg via ORAL
  Filled 2018-06-08 (×3): qty 1

## 2018-06-08 MED ORDER — LORAZEPAM 1 MG PO TABS: 1.0000 mg | ORAL_TABLET | Freq: Four times a day (QID) | ORAL | Status: DC | PRN

## 2018-06-08 MED ORDER — ADULT MULTIVITAMIN W/MINERALS CH
1.0000 | ORAL_TABLET | Freq: Every day | ORAL | Status: DC
  Administered 2018-06-08 – 2018-06-10 (×3): 1 via ORAL
  Filled 2018-06-08 (×3): qty 1

## 2018-06-08 NOTE — Progress Notes (Signed)
   Subjective: 1 Day Post-Op Procedure(s) (LRB): INTRAMEDULLARY (IM) NAIL INTERTROCHANTRIC (Left) Patient reports pain as moderate.   Patient is well, and has had no acute complaints or problems.  States he wants to get up and walk but no longer let him walk. Denies any CP, SOB, ABD pain. We will start physical therapy today.   Objective: Vital signs in last 24 hours: Temp:  [97.6 F (36.4 C)-98.8 F (37.1 C)] 98.2 F (36.8 C) (10/25 0812) Pulse Rate:  [54-102] 83 (10/25 0812) Resp:  [12-19] 17 (10/25 0340) BP: (79-140)/(54-95) 140/82 (10/25 0812) SpO2:  [92 %-99 %] 95 % (10/25 0812) Weight:  [90.4 kg-90.7 kg] 90.4 kg (10/25 0340)  Intake/Output from previous day: 10/24 0701 - 10/25 0700 In: 1932.5 [P.O.:840; I.V.:792.5; IV Piggyback:300] Out: 695 [Urine:675; Blood:20] Intake/Output this shift: No intake/output data recorded.  Recent Labs    06/07/18 0814 06/08/18 0400  HGB 13.8 10.1*   Recent Labs    06/07/18 0814 06/08/18 0400  WBC 16.6* 8.8  RBC 4.35 3.21*  HCT 41.4 30.8*  PLT 272 217   Recent Labs    06/07/18 0814 06/08/18 0400  NA 138 138  K 4.3 3.9  CL 103 106  CO2 26 24  BUN 13 16  CREATININE 0.89 0.76  GLUCOSE 107* 142*  CALCIUM 9.3 8.3*   No results for input(s): LABPT, INR in the last 72 hours.  EXAM General - Patient is Alert, Appropriate and Oriented Extremity - Neurovascular intact Sensation intact distally Intact pulses distally Dorsiflexion/Plantar flexion intact No cellulitis present Compartment soft Dressing - dressing C/D/I and no drainage Motor Function - intact, moving foot and toes well on exam.   Past Medical History:  Diagnosis Date  . Bacteremia 01/2017  . Hypertension   . TBI (traumatic brain injury) (HCC)     Assessment/Plan:   1 Day Post-Op Procedure(s) (LRB): INTRAMEDULLARY (IM) NAIL INTERTROCHANTRIC (Left) Active Problems:   Fracture of femoral neck, left, closed (HCC)  Estimated body mass index is 25.6  kg/m as calculated from the following:   Height as of this encounter: 6\' 2"  (1.88 m).   Weight as of this encounter: 90.4 kg. Advance diet Up with therapy  Needs bowel movement Labs are stable Care management to assist with discharge  DVT Prophylaxis - Aspirin, TED hose and SCDs Weight-Bearing as tolerated to left leg   T. Cranston Neighbor, PA-C [facility] 06/08/2018, 8:18 AM

## 2018-06-08 NOTE — Progress Notes (Signed)
Blue Medicare SNF authorization through [place] [facility] was received today, authorization # [id number]. Plan is for patient to D/C to [facility] over the weekend if medically stable. Clinical Education officer, museum (CSW) met with patient and his sister Jeannene Patella and made them aware of above. CSW also made patient and his sister aware that [facility] is a non-smoking facility. Patient is agreeable to follow the non-smoking policy. Angela admissions coordinator at [facility] is aware of above. CSW will continue to follow and assist as needed.   [employer], LCSW [phone]

## 2018-06-08 NOTE — Clinical Social Work Placement (Signed)
   CLINICAL SOCIAL WORK PLACEMENT  NOTE  Date:  06/08/2018  Patient Details  Name: Jacob Villanueva MRN: [id number] Date of Birth: 26-Oct-1950  Clinical Social Work is seeking post-discharge placement for this patient at the Skilled  Nursing Facility level of care (*CSW will initial, date and re-position this form in  chart as items are completed):  Yes   Patient/family provided with [place] Clinical Social Work Department's list of facilities offering this level of care within the geographic area requested by the patient (or if unable, by the patient's family).  Yes   Patient/family informed of their freedom to choose among providers that offer the needed level of care, that participate in Medicare, Medicaid or managed care program needed by the patient, have an available bed and are willing to accept the patient.  Yes   Patient/family informed of [facility] ownership interest in [facility] and [facility], as well as of the fact that they are under no obligation to receive care at these facilities.  PASRR submitted to EDS on       PASRR number received on       Existing PASRR number confirmed on 06/07/18     FL2 transmitted to all facilities in geographic area requested by pt/family on 06/07/18     FL2 transmitted to all facilities within larger geographic area on       Patient informed that his/her managed care company has contracts with or will negotiate with certain facilities, including the following:        Yes   Patient/family informed of bed offers received.  Patient chooses bed at [facility]. )     Physician recommends and patient chooses bed at      Patient to be transferred to   on  .  Patient to be transferred to facility by       Patient family notified on   of transfer.  Name of family member notified:        PHYSICIAN       Additional Comment:    _______________________________________________ [name], [name], LCSW 06/08/2018,  11:43 AM

## 2018-06-08 NOTE — Evaluation (Signed)
Physical Therapy Evaluation Patient Details Name: Jacob Villanueva MRN: [id number] DOB: 06/09/1951 Today's Date: 06/08/2018   History of Present Illness  Pt is a 67 y.o. 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  WBC elevated to 16.6 and chest x-ray showed left base pneumonia.  Pt now s/p L hip IM nail. PMH also includes: COPD, HTN, MI, and EtOH abuse.      Clinical Impression  Pt presents with deficits in strength, transfers, mobility, gait, balance, and activity tolerance.  Pt required extensive assistance with bed mobility tasks and CGA with cues for sequencing to stand from an elevated EOB.  Pt was very antalgic on the LLE in standing and was only able to take several very small steps with heavy leaning on the RW.  Of note pt demonstrated very poor STM recall during the session and perseverated throughout on a desire to walk outside to smoke.  Pt educated on importance of smoking cessation especially related to wound healing with pt asking to smoke immediately after education provided.  Upon initially standing up at the EOB pt began reaching forward for the sink drawers to look for cigarettes.  Pt will benefit from PT services in a SNF setting upon discharge to safely address above deficits for decreased caregiver assistance and eventual return to PLOF.      Follow Up Recommendations SNF;Supervision for mobility/OOB    Equipment Recommendations  Other (comment)(TBD at next venue of care)    Recommendations for Other Services       Precautions / Restrictions Precautions Precautions: Fall Precaution Comments: Pt impulsive with poor safety awareness Restrictions Weight Bearing Restrictions: Yes LLE Weight Bearing: Weight bearing as tolerated      Mobility  Bed Mobility Overal bed mobility: Needs Assistance Bed Mobility: Supine to Sit;Sit  to Supine     Supine to sit: Mod assist Sit to supine: Mod assist   General bed mobility comments: Mod A for LLE in and out of bed and for trunk to full upright position during sup to sit  Transfers Overall transfer level: Needs assistance Equipment used: Rolling walker (2 wheeled) Transfers: Sit to/from Stand Sit to Stand: From elevated surface;Min guard         General transfer comment: Mod verbal and tactile cues for hand placement  Ambulation/Gait Ambulation/Gait assistance: Min assist Gait Distance (Feet): 3 Feet Assistive device: Rolling walker (2 wheeled) Gait Pattern/deviations: Step-to pattern;Antalgic;Decreased stance time - left Gait velocity: decreased   General Gait Details: Min A to guide RW during limited amb at the EOB with very antalgic gait on the LLE and heavy reliance on the RW for stability  Stairs            Wheelchair Mobility    Modified Rankin (Stroke Patients Only)       Balance Overall balance assessment: Mild deficits observed, not formally tested                                           Pertinent Vitals/Pain Pain Assessment: 0-10 Pain Score: 8  Pain Location: L hip Pain Descriptors / Indicators: Aching;Sore Pain Intervention(s): Premedicated before session;Monitored during session;Limited activity within patient's tolerance    Home Living Family/patient expects to be discharged to:: Private residence([facility]) Living Arrangements: Alone   Type of Home: Independent living facility Home Access: Level entry;Elevator       Home Equipment: None Additional Comments: [facility]    Prior Function Level of Independence: Needs assistance   Gait / Transfers Assistance Needed: Ind Amb without AD in facility, no other recent fall history other than current fall down the stairs  ADL's / Homemaking Assistance Needed: Pt Ind with bathing and dressing with facility providing meals and meds         Hand Dominance        Extremity/Trunk Assessment   Upper Extremity Assessment Upper Extremity Assessment: Overall WFL for tasks assessed    Lower Extremity Assessment Lower Extremity Assessment: Generalized weakness;LLE deficits/detail LLE: Unable to fully assess due to pain       Communication   Communication: No difficulties  Cognition Arousal/Alertness: Awake/alert Behavior During Therapy: Flat affect Overall Cognitive Status: No family/caregiver present to determine baseline cognitive functioning                                 General Comments: Pt impulsive with poor STM recall during the session      General Comments      Exercises Total Joint Exercises Ankle Circles/Pumps: AROM;Both;10 reps Quad Sets: Strengthening;Both;10 reps Gluteal Sets: Strengthening;Both;10 reps Heel Slides: AROM;Right;5 reps Hip ABduction/ADduction: AROM;Right;5 reps Straight Leg Raises: AROM;Right;5 reps Long Arc Quad: AROM;Both;10 reps Knee Flexion: AROM;Both;10 reps Marching in Standing: AROM;Left;5 reps Other Exercises Other Exercises: Standing L hip SLR x 5 Other Exercises: Mini squats x 5 with low amplitude   Assessment/Plan    PT Assessment Patient needs continued PT services  PT Problem List Decreased strength;Decreased activity tolerance;Decreased balance;Decreased knowledge of use of DME;Decreased mobility       PT Treatment Interventions DME instruction;Gait training;Functional mobility training;Balance training;Therapeutic exercise;Therapeutic activities;Patient/family education    PT Goals (Current goals can be found in the Care Plan section)  Acute Rehab PT Goals Patient Stated Goal: To walk again PT Goal Formulation: With patient Time For Goal Achievement: 06/21/18 Potential to Achieve Goals: Good    Frequency BID   Barriers to discharge Inaccessible home environment;Decreased caregiver support      Co-evaluation                AM-PAC PT "6 Clicks" Daily Activity  Outcome Measure Difficulty turning over in bed (including adjusting bedclothes, sheets and blankets)?: Unable Difficulty moving from lying on back to sitting on the side of the bed? : Unable Difficulty sitting down on and standing up from a chair with arms (e.g., wheelchair, bedside commode, etc,.)?: Unable Help needed moving to and from a bed to chair (including a wheelchair)?: A Little Help needed walking in hospital room?: Total Help needed climbing 3-5 steps with a railing? : Total 6 Click Score: 8    End of Session Equipment Utilized During Treatment: Gait belt Activity Tolerance: Patient limited by pain Patient left: in bed;with bed alarm set;with SCD's reapplied;with call bell/phone within reach;Other (comment)(Mat on floor beside bed; pt refused up in chair) Nurse Communication: Mobility status PT Visit Diagnosis: Unsteadiness on feet (R26.81);Muscle weakness (generalized) (M62.81);Other abnormalities of gait and mobility (R26.89)    Time: 1610-9604 PT Time Calculation (min) (ACUTE ONLY): 33 min   Charges:   PT Evaluation $PT Eval Low Complexity: 1 Low PT Treatments $Therapeutic Exercise: 8-22 mins        D. Scott  Cera Rorke PT, DPT 06/08/18, 11:17 AM

## 2018-06-08 NOTE — Anesthesia Postprocedure Evaluation (Signed)
Anesthesia Post Note  Patient: Jacob Villanueva  Procedure(s) Performed: INTRAMEDULLARY (IM) NAIL INTERTROCHANTRIC (Left )  Patient location during evaluation: PACU Anesthesia Type: Spinal and General Level of consciousness: awake and alert Pain management: pain level controlled Vital Signs Assessment: post-procedure vital signs reviewed and stable Respiratory status: spontaneous breathing, nonlabored ventilation, respiratory function stable and patient connected to nasal cannula oxygen Cardiovascular status: blood pressure returned to baseline and stable Postop Assessment: no apparent nausea or vomiting Anesthetic complications: no     Last Vitals:  Vitals:   06/07/18 2242 06/08/18 0340  BP: 107/68 100/62  Pulse: 73 72  Resp: 18 17  Temp:  36.9 C  SpO2: 95% 94%    Last Pain:  Vitals:   06/08/18 0524  TempSrc:   PainSc: 8                  Jovita Gamma

## 2018-06-08 NOTE — Evaluation (Signed)
Occupational Therapy Evaluation Patient Details Name: Jacob Villanueva MRN: [id number] DOB: May 22, 1951 Today's Date: 06/08/2018    History of Present Illness Pt is a 67 y.o. male who was admitted to [facility] for L hip IM nailing of a  left femur fracture following a fall from stairs. Pt. has an elevated WBC count with Pneumonia. Pt. 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     Clinical Impression   Pt. presents with 8/10 left hip pain, weakness, limited activity tolerance, impulsivity, cognitive impairments, and limited functional mobility which limits his ability to complete basic ADL and IADL functioning. Pt. resides at [facility] Pt. reports being independent with ADLs, and IADL functioning. Pt. reports having assistance with medication management. Pt. attended meals in a common dining area. Pt. education was provided about A/E use for LE ADLs. Pt. requires frequent cues for redirection, and clarification of the purpose of the A/E during the session especially with the reacher. Pt. perseverated on using the reacher in the [employer] to diffuse bombs, and AEDs. Pt. could benefit from OT services for ADL training, A/E training, cognitive compensatory strategies during ADLs, and pt. education about home modification, and DME. Pt. would benefit from SNF level of care upon discharge. Pt. could benefit from follow-up OT services at discharge.     Follow Up Recommendations  SNF    Equipment Recommendations       Recommendations for Other Services       Precautions / Restrictions Precautions Precautions: Fall Precaution Comments: Pt impulsive with poor safety awareness Restrictions Weight Bearing Restrictions: Yes LLE Weight Bearing: Weight bearing as tolerated      Mobility Bed Mobility Overal bed mobility: Needs Assistance Bed Mobility: Supine to Sit;Sit to  Supine     Supine to sit: Mod assist Sit to supine: Mod assist   General bed mobility comments: Mobility per PT.  Transfers Overall transfer level: Needs assistance Equipment used: Rolling walker (2 wheeled) Transfers: Sit to/from Stand Sit to Stand: From elevated surface;Min guard         General transfer comment: Mod verbal and tactile cues for hand placement    Balance Overall balance assessment: Mild deficits observed, not formally tested                                         ADL either performed or assessed with clinical judgement   ADL Overall ADL's : Needs assistance/impaired Eating/Feeding: Set up;Independent;Bed level   Grooming: Set up;Independent;Bed level   Upper Body Bathing: Minimal assistance;Set up;Bed level   Lower Body Bathing: Maximal assistance;Set up;Bed level   Upper Body Dressing : Set up;Minimal assistance;Bed level   Lower Body Dressing: Set up;Maximal assistance;Bed level                 General ADL Comments: Pt. education was provided about UE ther. Ex.      Vision Baseline Vision/History: Wears glasses Wears Glasses: At all times Patient Visual Report: No change from baseline       Perception     Praxis      Pertinent Vitals/Pain Pain Assessment: 0-10 Pain Score: 8  Pain Location: L hip Pain Descriptors / Indicators: Aching;Sore Pain Intervention(s): Premedicated before session;Monitored during session;Limited activity within patient's tolerance     Hand Dominance  Extremity/Trunk Assessment Upper Extremity Assessment Upper Extremity Assessment: Overall WFL for tasks assessed       Communication Communication Communication: No difficulties   Cognition Arousal/Alertness: Awake/alert Behavior During Therapy: Flat affect Overall Cognitive Status: No family/caregiver present to determine baseline cognitive functioning                                 General Comments: Pt  impulsive with poor STM recall during the session   General Comments       Exercises    Shoulder Instructions      Home Living Family/patient expects to be discharged to:: Private residence([facility]) Living Arrangements: Alone   Type of Home: Independent living facility Home Access: Level entry;Elevator               Bathroom Toilet: Standard     Home Equipment: None   Additional Comments: [facility]      Prior Functioning/Environment Level of Independence: Needs assistance  Gait / Transfers Assistance Needed: Ind Amb without AD in facility, no other recent fall history other than current fall down the stairs ADL's / Homemaking Assistance Needed: Pt Ind with bathing and dressing with facility providing meals and meds            OT Problem List: Decreased strength;Decreased activity tolerance;Decreased knowledge of use of DME or AE;Impaired UE functional use;Pain      OT Treatment/Interventions: Self-care/ADL training;Therapeutic exercise;Patient/family education;Energy conservation;Therapeutic activities;DME and/or AE instruction    OT Goals(Current goals can be found in the care plan section) Acute Rehab OT Goals Patient Stated Goal: To be indepedent OT Goal Formulation: With patient Potential to Achieve Goals: Good  OT Frequency: Min 2X/week   Barriers to D/C:            Co-evaluation              AM-PAC PT "6 Clicks" Daily Activity     Outcome Measure Help from another person eating meals?: None Help from another person taking care of personal grooming?: A Little Help from another person toileting, which includes using toliet, bedpan, or urinal?: A Lot Help from another person bathing (including washing, rinsing, drying)?: A Lot Help from another person to put on and taking off regular upper body clothing?: A Little Help from another person to put on and taking off regular lower body clothing?: A Lot 6 Click Score: 16   End of  Session    Activity Tolerance: Patient tolerated treatment well Patient left: in bed;with call bell/phone within reach;with bed alarm set  OT Visit Diagnosis: Muscle weakness (generalized) (M62.81)                Time: 1610-9604 OT Time Calculation (min): 18 min Charges:  OT General Charges $OT Visit: 1 Visit OT Evaluation $OT Eval Moderate Complexity: 1 Mod  Olegario Messier, MS, OTR/L  Olegario Messier 06/08/2018, 11:34 AM

## 2018-06-08 NOTE — Progress Notes (Addendum)
[facility] - [place] at [facility]   PATIENT NAME: Jacob Villanueva    MR#:  [id number]  DATE OF BIRTH:  1951-08-05  SUBJECTIVE:  CHIEF COMPLAINT:   Chief Complaint  Patient presents with  . Fall  . Near Syncope   Patient complains of left hip pain. REVIEW OF SYSTEMS:  Review of Systems  Constitutional: Negative for chills, fever and malaise/fatigue.  HENT: Negative for sore throat.   Eyes: Negative for blurred vision and double vision.  Respiratory: Negative for cough, hemoptysis, shortness of breath, wheezing and stridor.   Cardiovascular: Negative for chest pain, palpitations, orthopnea and leg swelling.  Gastrointestinal: Negative for abdominal pain, blood in stool, diarrhea, melena, nausea and vomiting.  Genitourinary: Negative for dysuria, flank pain and hematuria.  Musculoskeletal: Positive for joint pain. Negative for back pain.  Skin: Negative for rash.  Neurological: Negative for dizziness, sensory change, focal weakness, seizures, loss of consciousness, weakness and headaches.  Endo/Heme/Allergies: Negative for polydipsia.  Psychiatric/Behavioral: Negative for depression. The patient is nervous/anxious.     DRUG ALLERGIES:   Allergies  Allergen Reactions  . Bupropion Other (See Comments)    Delusions   . Effexor [Venlafaxine Hcl]     DELUSIONAL   . Amoxicillin Rash  . Clindamycin/Lincomycin Diarrhea   VITALS:  Blood pressure 138/74, pulse 73, temperature 99.4 F (37.4 C), temperature source Oral, resp. rate 17, height 6\' 2"  (1.88 m), weight 90.4 kg, SpO2 94 %. PHYSICAL EXAMINATION:  Physical Exam  Constitutional: He is oriented to person, place, and time. No distress.  HENT:  Head: Normocephalic.  Mouth/Throat: Oropharynx is clear and moist.  Eyes: Pupils are equal, round, and reactive to light. Conjunctivae and EOM are normal. No scleral icterus.  Neck: Normal range of motion. Neck supple. No JVD present. No tracheal deviation present.    Cardiovascular: Normal rate, regular rhythm and normal heart sounds. Exam reveals no gallop.  No murmur heard. Pulmonary/Chest: Effort normal and breath sounds normal. No respiratory distress. He has no wheezes. He has no rales.  Abdominal: Soft. Bowel sounds are normal. He exhibits no distension. There is no tenderness. There is no rebound.  Musculoskeletal: Normal range of motion. He exhibits no edema or tenderness.  Neurological: He is alert and oriented to person, place, and time. No cranial nerve deficit.  Skin: No rash noted. No erythema.   LABORATORY PANEL:  Male CBC Recent Labs  Lab 06/08/18 0400  WBC 8.8  HGB 10.1*  HCT 30.8*  PLT 217   ------------------------------------------------------------------------------------------------------------------ Chemistries  Recent Labs  Lab 06/08/18 0400  NA 138  K 3.9  CL 106  CO2 24  GLUCOSE 142*  BUN 16  CREATININE 0.76  CALCIUM 8.3*   RADIOLOGY:  Dg Hip Operative Unilat W Or W/o Pelvis Left  Result Date: 06/07/2018 CLINICAL DATA:  ORIF LEFT hip EXAM: OPERATIVE LEFT HIP (WITH PELVIS IF PERFORMED) 8 VIEWS TECHNIQUE: Fluoroscopic spot image(s) were submitted for interpretation post-operatively. COMPARISON:  None Correlation: 06/07/2018 FLUOROSCOPY TIME:  1 minutes 40 seconds Images submitted: 8 FINDINGS: Images demonstrate placement of an IM nail with proximal compression screw at the LEFT femur across a partially reduced intertrochanteric fracture of the LEFT femur. Bones demineralized. Hip joint space preserved without dislocation. IMPRESSION: Post ORIF of a mildly displaced intertrochanteric fracture of the LEFT femur. Electronically Signed   By: Ulyses Southward M.D.   On: 06/07/2018 15:11   ASSESSMENT AND PLAN:   67 year old patient with history of closed head injuries, TBI, alcohol  abuse comes from [facility] independent living facility due to fall and noted to have left hip fracture.  #1, acute left femur fracture with  varus impaction, comminuted fracture: POD1, Continue pain medicines prn, DVT prophylaxis. PT: SNF.  2.  Community acquired pneumonia with leukocytosis He was treated with Rocephin, Zithromax.  On Levaquin now.  3.  History of COPD.  Stable, nebulizer as needed. 4.  History of TBI, mood disorder, restart Depakote. 5.  Hypertension: Hypotensive.  Hold Norvasc.  Hypotension improved with IV fluid support. 6.  Small left posterior scalp hematoma due to fall: No neuro deficit.  Anemia, possible due to acute blood loss secondary to acute fracture of surgery and IV fluid dilution.  Follow-up hemoglobin in a.m.  Tobacco abuse.  Smoking cessation was counseled for 3 to 4 minutes.  Nicotine patch.  Alcohol abuse and withdrawal.  start CIWA protocol.  All the records are reviewed and case discussed with Care Management/Social Worker. Management plans discussed with the patient, family and they are in agreement.  CODE STATUS: Full Code  TOTAL TIME TAKING CARE OF THIS PATIENT: 32 minutes.   More than 50% of the time was spent in counseling/coordination of care: YES  POSSIBLE D/C IN 2 DAYS, DEPENDING ON CLINICAL CONDITION.   Shaune Pollack M.D on 06/08/2018 at 2:29 PM  Between 7am to 6pm - Pager - [phone]  After 6pm go to [email or web address] - Therapist, nutritional Hospitalists

## 2018-06-08 NOTE — Clinical Social Work Note (Signed)
Clinical Social Work Assessment  Patient Details  Name: Jacob Villanueva MRN: [id number] Date of Birth: 1951-06-21  Date of referral:  06/08/18               Reason for consult:  Facility Placement                Permission sought to share information with:  Oceanographer granted to share information::  Yes, Verbal Permission Granted  Name::      Skilled Nursing Facility   Agency::   [facility]   Relationship::     Contact Information:     Housing/Transportation Living arrangements for the past 2 months:  Independent Dealer of Information:  Siblings Patient Interpreter Needed:  None Criminal Activity/Legal Involvement Pertinent to Current Situation/Hospitalization:  No - Comment as needed Significant Relationships:  Siblings Lives with:  Self Do you feel safe going back to the place where you live?  Yes Need for family participation in patient care:  Yes (Comment)  Care giving concerns:  Patient lives alone in a studio apartment at [facility] in [place], [place].     Social Worker assessment / plan:  Visual merchandiser (CSW) received SNF consult. PT is recommending SNF. CSW received a phone call from patient's sister/ HPOA Wilburt Finlay [phone]. Per sister patient lives alone and she is his HPOA. Per sister they would like for patient to go to [facility] in [place] and stated that he has been there before. CSW explained that [facility] will have to approve SNF. Sister verbalized her understanding. FL2 complete and faxed out.   CSW presented bed offers to sister. She chose [facility] in [place]. CSW started [facility] SNF authorization through [facility]. Angela admissions coordinator at [facility] is aware of above. CSW will continue to follow and assist as needed.   Employment status:  Retired Database administrator PT Recommendations:  Skilled Nursing  Facility Information / Referral to community resources:  Skilled Nursing Facility  Patient/Family's Response to care:  Patient's sister chose [facility].   Patient/Family's Understanding of and Emotional Response to Diagnosis, Current Treatment, and Prognosis:  Patient's sister was very pleasant and thanked CSW for assistance.   Emotional Assessment Appearance:  Appears stated age Attitude/Demeanor/Rapport:    Affect (typically observed):  Pleasant Orientation:  Oriented to Self, Oriented to Place, Oriented to  Time, Oriented to Situation Alcohol / Substance use:  Not Applicable Psych involvement (Current and /or in the community):  No (Comment)  Discharge Needs  Concerns to be addressed:  Discharge Planning Concerns Readmission within the last 30 days:  No Current discharge risk:  Dependent with Mobility Barriers to Discharge:  Continued Medical Work up   [employer], [name], LCSW 06/08/2018, 11:44 AM

## 2018-06-08 NOTE — Progress Notes (Signed)
Physical Therapy Treatment Patient Details Name: Jacob Villanueva MRN: [id number] DOB: 1950/10/24 Today's Date: 06/08/2018    History of Present Illness Pt is a 67 y.o. male who was admitted to [facility] for L hip IM nailing of a  left femur fracture following a fall from stairs. Pt. has an elevated WBC count with Pneumonia. Pt. 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      PT Comments    Pt continues to present with deficits in strength, transfers, mobility, gait, balance, and activity tolerance and c/o significant L hip pain with movement and weight bearing.  Pt continues to require extensive assistance with all bed mobility tasks and requires verbal cues for sequencing and the EOB elevated to complete sit to stand.  Pt again only able to take several small steps forwards/backwards and to the left with min A for stability and to advance the RW.  Pt continues to perseverate on topics during the session requiring frequent verbal cues to redirect.  During the session +2 physical assistance was present for safety secondary to pt's poor safety awareness and impulsivity.  Pt will benefit from PT services in a SNF setting upon discharge to safely address above deficits for decreased caregiver assistance and eventual return to PLOF.     Follow Up Recommendations  SNF;Supervision for mobility/OOB     Equipment Recommendations  Other (comment)(TBD at next venue of care)    Recommendations for Other Services       Precautions / Restrictions Precautions Precautions: Fall Precaution Comments: Pt impulsive with poor safety awareness Restrictions Weight Bearing Restrictions: Yes LLE Weight Bearing: Weight bearing as tolerated    Mobility  Bed Mobility Overal bed mobility: Needs Assistance Bed Mobility: Supine to Sit;Sit to Supine     Supine to sit: Mod assist Sit to supine: Mod assist   General  bed mobility comments: Mod A for LLE in and out of bed and for trunk to full upright position  Transfers Overall transfer level: Needs assistance Equipment used: Rolling walker (2 wheeled) Transfers: Sit to/from Stand Sit to Stand: From elevated surface;Min guard;+2 safety/equipment            Ambulation/Gait Ambulation/Gait assistance: Min assist;+2 safety/equipment Gait Distance (Feet): 3 Feet Assistive device: Rolling walker (2 wheeled) Gait Pattern/deviations: Step-to pattern;Antalgic;Decreased stance time - left Gait velocity: decreased   General Gait Details: Min A to guide RW during limited amb at the EOB with very antalgic gait on the LLE and heavy reliance on the RW for stability   Stairs             Wheelchair Mobility    Modified Rankin (Stroke Patients Only)       Balance Overall balance assessment: Mild deficits observed, not formally tested                                          Cognition Arousal/Alertness: Awake/alert Behavior During Therapy: Flat affect Overall Cognitive Status: History of cognitive impairments - at baseline                                 General Comments: Pt impulsive with poor STM recall during the session      Exercises Total Joint Exercises Ankle Circles/Pumps:  Strengthening;Both;5 reps;10 reps Quad Sets: Strengthening;Both;5 reps;10 reps Gluteal Sets: Strengthening;Both;10 reps Heel Slides: AROM;Right;5 reps Hip ABduction/ADduction: AAROM;Both;Strengthening;10 reps(AAROM on the LLE) Straight Leg Raises: AAROM;AROM;Both;10 reps(AAROM on the LLE) Long Arc Quad: AROM;Both;5 reps;10 reps Knee Flexion: AROM;Both;5 reps;10 reps Marching in Standing: AROM;Left;5 reps;Standing Other Exercises Other Exercises: Standing L hip SLR x 10 Other Exercises: Standing L hip abd x 10 Other Exercises: Mini squats x 3 with low amplitude    General Comments        Pertinent Vitals/Pain Pain  Assessment: 0-10 Pain Score: 7  Pain Location: L hip Pain Descriptors / Indicators: Aching;Sore Pain Intervention(s): Premedicated before session;Monitored during session;Limited activity within patient's tolerance    Home Living                      Prior Function            PT Goals (current goals can now be found in the care plan section)      Frequency    BID      PT Plan Current plan remains appropriate    Co-evaluation              AM-PAC PT "6 Clicks" Daily Activity  Outcome Measure                   End of Session Equipment Utilized During Treatment: Gait belt Activity Tolerance: Patient limited by pain Patient left: in bed;with bed alarm set;with SCD's reapplied;with call bell/phone within reach;Other (comment)(Mat on floor at bedside) Nurse Communication: Mobility status PT Visit Diagnosis: Unsteadiness on feet (R26.81);Muscle weakness (generalized) (M62.81);Other abnormalities of gait and mobility (R26.89)     Time: 1320-1350 PT Time Calculation (min) (ACUTE ONLY): 30 min  Charges:  $Therapeutic Exercise: 8-22 mins $Therapeutic Activity: 8-22 mins                     D. Scott Keneth Borg PT, DPT 06/08/18, 3:38 PM

## 2018-06-09 LAB — GLUCOSE, CAPILLARY: Glucose-Capillary: 113 mg/dL — ABNORMAL HIGH (ref 70–99)

## 2018-06-09 LAB — HEMOGLOBIN: HEMOGLOBIN: 9.5 g/dL — AB (ref 13.0–17.0)

## 2018-06-09 MED ORDER — HYDROCODONE-ACETAMINOPHEN 5-325 MG PO TABS: 1.0000 | ORAL_TABLET | ORAL | 0 refills | Status: DC | PRN

## 2018-06-09 MED ORDER — TRAMADOL HCL 50 MG PO TABS: 50.0000 mg | ORAL_TABLET | Freq: Four times a day (QID) | ORAL | 1 refills | Status: DC

## 2018-06-09 MED ORDER — ASPIRIN 325 MG PO TBEC: 325.0000 mg | DELAYED_RELEASE_TABLET | Freq: Every day | ORAL | 0 refills | Status: DC

## 2018-06-09 NOTE — Plan of Care (Signed)

## 2018-06-09 NOTE — Progress Notes (Signed)
   Subjective: 2 Days Post-Op Procedure(s) (LRB): INTRAMEDULLARY (IM) NAIL INTERTROCHANTRIC (Left) Patient reports pain as moderate.   Patient is well, and has had no acute complaints or problems.  States he wants to get up and walk but no longer let him walk. Denies any CP, SOB, ABD pain. We will start physical therapy today.  Complaining of nausea and a feeling of vomiting.  Objective: Vital signs in last 24 hours: Temp:  [98.2 F (36.8 C)-99.4 F (37.4 C)] 98.5 F (36.9 C) (10/25 2327) Pulse Rate:  [62-83] 62 (10/25 2327) Resp:  [16] 16 (10/25 2327) BP: (121-140)/(68-82) 121/68 (10/25 2327) SpO2:  [93 %-95 %] 93 % (10/25 2327) Weight:  [90.6 kg] 90.6 kg (10/26 0500)  Intake/Output from previous day: 10/25 0701 - 10/26 0700 In: 480 [P.O.:480] Out: 350 [Urine:350] Intake/Output this shift: No intake/output data recorded.  Recent Labs    06/07/18 0814 06/08/18 0400 06/09/18 0447  HGB 13.8 10.1* 9.5*   Recent Labs    06/07/18 0814 06/08/18 0400  WBC 16.6* 8.8  RBC 4.35 3.21*  HCT 41.4 30.8*  PLT 272 217   Recent Labs    06/07/18 0814 06/08/18 0400  NA 138 138  K 4.3 3.9  CL 103 106  CO2 26 24  BUN 13 16  CREATININE 0.89 0.76  GLUCOSE 107* 142*  CALCIUM 9.3 8.3*   No results for input(s): LABPT, INR in the last 72 hours.  EXAM General - Patient is Alert, Appropriate and Oriented Extremity - Neurovascular intact Sensation intact distally Intact pulses distally Dorsiflexion/Plantar flexion intact No cellulitis present Compartment soft Dressing - dressing C/D/I and no drainage Motor Function - intact, moving foot and toes well on exam.   Past Medical History:  Diagnosis Date  . Bacteremia 01/2017  . Hypertension   . TBI (traumatic brain injury) (HCC)     Assessment/Plan:   2 Days Post-Op Procedure(s) (LRB): INTRAMEDULLARY (IM) NAIL INTERTROCHANTRIC (Left) Active Problems:   Fracture of femoral neck, left, closed (HCC)  Estimated body mass  index is 25.64 kg/m as calculated from the following:   Height as of this encounter: 6\' 2"  (1.88 m).   Weight as of this encounter: 90.6 kg. Advance diet Up with therapy  Needs bowel movement Labs are stable Care management to assist with discharge  DVT Prophylaxis - Aspirin, TED hose and SCDs Weight-Bearing as tolerated to left leg   Dedra Skeens PA-C [facility] 06/09/2018, 7:03 AM

## 2018-06-09 NOTE — Progress Notes (Signed)
[employer] [facility]   PATIENT NAME: Jacob Villanueva    MR#:  [id number]  DATE OF BIRTH:  Jan 04, 1951  SUBJECTIVE:  CHIEF COMPLAINT:   Chief Complaint  Patient presents with  . Fall  . Near Syncope   Patient complains of left hip pain. REVIEW OF SYSTEMS:  Review of Systems  Constitutional: Negative for chills, fever and malaise/fatigue.  HENT: Negative for sore throat.   Eyes: Negative for blurred vision and double vision.  Respiratory: Negative for cough, hemoptysis, shortness of breath, wheezing and stridor.   Cardiovascular: Negative for chest pain, palpitations, orthopnea and leg swelling.  Gastrointestinal: Negative for abdominal pain, blood in stool, diarrhea, melena, nausea and vomiting.  Genitourinary: Negative for dysuria, flank pain and hematuria.  Musculoskeletal: Positive for joint pain. Negative for back pain.  Skin: Negative for rash.  Neurological: Negative for dizziness, sensory change, focal weakness, seizures, loss of consciousness, weakness and headaches.  Endo/Heme/Allergies: Negative for polydipsia.  Psychiatric/Behavioral: Negative for depression. The patient is not nervous/anxious.     DRUG ALLERGIES:   Allergies  Allergen Reactions  . Bupropion Other (See Comments)    Delusions   . Effexor [Venlafaxine Hcl]     DELUSIONAL   . Amoxicillin Rash  . Clindamycin/Lincomycin Diarrhea   VITALS:  Blood pressure 120/72, pulse 63, temperature 98.9 F (37.2 C), temperature source Oral, resp. rate 16, height 6\' 2"  (1.88 m), weight 90.6 kg, SpO2 95 %. PHYSICAL EXAMINATION:  Physical Exam  Constitutional: He is oriented to person, place, and time. No distress.  HENT:  Head: Normocephalic.  Mouth/Throat: Oropharynx is clear and moist.  Eyes: Pupils are equal, round, and reactive to light. Conjunctivae and EOM are normal. No scleral icterus.  Neck: Normal range of motion. Neck supple. No JVD present. No tracheal deviation present.   Cardiovascular: Normal rate, regular rhythm and normal heart sounds. Exam reveals no gallop.  No murmur heard. Pulmonary/Chest: Effort normal and breath sounds normal. No respiratory distress. He has no wheezes. He has no rales.  Abdominal: Soft. Bowel sounds are normal. He exhibits no distension. There is no tenderness. There is no rebound.  Musculoskeletal: He exhibits no edema or tenderness.  Left lower limb activity limited due to pain.  Neurological: He is alert and oriented to person, place, and time. No cranial nerve deficit.  Skin: No rash noted. No erythema.   LABORATORY PANEL:  Male CBC Recent Labs  Lab 06/08/18 0400 06/09/18 0447  WBC 8.8  --   HGB 10.1* 9.5*  HCT 30.8*  --   PLT 217  --    ------------------------------------------------------------------------------------------------------------------ Chemistries  Recent Labs  Lab 06/08/18 0400  NA 138  K 3.9  CL 106  CO2 24  GLUCOSE 142*  BUN 16  CREATININE 0.76  CALCIUM 8.3*   RADIOLOGY:  No results found. ASSESSMENT AND PLAN:   67 year old patient with history of closed head injuries, TBI, alcohol abuse comes from [facility] independent living facility due to fall and noted to have left hip fracture.  1, acute left femur fracture with varus impaction, comminuted fracture: POD1- IM nailing, Continue pain medicines prn, DVT prophylaxis. PT: SNF.  2.  Community acquired pneumonia with leukocytosis He was treated with Rocephin, Zithromax.  On Levaquin now.  3.  History of COPD.  Stable, nebulizer as needed. 4.  History of TBI, mood disorder, restart Depakote. 5.  Hypertension: Hypotensive.  Hold Norvasc.  Hypotension improved with IV fluid support. 6.  Small left  posterior scalp hematoma due to fall: No neuro deficit. 7. Anemia, possible due to acute blood loss secondary to acute fracture of surgery and IV fluid dilution.  Follow-up hemoglobin in a.m. relatively stable.  8.Tobacco abuse.  Smoking  cessation was counseled for 3 to 4 minutes.  Nicotine patch.  9.Alcohol abuse and withdrawal.  start CIWA protocol. Stable now.  All the records are reviewed and case discussed with Care Management/Social Worker. Management plans discussed with the patient, family and they are in agreement.  CODE STATUS: Full Code  TOTAL TIME TAKING CARE OF THIS PATIENT: 32 minutes.   More than 50% of the time was spent in counseling/coordination of care: YES  POSSIBLE D/C IN 2 DAYS, DEPENDING ON CLINICAL CONDITION.   Altamese Dilling M.D on 06/09/2018 at 1:28 PM  Between 7am to 6pm - Pager - [phone]  After 6pm go to [email or web address] - Therapist, nutritional Hospitalists

## 2018-06-09 NOTE — Progress Notes (Signed)
Physical Therapy Treatment Patient Details Name: Jacob Villanueva MRN: [id number] DOB: [birth year] Today's Date: 06/09/2018    History of Present Illness Pt is a 67 y.o. male who was admitted to [facility] for L hip IM nailing of a  left femur fracture following a fall from stairs. Pt. has an elevated WBC count with Pneumonia. Pt. 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      PT Comments    Pt returned to bed with nursing staff.  Participated in exercises as described below.  Pt requesting pain medication and relayed to nursing.  Encouraged further out of bed gait but he refused.  Will continue as appropriate.   Follow Up Recommendations  SNF     Equipment Recommendations       Recommendations for Other Services       Precautions / Restrictions Precautions Precautions: Fall Precaution Comments: Pt impulsive with poor safety awareness Restrictions Weight Bearing Restrictions: Yes LLE Weight Bearing: Weight bearing as tolerated    Mobility  Bed Mobility                  Transfers                    Ambulation/Gait                 Stairs             Wheelchair Mobility    Modified Rankin (Stroke Patients Only)       Balance                                            Cognition Arousal/Alertness: Awake/alert Behavior During Therapy: Flat affect;Anxious Overall Cognitive Status: History of cognitive impairments - at baseline                                 General Comments: Pt impulsive with poor STM recall during the session      Exercises Total Joint Exercises Ankle Circles/Pumps: Strengthening;Both;5 reps;10 reps Quad Sets: Strengthening;Both;5 reps;10 reps Gluteal Sets: Strengthening;Both;10 reps Heel Slides: AROM;Right;5 reps Hip ABduction/ADduction: AAROM;Both;Strengthening;10 reps Straight Leg  Raises: AAROM;AROM;Both;10 reps    General Comments        Pertinent Vitals/Pain Pain Assessment: 0-10 Pain Score: 10-Worst pain ever Pain Location: L hip Pain Descriptors / Indicators: Aching;Sore Pain Intervention(s): Limited activity within patient's tolerance;Monitored during session    Home Living                      Prior Function            PT Goals (current goals can now be found in the care plan section) Progress towards PT goals: Progressing toward goals    Frequency    BID      PT Plan Current plan remains appropriate    Co-evaluation              AM-PAC PT "6 Clicks" Daily Activity  Outcome Measure  Difficulty turning over in bed (including adjusting bedclothes, sheets and blankets)?: Unable Difficulty moving from lying on back to sitting on the side of the bed? : Unable Difficulty sitting down on and standing up from a chair with  arms (e.g., wheelchair, bedside commode, etc,.)?: Unable Help needed moving to and from a bed to chair (including a wheelchair)?: A Lot Help needed walking in hospital room?: Total Help needed climbing 3-5 steps with a railing? : Total 6 Click Score: 7    End of Session Equipment Utilized During Treatment: Gait belt Activity Tolerance: Patient limited by pain Patient left: in bed;with bed alarm set;with call bell/phone within reach Nurse Communication: Patient requests pain meds       Time: 1300-1311 PT Time Calculation (min) (ACUTE ONLY): 11 min  Charges:  $Therapeutic Exercise: 8-22 mins                    Danielle Dess, PTA 06/09/18, 4:15 PM

## 2018-06-09 NOTE — Plan of Care (Signed)
Patient will benefit from ongoing skilled OT services in acute care setting to continue to advance safe functional mobility, self care independence, and minimize fall risk.

## 2018-06-09 NOTE — Progress Notes (Signed)
Occupational Therapy Treatment Patient Details Name: Jacob Villanueva MRN: [id number] DOB: 03/03/1951 Today's Date: 06/09/2018    History of present illness Pt is a 67 y.o. male who was admitted to [facility] for L hip IM nailing of a  left femur fracture following a fall from stairs. Pt. has an elevated WBC count with Pneumonia. Pt. 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     OT comments  OT went to see soon after PT session. PT stated worked with Nursing to get patient up to chair. Patient had recently had pain medication. Patient was willing to work with OT. Kept perseverating on need for pain medications and was confused to how long he had been up in recliner. Patient educated on use of AE for LB dressing. Patient did attempt, but stated he would not need. Educated patient that hip surgery would limit reaching of BLE. Patient noted to have decreased safety awareness. Would benefit from continued OT services for education and training for increased safety and independence with ADL tasks.   Follow Up Recommendations  SNF    Equipment Recommendations       Recommendations for Other Services      Precautions / Restrictions Precautions Precautions: Fall Precaution Comments: Pt impulsive with poor safety awareness Restrictions Weight Bearing Restrictions: Yes LLE Weight Bearing: Weight bearing as tolerated       Mobility Bed Mobility                  Transfers                      Balance                                           ADL either performed or assessed with clinical judgement   ADL Overall ADL's : Needs assistance/impaired               Lower Body Bathing Details (indicate cue type and reason): Donn/doff sock with AE and MIN A from recliner level. Refused to stand and perform additional tasks. perseverated throughout session on  neeeding more pain meds.                       General ADL Comments: Patient education was provided on AE use for LB dressing.     Vision Baseline Vision/History: Wears glasses Wears Glasses: At all times Patient Visual Report: No change from baseline     Perception     Praxis      Cognition Arousal/Alertness: Awake/alert Behavior During Therapy: Flat affect;Anxious Overall Cognitive Status: History of cognitive impairments - at baseline                                 General Comments: Pt impulsive with poor STM recall during the session. Needed frequent redirection.         Exercises Total Joint Exercises Ankle Circles/Pumps: Strengthening;Both;5 reps;10 reps Quad Sets: Strengthening;Both;5 reps;10 reps Gluteal Sets: Strengthening;Both;10 reps Heel Slides: AROM;Right;5 reps Hip ABduction/ADduction: AAROM;Both;Strengthening;10 reps Straight Leg Raises: AAROM;AROM;Both;10 reps   Shoulder Instructions       General Comments      Pertinent Vitals/ Pain  Pain Assessment: 0-10 Pain Score: 8  Pain Location: L hip Pain Descriptors / Indicators: Aching;Sore Pain Intervention(s): Limited activity within patient's tolerance;Monitored during session;Patient requesting pain meds-RN notified  Home Living                                          Prior Functioning/Environment              Frequency  Min 2X/week        Progress Toward Goals  OT Goals(current goals can now be found in the care plan section)  Progress towards OT goals: Progressing toward goals  Acute Rehab OT Goals Patient Stated Goal: To be indepedent OT Goal Formulation: With patient Potential to Achieve Goals: Good  Plan Discharge plan remains appropriate;Frequency remains appropriate    Co-evaluation                 AM-PAC PT "6 Clicks" Daily Activity     Outcome Measure                    End of Session    OT Visit  Diagnosis: Muscle weakness (generalized) (M62.81)   Activity Tolerance Patient limited by pain   Patient Left in chair;with chair alarm set;with call bell/phone within reach   Nurse Communication Patient requests pain meds        Time: 1045-1100 OT Time Calculation (min): 15 min  Charges: OT General Charges $OT Visit: 1 Visit OT Treatments $Self Care/Home Management : 8-22 mins  [name], OTR/L [name] L 06/09/2018, 5:45 PM

## 2018-06-09 NOTE — Progress Notes (Signed)
Physical Therapy Treatment Patient Details Name: Jacob Villanueva MRN: [id number] DOB: 1950-11-09 Today's Date: 06/09/2018    History of Present Illness Pt is a 67 y.o. male who was admitted to [facility] for L hip IM nailing of a  left femur fracture following a fall from stairs. Pt. has an elevated WBC count with Pneumonia. Pt. 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      PT Comments    Pt in bed, inc urine and requiring full bed bath and linen change.  Requesting pain medication.  Nurse tech in to assist with care and bathing.  Max a x 1 rolling left and right.  He required mod a x 1 to get to edge of bed where he was able to sit with supervision.  Stood with min a x 2 with overall poor transfer to chair and sat sideways in chair before fully turning.  Pt stating multiple times regarding pain and request for medication. Reassured pt each time nurse was getting medication for him. Primary nurse in and giving pain medication at end of session.  He remained in recliner but overall limited by cognition and perseveration on pain.   Follow Up Recommendations  SNF     Equipment Recommendations       Recommendations for Other Services       Precautions / Restrictions Precautions Precautions: Fall Precaution Comments: Pt impulsive with poor safety awareness Restrictions Weight Bearing Restrictions: Yes LLE Weight Bearing: Weight bearing as tolerated    Mobility  Bed Mobility Overal bed mobility: Needs Assistance Bed Mobility: Rolling Rolling: Max assist            Transfers Overall transfer level: Needs assistance Equipment used: Rolling walker (2 wheeled) Transfers: Sit to/from Stand Sit to Stand: From elevated surface;+2 safety/equipment;Min assist            Ambulation/Gait Ambulation/Gait assistance: Min assist;+2 safety/equipment Gait Distance (Feet): 2 Feet Assistive  device: Rolling walker (2 wheeled) Gait Pattern/deviations: Step-to pattern;Narrow base of support Gait velocity: decreased   General Gait Details: poor steps to recliner at bedside and at the end sitting sideways into chair before fully turning   Stairs             Wheelchair Mobility    Modified Rankin (Stroke Patients Only)       Balance Overall balance assessment: Needs assistance Sitting-balance support: Feet supported Sitting balance-Leahy Scale: Fair     Standing balance support: Bilateral upper extremity supported Standing balance-Leahy Scale: Poor                              Cognition Arousal/Alertness: Awake/alert Behavior During Therapy: Flat affect;Anxious Overall Cognitive Status: History of cognitive impairments - at baseline                                 General Comments: Pt impulsive with poor STM recall during the session      Exercises Other Exercises Other Exercises: Pt inc urine.  Assisted nurse tech with bathing and care prior to getting out of bed.      General Comments        Pertinent Vitals/Pain Pain Assessment: 0-10 Pain Score: 10-Worst pain ever Pain Location: L hip Pain Descriptors / Indicators: Aching;Sore Pain Intervention(s): Patient requesting pain meds-RN  notified;Limited activity within patient's tolerance;Monitored during session    Home Living                      Prior Function            PT Goals (current goals can now be found in the care plan section) Progress towards PT goals: Progressing toward goals    Frequency    BID      PT Plan Current plan remains appropriate    Co-evaluation              AM-PAC PT "6 Clicks" Daily Activity  Outcome Measure  Difficulty turning over in bed (including adjusting bedclothes, sheets and blankets)?: Unable Difficulty moving from lying on back to sitting on the side of the bed? : Unable Difficulty sitting down on and  standing up from a chair with arms (e.g., wheelchair, bedside commode, etc,.)?: Unable Help needed moving to and from a bed to chair (including a wheelchair)?: A Lot Help needed walking in hospital room?: Total Help needed climbing 3-5 steps with a railing? : Total 6 Click Score: 7    End of Session Equipment Utilized During Treatment: Gait belt Activity Tolerance: Patient limited by pain Patient left: in chair;with call bell/phone within reach;with chair alarm set;with nursing/sitter in room Nurse Communication: Patient requests pain meds       Time: 0922-0940 PT Time Calculation (min) (ACUTE ONLY): 18 min  Charges:  $Therapeutic Activity: 8-22 mins                     Danielle Dess, PTA 06/09/18, 9:56 AM

## 2018-06-09 NOTE — Discharge Instructions (Signed)
INSTRUCTIONS AFTER Surgery  o Remove items at home which could result in a fall. This includes throw rugs or furniture in walking pathways o ICE to the affected joint every three hours while awake for 30 minutes at a time, for at least the first 3-5 days, and then as needed for pain and swelling.  Continue to use ice for pain and swelling. You may notice swelling that will progress down to the foot and ankle.  This is normal after surgery.  Elevate your leg when you are not up walking on it.   o Continue to use the breathing machine you got in the hospital (incentive spirometer) which will help keep your temperature down.  It is common for your temperature to cycle up and down following surgery, especially at night when you are not up moving around and exerting yourself.  The breathing machine keeps your lungs expanded and your temperature down.   DIET:  As you were doing prior to hospitalization, we recommend a well-balanced diet.  DRESSING / WOUND CARE / SHOWERING  Dressing change as needed.  Able to get wet with waterproof bandages.  Staples will be removed in 2 weeks and orthopedics.  ACTIVITY  o Increase activity slowly as tolerated, but follow the weight bearing instructions below.   o No driving for 6 weeks or until further direction given by your physician.  You cannot drive while taking narcotics.  o No lifting or carrying greater than 10 lbs. until further directed by your surgeon. o Avoid periods of inactivity such as sitting longer than an hour when not asleep. This helps prevent blood clots.  o You may return to work once you are authorized by your doctor.     WEIGHT BEARING  Weightbearing as tolerated on the left   EXERCISES Gait training and range of motion with strengthening.  Balance training.  CONSTIPATION  Constipation is defined medically as fewer than three stools per week and severe constipation as less than one stool per week.  Even if you have a regular bowel  pattern at home, your normal regimen is likely to be disrupted due to multiple reasons following surgery.  Combination of anesthesia, postoperative narcotics, change in appetite and fluid intake all can affect your bowels.   YOU MUST use at least one of the following options; they are listed in order of increasing strength to get the job done.  They are all available over the counter, and you may need to use some, POSSIBLY even all of these options:    Drink plenty of fluids (prune juice may be helpful) and high fiber foods Colace 100 mg by mouth twice a day  Senokot for constipation as directed and as needed Dulcolax (bisacodyl), take with full glass of water  Miralax (polyethylene glycol) once or twice a day as needed.  If you have tried all these things and are unable to have a bowel movement in the first 3-4 days after surgery call either your surgeon or your primary doctor.    If you experience loose stools or diarrhea, hold the medications until you stool forms back up.  If your symptoms do not get better within 1 week or if they get worse, check with your doctor.  If you experience "the worst abdominal pain ever" or develop nausea or vomiting, please contact the office immediately for further recommendations for treatment.   ITCHING:  If you experience itching with your medications, try taking only a single pain pill, or even half  a pain pill at a time.  You can also use Benadryl over the counter for itching or also to help with sleep.   TED HOSE STOCKINGS:  Use stockings on both legs until for at least 2 weeks or as directed by physician office. They may be removed at night for sleeping.  MEDICATIONS:  See your medication summary on the After Visit Summary that nursing will review with you.  You may have some home medications which will be placed on hold until you complete the course of blood thinner medication.  It is important for you to complete the blood thinner medication as  prescribed.  PRECAUTIONS:  If you experience chest pain or shortness of breath - call 911 immediately for transfer to the hospital emergency department.   If you develop a fever greater that 101 F, purulent drainage from wound, increased redness or drainage from wound, foul odor from the wound/dressing, or calf pain - CONTACT YOUR SURGEON.                                                   FOLLOW-UP APPOINTMENTS:  If you do not already have a post-op appointment, please call the office for an appointment to be seen by your surgeon.  Guidelines for how soon to be seen are listed in your After Visit Summary, but are typically between 1-4 weeks after surgery.  OTHER INSTRUCTIONS:     MAKE SURE YOU:   Understand these instructions.   Get help right away if you are not doing well or get worse.    Thank you for letting us be a part of your medical care team.  It is a privilege we respect greatly.  We hope these instructions will help you stay on track for a fast and full recovery!

## 2018-06-10 LAB — CBC
HCT: 28.8 % — ABNORMAL LOW (ref 39.0–52.0)
HEMOGLOBIN: 9.8 g/dL — AB (ref 13.0–17.0)
MCH: 31.8 pg (ref 26.0–34.0)
MCHC: 34 g/dL (ref 30.0–36.0)
MCV: 93.5 fL (ref 80.0–100.0)
NRBC: 0 % (ref 0.0–0.2)
Platelets: 250 10*3/uL (ref 150–400)
RBC: 3.08 MIL/uL — AB (ref 4.22–5.81)
RDW: 12 % (ref 11.5–15.5)
WBC: 9.5 10*3/uL (ref 4.0–10.5)

## 2018-06-10 LAB — GLUCOSE, CAPILLARY: GLUCOSE-CAPILLARY: 114 mg/dL — AB (ref 70–99)

## 2018-06-10 MED ORDER — NICOTINE 14 MG/24HR TD PT24: 14.0000 mg | MEDICATED_PATCH | Freq: Every day | TRANSDERMAL | 0 refills | Status: DC

## 2018-06-10 MED ORDER — THIAMINE HCL 100 MG PO TABS: 100.0000 mg | ORAL_TABLET | Freq: Every day | ORAL | 0 refills | Status: DC

## 2018-06-10 MED ORDER — LEVOFLOXACIN 500 MG PO TABS: 500.0000 mg | ORAL_TABLET | Freq: Every day | ORAL | 0 refills | Status: AC

## 2018-06-10 MED ORDER — FOLIC ACID 1 MG PO TABS: 1.0000 mg | ORAL_TABLET | Freq: Every day | ORAL | 0 refills | Status: DC

## 2018-06-10 MED ORDER — DOCUSATE SODIUM 100 MG PO CAPS: 100.0000 mg | ORAL_CAPSULE | Freq: Two times a day (BID) | ORAL | 0 refills | Status: DC

## 2018-06-10 MED ORDER — ADULT MULTIVITAMIN W/MINERALS CH: 1.0000 | ORAL_TABLET | Freq: Every day | ORAL | 0 refills | Status: DC

## 2018-06-10 MED ORDER — BISACODYL 5 MG PO TBEC: 5.0000 mg | DELAYED_RELEASE_TABLET | Freq: Every day | ORAL | 0 refills | Status: DC | PRN

## 2018-06-10 MED ORDER — TRAZODONE HCL 50 MG PO TABS: 25.0000 mg | ORAL_TABLET | Freq: Every evening | ORAL | 0 refills | Status: AC | PRN

## 2018-06-10 MED ORDER — SERTRALINE HCL 50 MG PO TABS: 50.0000 mg | ORAL_TABLET | Freq: Every morning | ORAL | 0 refills | Status: DC

## 2018-06-10 MED ORDER — METHOCARBAMOL 500 MG PO TABS: 500.0000 mg | ORAL_TABLET | Freq: Four times a day (QID) | ORAL | 0 refills | Status: DC | PRN

## 2018-06-10 MED ORDER — MENTHOL 3 MG MT LOZG: 1.0000 | LOZENGE | OROMUCOSAL | 12 refills | Status: DC | PRN

## 2018-06-10 NOTE — Progress Notes (Signed)
Patient is being discharged to [facility]. Report called. Signed scripts in packet. Sister at bedside. EMS at bedside. Sister at bedside.

## 2018-06-10 NOTE — Clinical Social Work Note (Signed)
The patient will discharge today to [facility] via non-emergent EMS. The patient's sister and facility Annabelle Harman, supervisor) are aware and in agreement. The CSW has sent all needed documentation to the facility and has delivered the discharge packet to the chart including all hard scripts. The CSW is signing off. Please consult should needs arise.  Argentina Ponder, MSW, [facility] [phone]

## 2018-06-10 NOTE — Progress Notes (Signed)
   Subjective: 3 Days Post-Op Procedure(s) (LRB): INTRAMEDULLARY (IM) NAIL INTERTROCHANTRIC (Left) Patient reports pain as moderate.   Patient is well, and has had no acute complaints or problems.  States he wants to get up and walk but no longer let him walk. Denies any CP, SOB, ABD pain. We will start physical therapy today.  No nausea or vomiting  Objective: Vital signs in last 24 hours: Temp:  [97.8 F (36.6 C)-98.4 F (36.9 C)] 98.4 F (36.9 C) (10/27 0750) Pulse Rate:  [70-75] 75 (10/27 0750) Resp:  [18-20] 18 (10/26 2347) BP: (123-133)/(70-90) 123/90 (10/27 0750) SpO2:  [93 %-95 %] 94 % (10/27 0750) Weight:  [90.7 kg] 90.7 kg (10/27 0500)  Intake/Output from previous day: 10/26 0701 - 10/27 0700 In: 240 [P.O.:240] Out: 200 [Urine:200] Intake/Output this shift: No intake/output data recorded.  Recent Labs    06/08/18 0400 06/09/18 0447  HGB 10.1* 9.5*   Recent Labs    06/08/18 0400  WBC 8.8  RBC 3.21*  HCT 30.8*  PLT 217   Recent Labs    06/08/18 0400  NA 138  K 3.9  CL 106  CO2 24  BUN 16  CREATININE 0.76  GLUCOSE 142*  CALCIUM 8.3*   No results for input(s): LABPT, INR in the last 72 hours.  EXAM General - Patient is Alert, Appropriate and Oriented Extremity - Neurovascular intact Sensation intact distally Intact pulses distally Dorsiflexion/Plantar flexion intact No cellulitis present Compartment soft Dressing - dressing C/D/I and no drainage Motor Function - intact, moving foot and toes well on exam.   Past Medical History:  Diagnosis Date  . Bacteremia 01/2017  . Hypertension   . TBI (traumatic brain injury) (HCC)     Assessment/Plan:   3 Days Post-Op Procedure(s) (LRB): INTRAMEDULLARY (IM) NAIL INTERTROCHANTRIC (Left) Active Problems:   Fracture of femoral neck, left, closed (HCC)  Estimated body mass index is 25.68 kg/m as calculated from the following:   Height as of this encounter: 6\' 2"  (1.88 m).   Weight as of this  encounter: 90.7 kg. Advance diet Up with therapy   Labs are stable Care management to assist with discharge  DVT Prophylaxis - Aspirin, TED hose and SCDs Weight-Bearing as tolerated to left leg   Dedra Skeens PA-C [facility] 06/10/2018, 8:26 AM

## 2018-06-10 NOTE — Discharge Summary (Signed)
[facility] [place] [facility]   PATIENT NAME: Jacob Villanueva    MR#:  [id number]  DATE OF BIRTH:  [birth year]  DATE OF ADMISSION:  06/07/2018 ADMITTING PHYSICIAN: Katha Hamming, MD  DATE OF DISCHARGE: 06/10/2018   PRIMARY CARE PHYSICIAN: [facility]    ADMISSION DIAGNOSIS:  Surgery, elective [Z41.9] Closed fracture of left hip, initial encounter (HCC) [S72.002A] Fall, initial encounter [W19.XXXA] Leukocytosis, unspecified type [D72.829]  DISCHARGE DIAGNOSIS:  Active Problems:   Fracture of femoral neck, left, closed (HCC)   SECONDARY DIAGNOSIS:   Past Medical History:  Diagnosis Date  . Bacteremia 01/2017  . Hypertension   . TBI (traumatic brain injury) [facility])     HOSPITAL COURSE:   67 year old patient with history of closed head injuries, TBI, alcohol abuse comes from [facility] independent living facility due to fall and noted to have left hip fracture.  1,acute left femur fracture with varus impaction, comminuted fracture: POD 3 - IM nailing, Continue pain medicines prn, DVT prophylaxis. PT: SNF.  2. Community acquired pneumonia with leukocytosis He was treated with Rocephin, Zithromax.  On Levaquin now.  give 3 more days on discharge.  3. History of COPD.  Stable, nebulizer as needed. 4. History of TBI, mood disorder, follow as out pt. 5. Hypertension: Hypotensive. Hold Norvasc.  Hypotension improved with IV fluid support. Stable now. 6. Small left posterior scalp hematoma due to fall: No neuro deficit. 7. Anemia, possible due to acute blood loss secondary to acute fracture of surgery and IV fluid dilution.  relatively stable.  8.Tobacco abuse.  Smoking cessation was counseled for 3 to 4 minutes.  Nicotine patch.  9.Alcohol abuse and withdrawal.  start CIWA protocol. Stable now.  No withdrawal signs now.   DISCHARGE CONDITIONS:   Stable.  CONSULTS OBTAINED:  Treatment Team:  Kennedy Bucker,  MD  DRUG ALLERGIES:   Allergies  Allergen Reactions  . Bupropion Other (See Comments)    Delusions   . Effexor [Venlafaxine Hcl]     DELUSIONAL   . Amoxicillin Rash  . Clindamycin/Lincomycin Diarrhea    DISCHARGE MEDICATIONS:   Allergies as of 06/10/2018      Reactions   Bupropion Other (See Comments)   Delusions    Effexor [venlafaxine Hcl]    DELUSIONAL   Amoxicillin Rash   Clindamycin/lincomycin Diarrhea      Medication List    STOP taking these medications   amLODipine 5 MG tablet Commonly known as:  NORVASC   gabapentin 300 MG capsule Commonly known as:  NEURONTIN   ibuprofen 200 MG tablet Commonly known as:  ADVIL,MOTRIN   ipratropium-albuterol 0.5-2.5 (3) MG/3ML Soln Commonly known as:  DUONEB   magnesium oxide 400 (241.3 Mg) MG tablet Commonly known as:  MAG-OX   NON FORMULARY     TAKE these medications   aspirin 325 MG EC tablet Take 1 tablet (325 mg total) by mouth daily with breakfast.   bisacodyl 5 MG EC tablet Commonly known as:  DULCOLAX Take 1 tablet (5 mg total) by mouth daily as needed for moderate constipation.   calcium carbonate 750 MG chewable tablet Commonly known as:  TUMS EX Chew 1 tablet by mouth as needed.   docusate sodium 100 MG capsule Commonly known as:  COLACE Take 1 capsule (100 mg total) by mouth 2 (two) times daily.   folic acid 1 MG tablet Commonly known as:  FOLVITE Take 1 tablet (1 mg total) by mouth daily. Start taking on:  06/11/2018   HYDROcodone-acetaminophen 5-325 MG tablet Commonly known as:  NORCO/VICODIN Take 1-2 tablets by mouth every 4 (four) hours as needed for moderate pain (pain score 4-6).   levofloxacin 500 MG tablet Commonly known as:  LEVAQUIN Take 1 tablet (500 mg total) by mouth daily for 3 days. Start taking on:  06/11/2018   menthol-cetylpyridinium 3 MG lozenge Commonly known as:  CEPACOL Take 1 lozenge (3 mg total) by mouth as needed for sore throat (sore throat).    methocarbamol 500 MG tablet Commonly known as:  ROBAXIN Take 1 tablet (500 mg total) by mouth every 6 (six) hours as needed for muscle spasms.   multivitamin with minerals Tabs tablet Take 1 tablet by mouth daily. Start taking on:  06/11/2018   nicotine 14 mg/24hr patch Commonly known as:  NICODERM CQ - dosed in mg/24 hours Place 1 patch (14 mg total) onto the skin daily. Start taking on:  06/11/2018   sertraline 50 MG tablet Commonly known as:  ZOLOFT Take 1 tablet (50 mg total) by mouth every morning.   thiamine 100 MG tablet Take 1 tablet (100 mg total) by mouth daily. Start taking on:  06/11/2018   traMADol 50 MG tablet Commonly known as:  ULTRAM Take 1 tablet (50 mg total) by mouth every 6 (six) hours.   traZODone 50 MG tablet Commonly known as:  DESYREL Take 0.5 tablets (25 mg total) by mouth at bedtime as needed for sleep.        DISCHARGE INSTRUCTIONS:    Follow with Ortho clinic in 2 weeks.  If you experience worsening of your admission symptoms, develop shortness of breath, life threatening emergency, suicidal or homicidal thoughts you must seek medical attention immediately by calling 911 or calling your MD immediately  if symptoms less severe.  You Must read complete instructions/literature along with all the possible adverse reactions/side effects for all the Medicines you take and that have been prescribed to you. Take any new Medicines after you have completely understood and accept all the possible adverse reactions/side effects.   Please note  You were cared for by a hospitalist during your hospital stay. If you have any questions about your discharge medications or the care you received while you were in the hospital after you are discharged, you can call the unit and asked to speak with the hospitalist on call if the hospitalist that took care of you is not available. Once you are discharged, your primary care physician will handle any further medical  issues. Please note that NO REFILLS for any discharge medications will be authorized once you are discharged, as it is imperative that you return to your primary care physician (or establish a relationship with a primary care physician if you do not have one) for your aftercare needs so that they can reassess your need for medications and monitor your lab values.    Today   CHIEF COMPLAINT:   Chief Complaint  Patient presents with  . Fall  . Near Syncope    HISTORY OF PRESENT ILLNESS:  Jacob Villanueva  is a 67 y.o. 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 and has been having left hip pain noted to have left femur fracture.  Has been having left hip pain.  Patient is at [facility] for long time.  According  to patient's niece patient is not taking any medicines for high blood pressure or mood for the past 1 to 2 years.  Patient noted to have slight hypotension BP 94/70 5-year.  He denies complaints.  No chest pain but has been having some cough lately.  WBC elevated to 16.6 and chest x-ray showed left base pneumonia.pt felt   Light headed   VITAL SIGNS:  Blood pressure 123/90, pulse 75, temperature 98.4 F (36.9 C), temperature source Oral, resp. rate 17, height 6\' 2"  (1.88 m), weight 90.7 kg, SpO2 94 %.  I/O:    Intake/Output Summary (Last 24 hours) at 06/10/2018 1011 Last data filed at 06/10/2018 0844 Gross per 24 hour  Intake 240 ml  Output 300 ml  Net -60 ml    PHYSICAL EXAMINATION:   Constitutional: He is oriented to person, place, and time. No distress.  HENT:  Head: Normocephalic.  Old scar on head present. Mouth/Throat: Oropharynx is clear and moist.  Eyes: Pupils are equal, round, and reactive to light. Conjunctivae and EOM are normal. No scleral icterus.  Neck: Normal range of  motion. Neck supple. No JVD present. No tracheal deviation present.  Cardiovascular: Normal rate, regular rhythm and normal heart sounds. Exam reveals no gallop.  No murmur heard. Pulmonary/Chest: Effort normal and breath sounds normal. No respiratory distress. He has no wheezes. He has no rales.  Abdominal: Soft. Bowel sounds are normal. He exhibits no distension. There is no tenderness. There is no rebound.  Musculoskeletal: He exhibits no edema or tenderness.  Neurological: He is alert and oriented to person, place, and time. No cranial nerve deficit. Moving all 4 limbs. Skin: No rash noted. No erythema.   DATA REVIEW:   CBC Recent Labs  Lab 06/08/18 0400 06/09/18 0447  WBC 8.8  --   HGB 10.1* 9.5*  HCT 30.8*  --   PLT 217  --     Chemistries  Recent Labs  Lab 06/08/18 0400  NA 138  K 3.9  CL 106  CO2 24  GLUCOSE 142*  BUN 16  CREATININE 0.76  CALCIUM 8.3*    Cardiac Enzymes No results for input(s): TROPONINI in the last 168 hours.  Microbiology Results  Results for orders placed or performed during the hospital encounter of 02/14/17  Blood Culture (routine x 2)     Status: None   Collection Time: 02/14/17 11:53 AM  Result Value Ref Range Status   Specimen Description BLOOD BLOOD RIGHT WRIST  Final   Special Requests   Final    BOTTLES DRAWN AEROBIC AND ANAEROBIC Blood Culture adequate volume   Culture NO GROWTH 5 DAYS  Final   Report Status 02/19/2017 FINAL  Final  Blood Culture (routine x 2)     Status: None   Collection Time: 02/14/17 11:53 AM  Result Value Ref Range Status   Specimen Description BLOOD BLOOD LEFT HAND  Final   Special Requests   Final    BOTTLES DRAWN AEROBIC AND ANAEROBIC Blood Culture adequate volume   Culture NO GROWTH 5 DAYS  Final   Report Status 02/19/2017 FINAL  Final    RADIOLOGY:  No results found.  EKG:   Orders placed or performed during the hospital encounter of 06/07/18  . EKG 12-Lead  . EKG 12-Lead  . ED EKG  .  ED EKG      Management plans discussed with the patient, family and they are in agreement.  CODE STATUS:     Code Status Orders  (  From admission, onward)         Start     Ordered   06/07/18 1134  Full code  Continuous     06/07/18 1135        Code Status History    Date Active Date Inactive Code Status Order ID Comments User Context   02/14/2017 1632 02/16/2017 1509 Full Code [id number]  Enedina Finner, MD Inpatient   02/09/2017 1809 02/13/2017 1848 Full Code [id number]  Altamese Dilling, MD ED   01/19/2017 1802 01/24/2017 1934 Full Code [id number]  Ramonita Lab, MD Inpatient    Advance Directive Documentation     Most Recent Value  Type of Advance Directive  Healthcare Power of Attorney  Pre-existing out of facility DNR order (yellow form or pink MOST form)  -  "MOST" Form in Place?  -      TOTAL TIME TAKING CARE OF THIS PATIENT: 35 minutes.    Altamese Dilling M.D on 06/10/2018 at 10:11 AM  Between 7am to 6pm - Pager - [phone]  After 6pm go to [email or web address] - password [email or web address]  [facility]  Office  [phone]  CC: Primary care physician; [facility]   Note: This dictation was prepared with Dragon dictation along with smaller phrase technology. Any transcriptional errors that result from this process are unintentional.

## 2018-12-26 IMAGING — DX DG CHEST 1V PORT
1 series · 1 of 1 positions shown · non-contrast
Comparison: None in PACs

CLINICAL DATA: Diaphoresis and tachycardia. Recently diagnosed with
hypocalcemia. History of traumatic brain injury. Current smoker.

EXAM:
PORTABLE CHEST 1 VIEW

[chest ap]
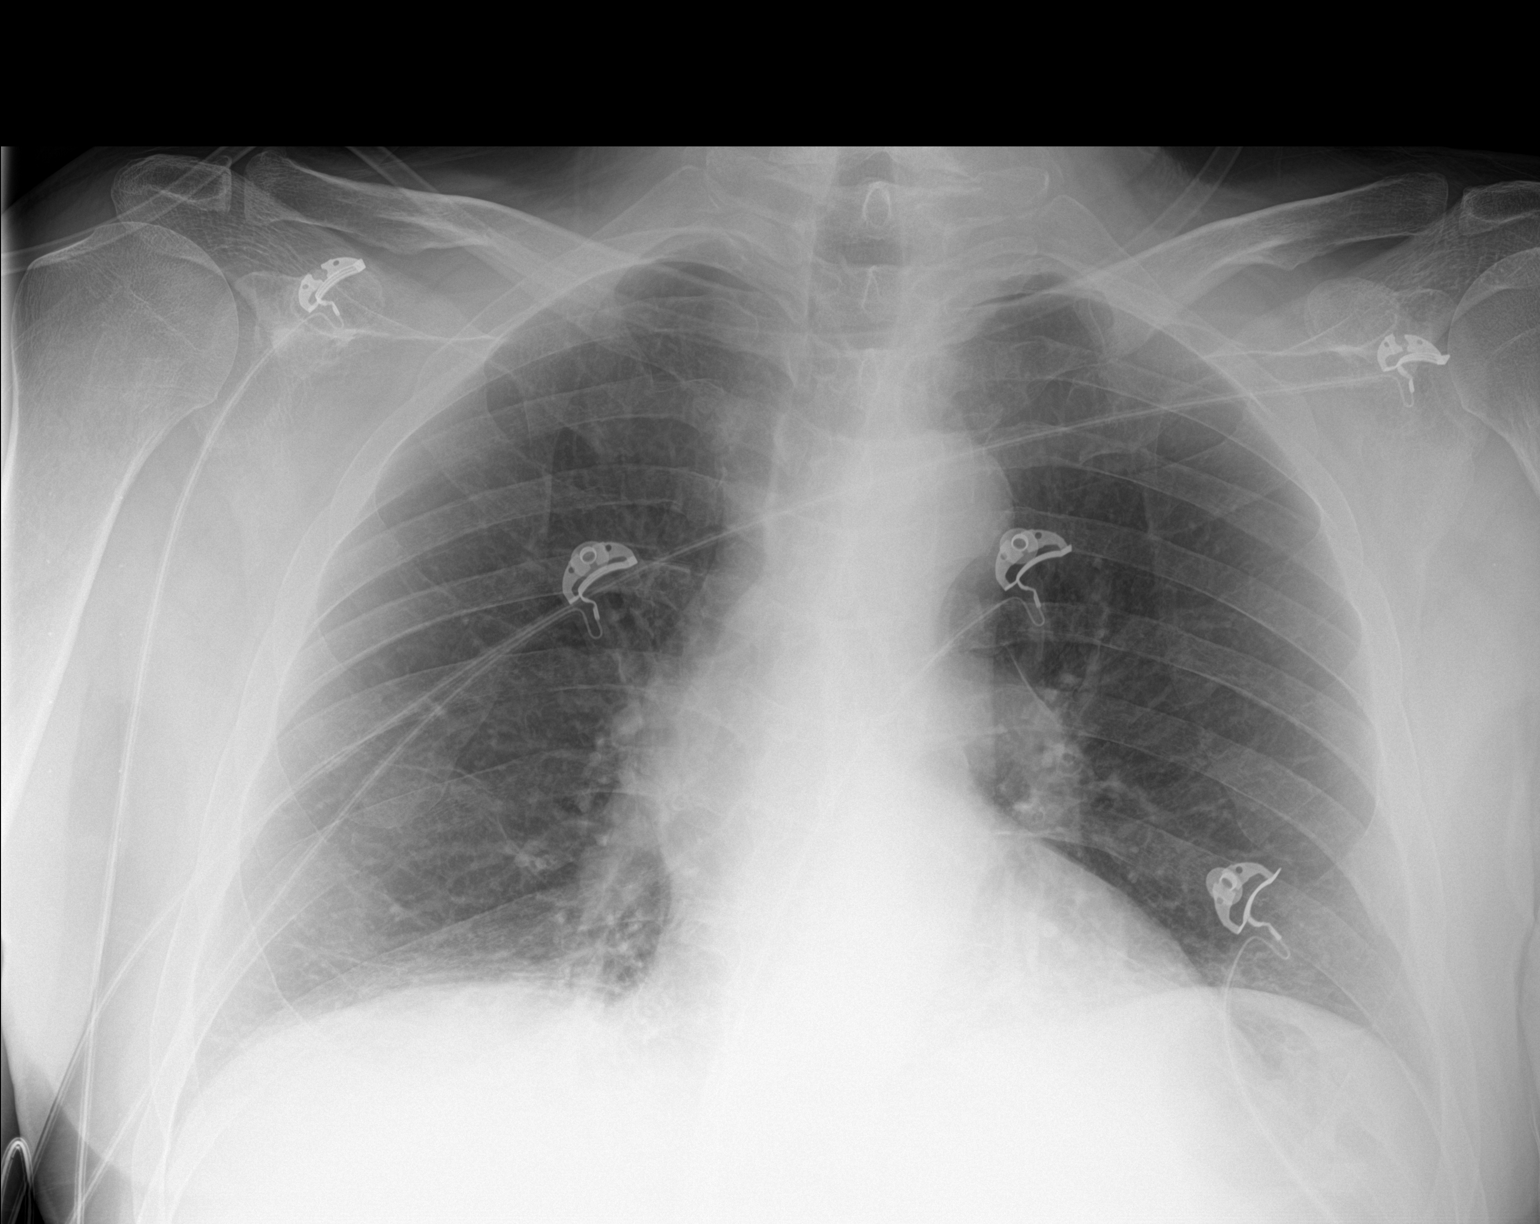

[1 of 1 positions shown; findings below may reference images not displayed]

FINDINGS: The lungs are adequately inflated. There is no focal infiltrate.
There is no pleural effusion. The heart and pulmonary vascularity
are normal. The mediastinum is normal in width. The bony thorax
exhibits no acute abnormality.
IMPRESSION: There is no acute cardiopulmonary abnormality.

## 2018-12-26 IMAGING — CT CT RENAL STONE PROTOCOL
2 of 4 series · 13 of 46 positions shown, 15 images · non-contrast
Comparison: Abdomen and pelvis CT 11/23/2012

CLINICAL DATA: The diaphoretic and tachycardic. Abdominal pain and
diarrhea.

EXAM:
CT CHEST, ABDOMEN AND PELVIS WITHOUT CONTRAST
TECHNIQUE: Multidetector CT imaging of the chest, abdomen and pelvis was
performed following the standard protocol without IV contrast.

[Series 2: cap wo st · axial · 0.95mm/px · z∈[-764,-139]mm · 10 of 147 slices shown, 12 images]
[im 11/147  soft-tissue]
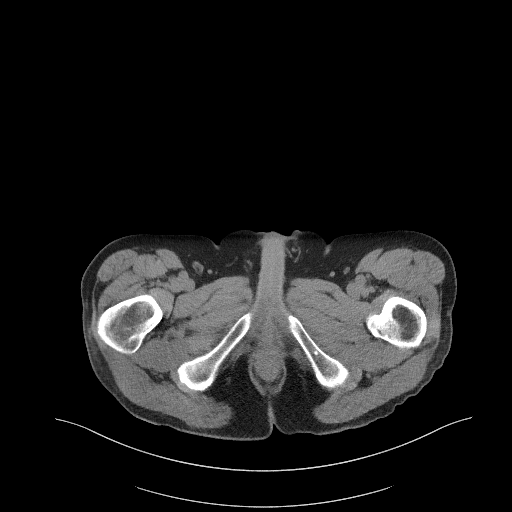
[im 11/147  bone]
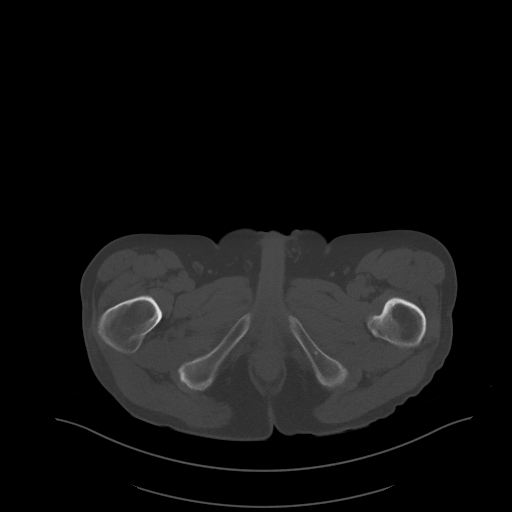
[im 21/147  soft-tissue]
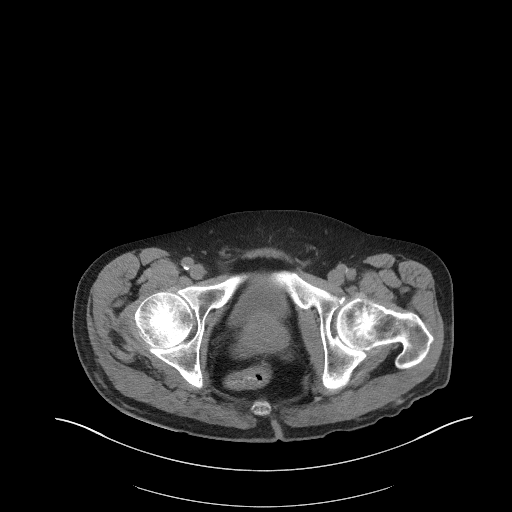
[im 42/147  soft-tissue]
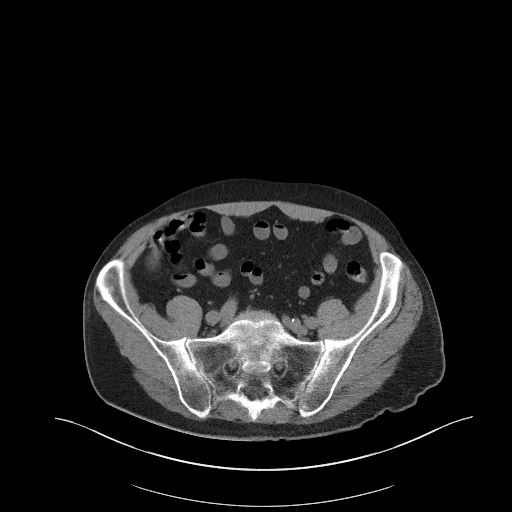
[im 53/147  soft-tissue]
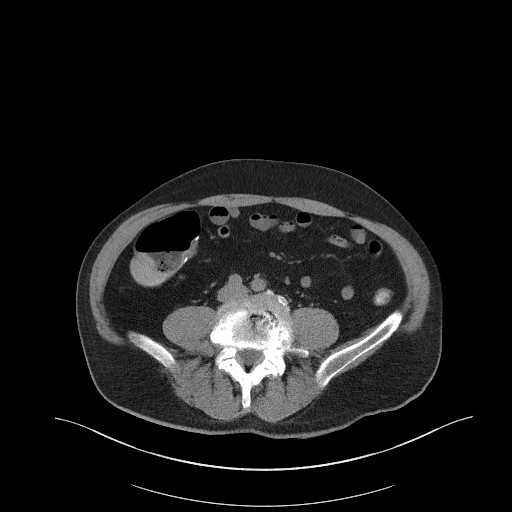
[im 63/147  soft-tissue]
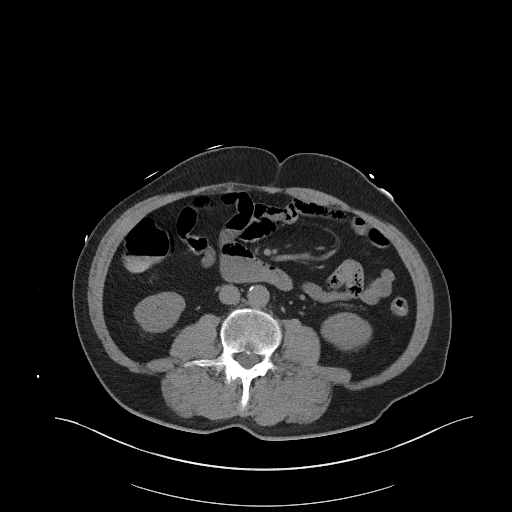
[im 84/147  soft-tissue]
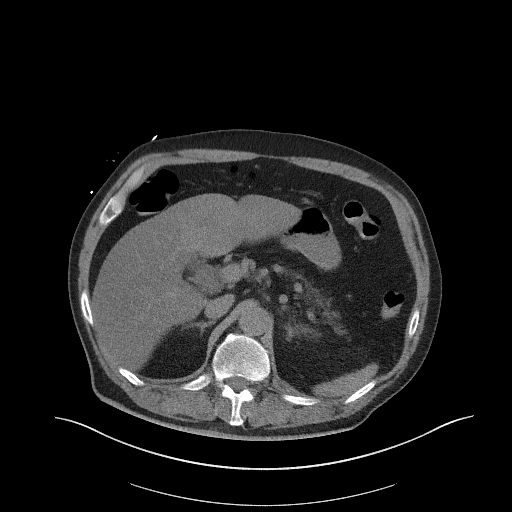
[im 94/147  soft-tissue]
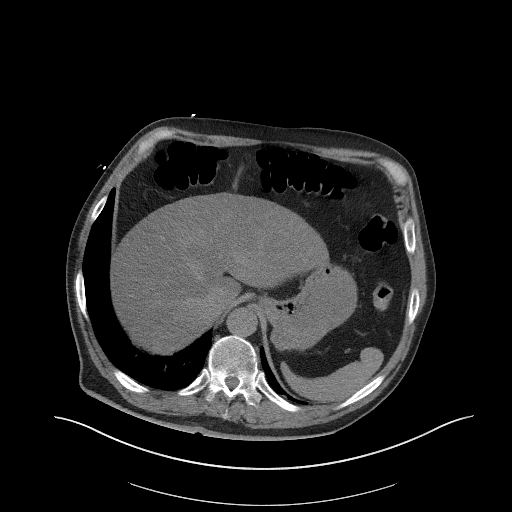
[im 105/147  soft-tissue]
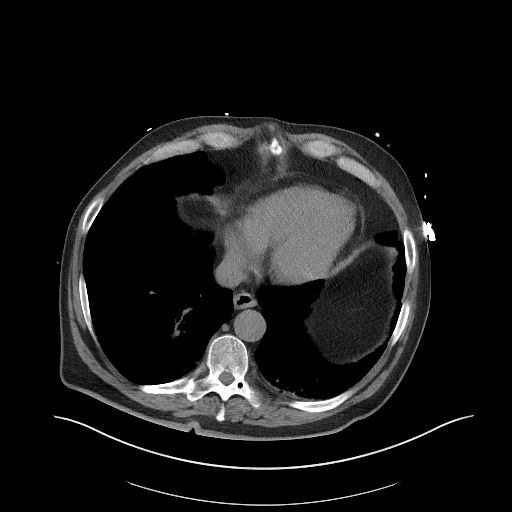
[im 126/147  soft-tissue]
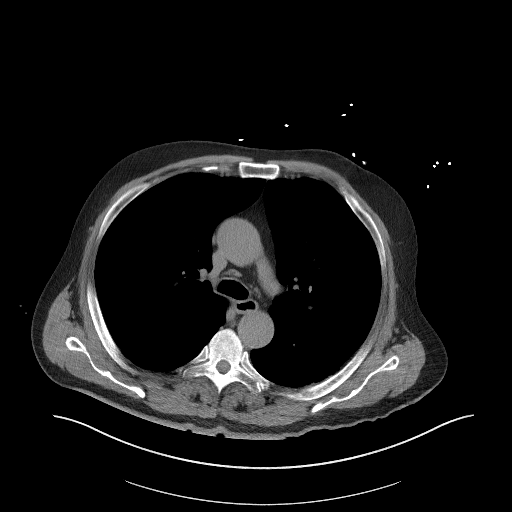
[im 126/147  bone]
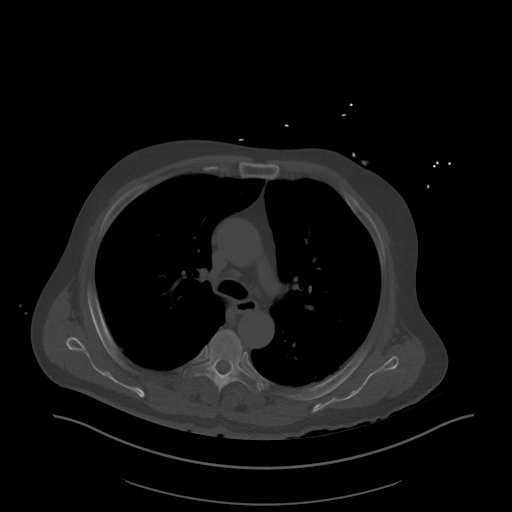
[im 136/147  soft-tissue]
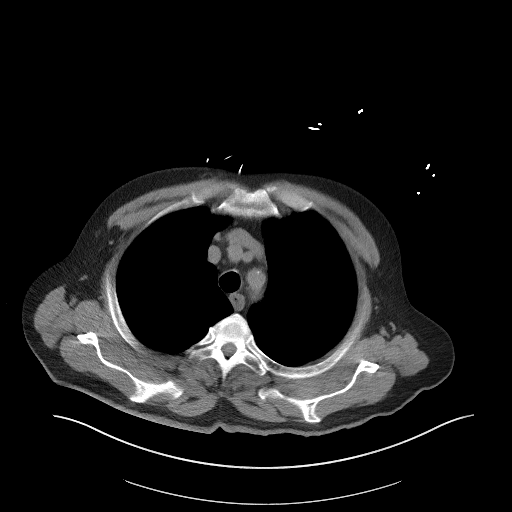

[Series 5: coronal · coronal · 0.76mm/px · 3 of 150 slices shown]
[im 50/150  soft-tissue]
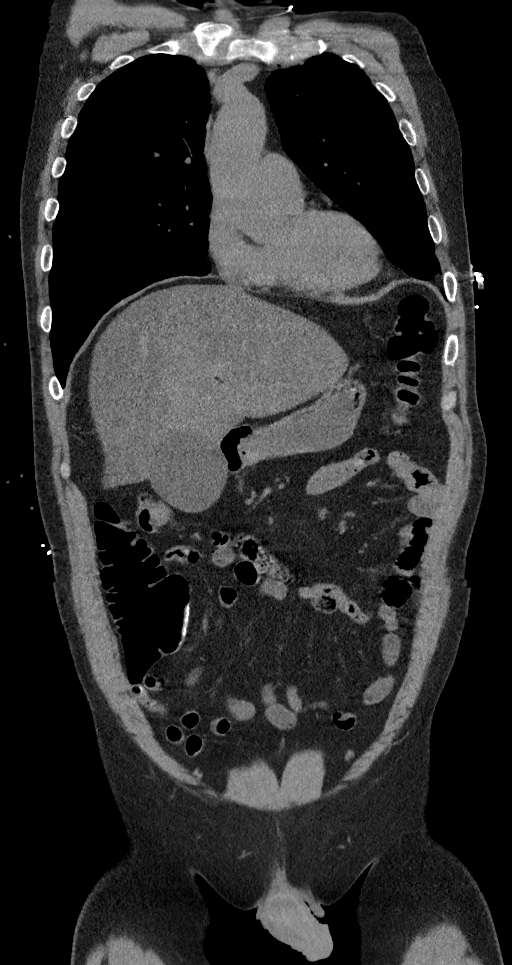
[im 67/150  soft-tissue]
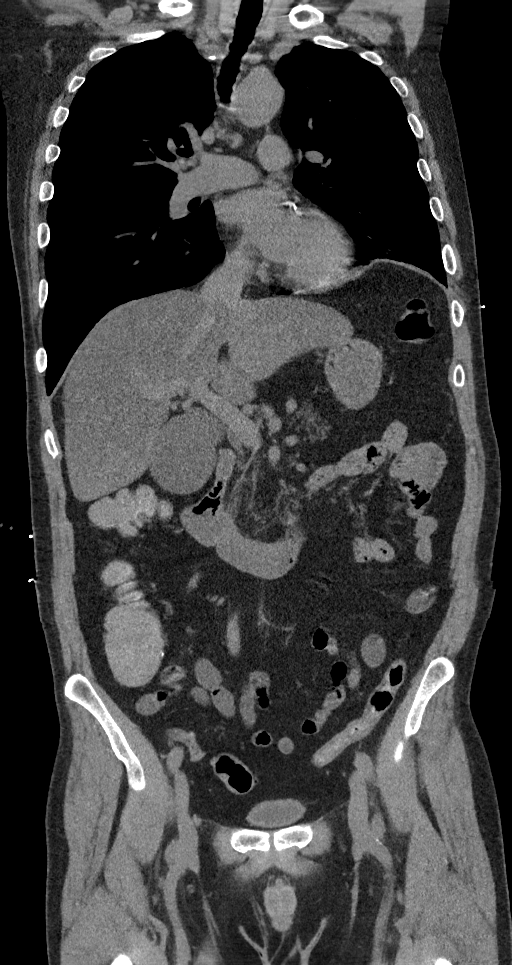
[im 83/150  soft-tissue]
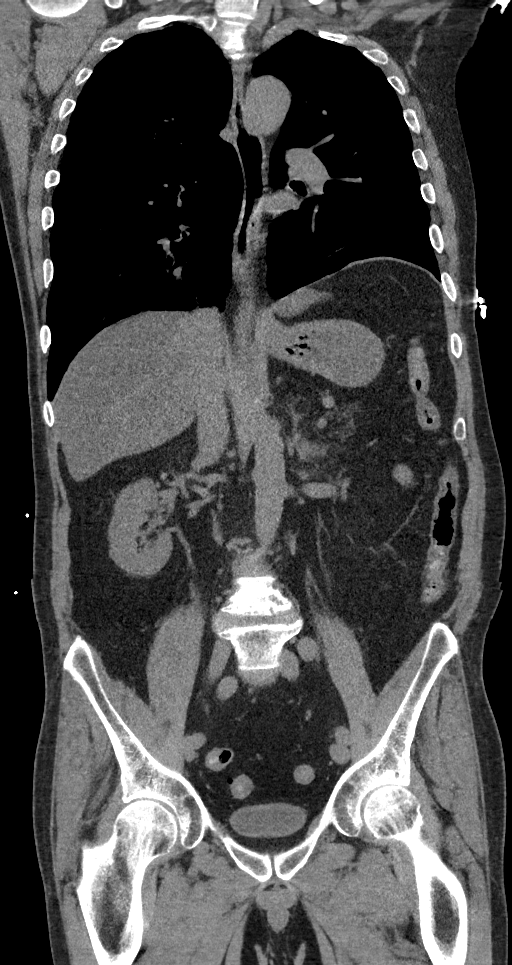

[13 of 46 positions shown; findings below may reference images not displayed]

FINDINGS: CT CHEST FINDINGS

Cardiovascular: The heart size is normal. No pericardial effusion.
Coronary artery calcification is noted. Atherosclerotic
calcification is noted in the wall of the thoracic aorta.

Mediastinum/Nodes: No mediastinal lymphadenopathy. No evidence for
gross hilar lymphadenopathy although assessment is limited by the
lack of intravenous contrast on today's study. The esophagus has
normal imaging features. There is no axillary lymphadenopathy.

Lungs/Pleura: Assessment lung parenchyma is degraded by breathing
motion. There is bibasilar atelectasis. Bronchial wall thickening is
most evident in the lower lobes and areas of small airway impaction
are seen in the lower lobes bilaterally. Probable scarring in the
right middle lobe and lingula slightly progressive since prior
abdomen and pelvis CT. No pulmonary edema or pleural effusion.

Musculoskeletal: Old left seventh rib fracture noted.

CT ABDOMEN PELVIS FINDINGS

Hepatobiliary: The liver shows diffusely decreased attenuation
suggesting steatosis. Gallbladder is markedly distended. No
intrahepatic or extrahepatic biliary dilation.

Pancreas: Pancreas diffusely fatty replaced without focal mass
lesion or dilatation of the main duct.

Spleen: No splenomegaly. No focal mass lesion.

Adrenals/Urinary Tract: No adrenal nodule or mass. Kidneys are
unremarkable bilaterally. No evidence for hydroureter. The urinary
bladder appears normal for the degree of distention.

Stomach/Bowel: Stomach is nondistended. No gastric wall thickening.
No evidence of outlet obstruction. Duodenum is normally positioned
as is the ligament of Treitz. No small bowel wall thickening. No
small bowel dilatation. Patient is status post right hemicolectomy.
Diverticular changes are noted in the left colon without evidence of
diverticulitis.

Vascular/Lymphatic: There is abdominal aortic atherosclerosis
without aneurysm. There is no gastrohepatic or hepatoduodenal
ligament lymphadenopathy. No intraperitoneal or retroperitoneal
lymphadenopathy. No pelvic sidewall lymphadenopathy.

Reproductive: The prostate gland and seminal vesicles have normal
imaging features.

Other: No intraperitoneal free fluid.

Musculoskeletal: Bone windows reveal no worrisome lytic or sclerotic
osseous lesions. Stable appearance L1 superior endplate compression
deformity.
IMPRESSION: 1. Bilateral posterior lower lobe collapse/consolidation with
bronchial wall thickening and small airway impaction. These changes
may be related to atypical infection or bronchopneumonia, but in the
appropriate clinical setting, aspiration could have the same imaging
findings.
2. Hepatic steatosis.
3. Marked gallbladder distention without biliary dilatation.
4. Status post right hemicolectomy without evidence for bowel
obstruction or bowel wall thickening. No intraperitoneal free fluid.
5. Stable appearance L1 superior endplate compression deformity.

## 2019-02-22 IMAGING — US US RENAL
1 series · 14 of 25 positions shown · non-contrast
Comparison: CT 01/19/2017

CLINICAL DATA: Acute kidney injury

EXAM:
RENAL / URINARY TRACT ULTRASOUND COMPLETE

[Series 1: us renal · 0.23mm/px · 14 of 37 slices shown]
[im 1/37]
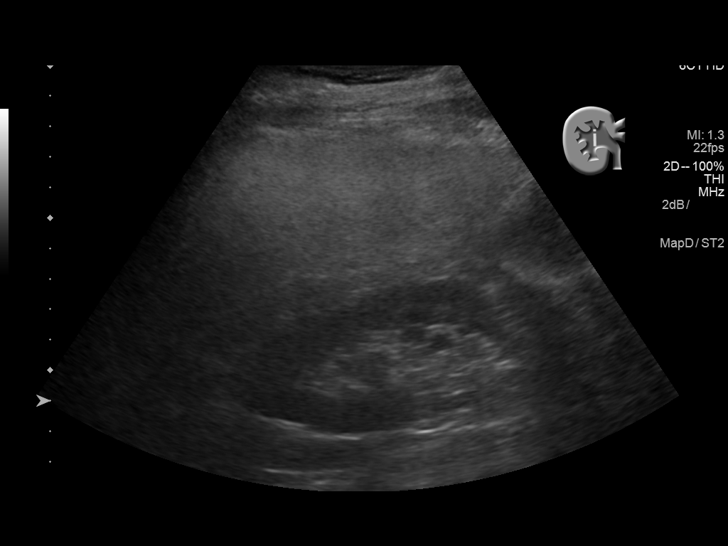
[im 4/37]
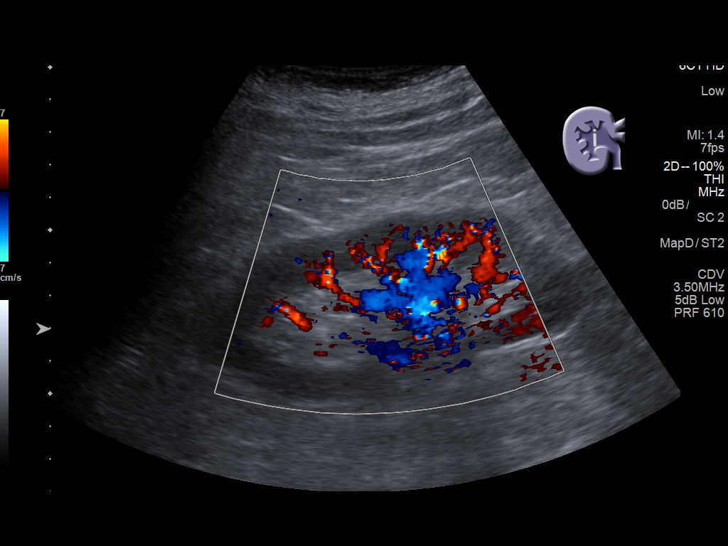
[im 7/37]
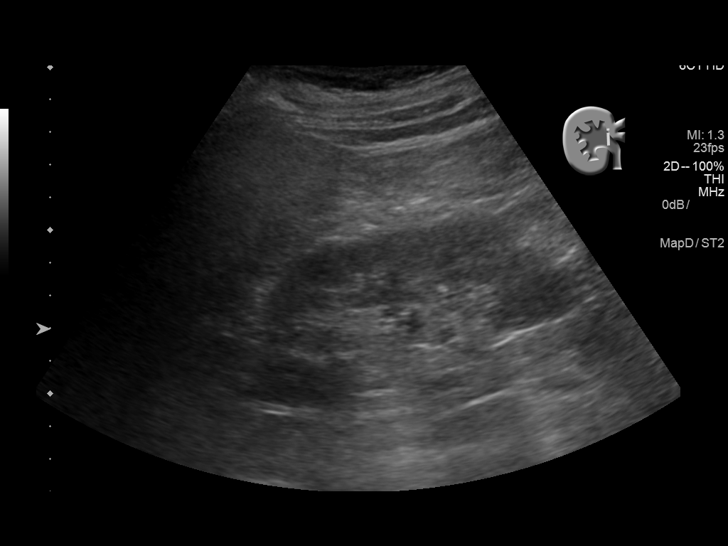
[im 10/37]
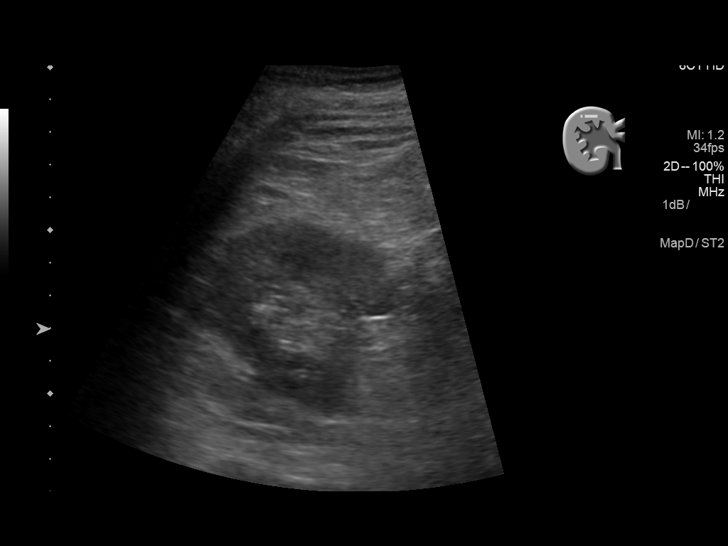
[im 13/37]
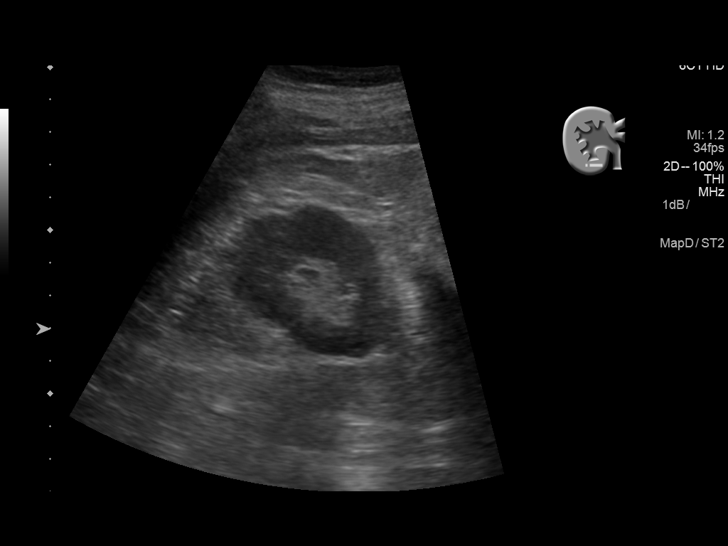
[im 14/37]
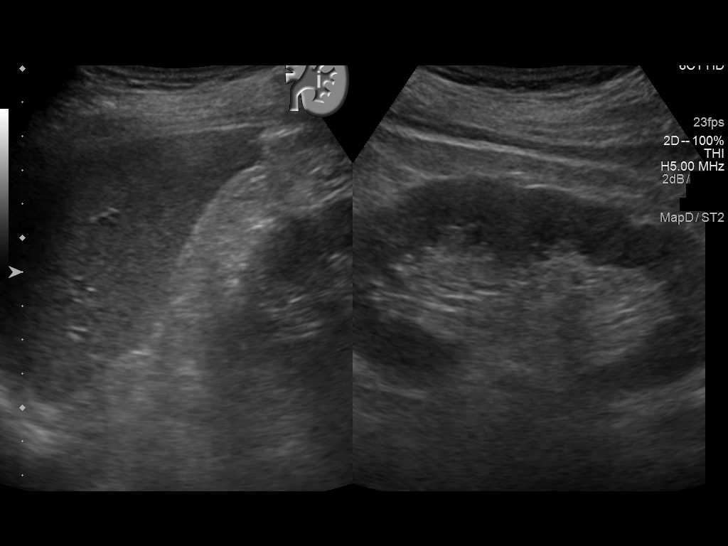
[im 17/37]
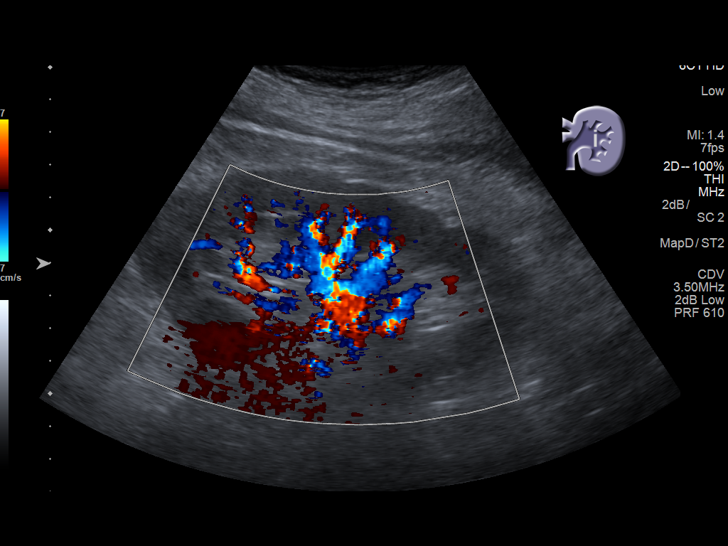
[im 20/37]
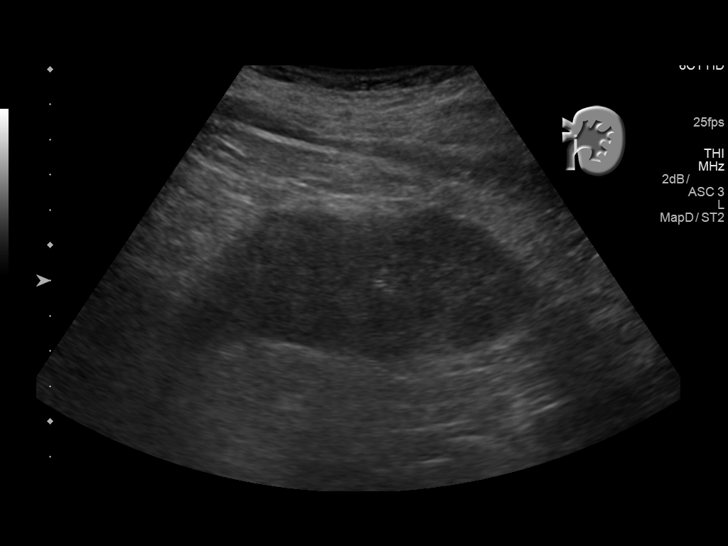
[im 23/37]
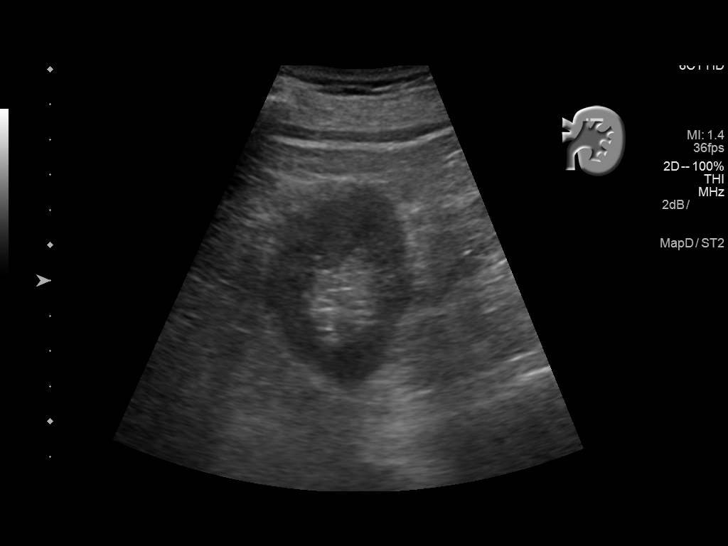
[im 25/37]
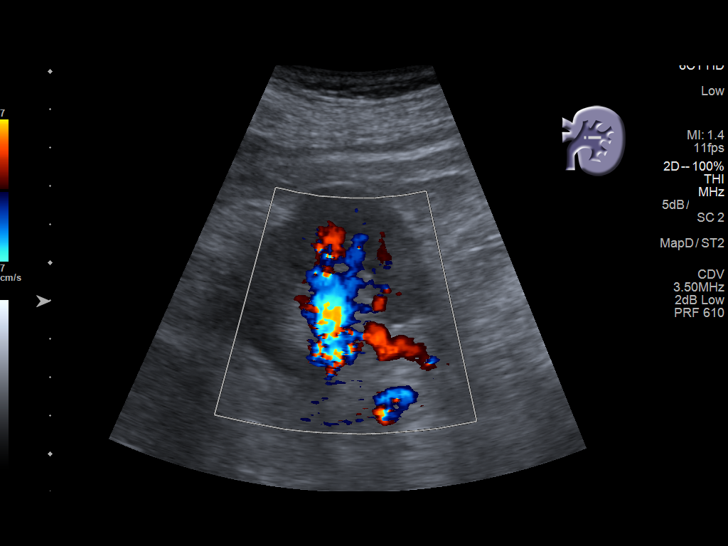
[im 28/37]
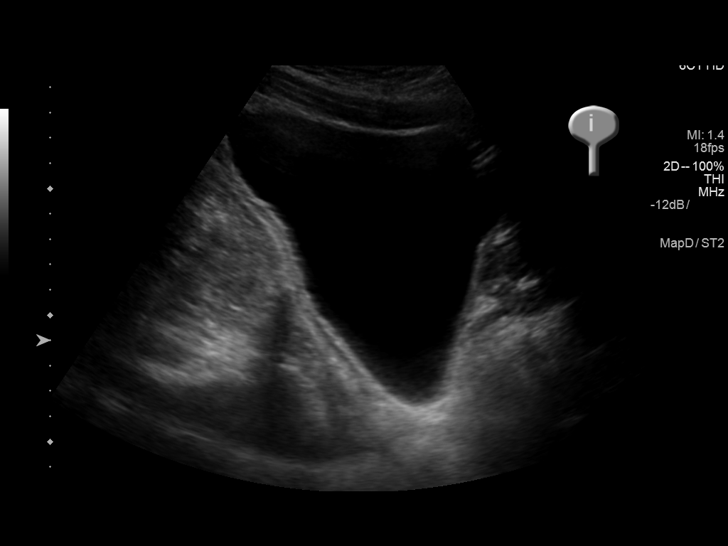
[im 31/37]
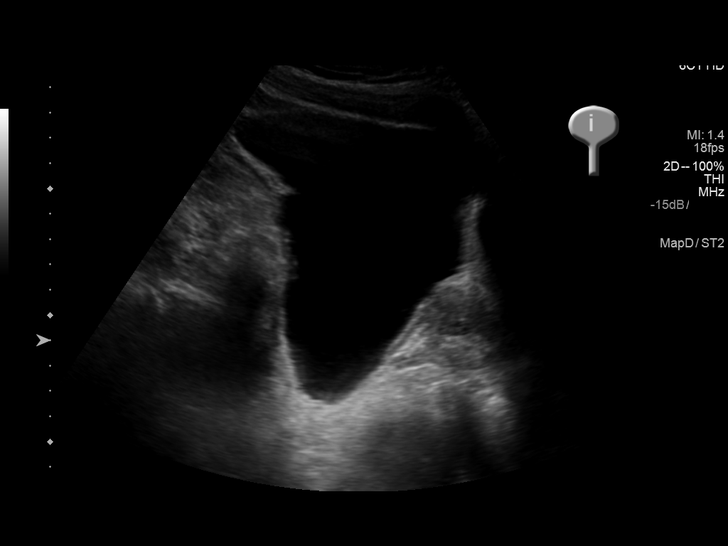
[im 34/37]
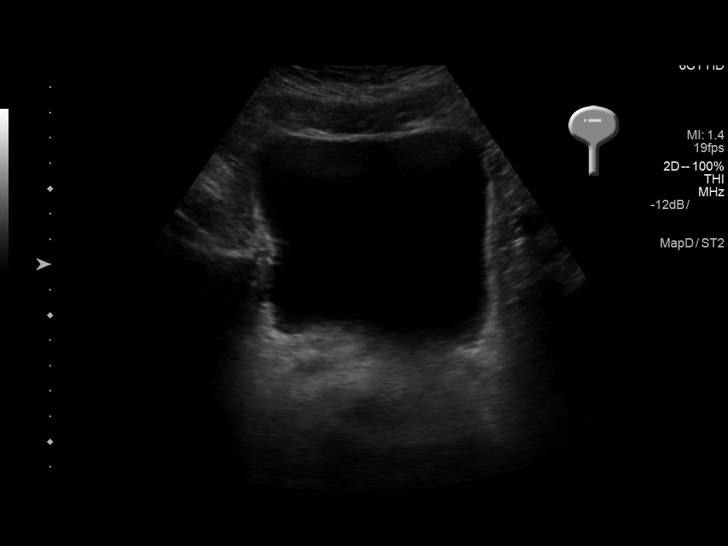
[im 37/37]
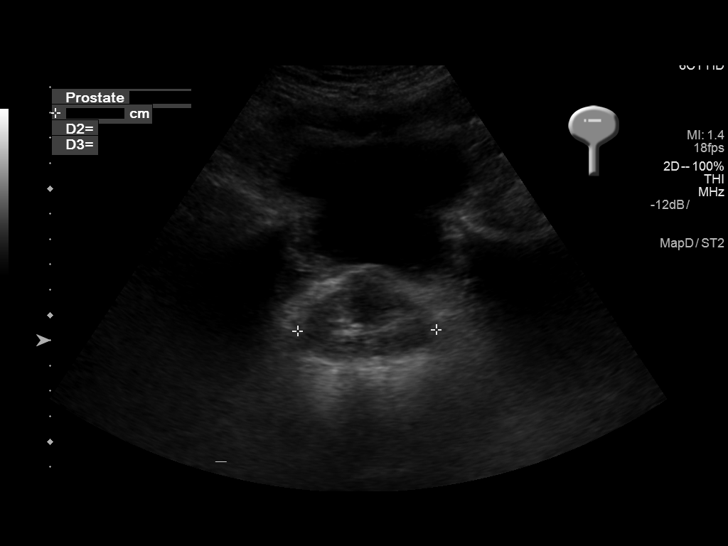

[14 of 25 positions shown; findings below may reference images not displayed]

FINDINGS: Right Kidney:

Length: 11.5 cm. Echogenicity within normal limits. No mass or
hydronephrosis visualized.

Left Kidney:

Length: 0.4 cm. Echogenicity within normal limits. No mass or
hydronephrosis visualized.

Bladder:

Appears normal for degree of bladder distention.
IMPRESSION: Normal renal ultrasound

## 2019-06-05 ENCOUNTER — Emergency Department: Payer: Medicare Other

## 2019-06-05 ENCOUNTER — Other Ambulatory Visit: Payer: Self-pay

## 2019-06-05 ENCOUNTER — Observation Stay
Admission: EM | Admit: 2019-06-05 | Discharge: 2019-06-06 | Disposition: A | Discharge status: Home / Self Care | Payer: Medicare Other | Attending: Internal Medicine | Admitting: Internal Medicine

## 2019-06-05 DIAGNOSIS — Z79899 Other long term (current) drug therapy: Secondary | ICD-10-CM | POA: Insufficient documentation

## 2019-06-05 DIAGNOSIS — F17210 Nicotine dependence, cigarettes, uncomplicated: Secondary | ICD-10-CM | POA: Diagnosis not present

## 2019-06-05 DIAGNOSIS — F1010 Alcohol abuse, uncomplicated: Secondary | ICD-10-CM | POA: Insufficient documentation

## 2019-06-05 DIAGNOSIS — Z881 Allergy status to other antibiotic agents status: Secondary | ICD-10-CM | POA: Insufficient documentation

## 2019-06-05 DIAGNOSIS — E860 Dehydration: Secondary | ICD-10-CM | POA: Insufficient documentation

## 2019-06-05 DIAGNOSIS — Z87820 Personal history of traumatic brain injury: Secondary | ICD-10-CM | POA: Diagnosis not present

## 2019-06-05 DIAGNOSIS — Z880 Allergy status to penicillin: Secondary | ICD-10-CM | POA: Insufficient documentation

## 2019-06-05 DIAGNOSIS — F39 Unspecified mood [affective] disorder: Secondary | ICD-10-CM | POA: Insufficient documentation

## 2019-06-05 DIAGNOSIS — Z20828 Contact with and (suspected) exposure to other viral communicable diseases: Secondary | ICD-10-CM | POA: Insufficient documentation

## 2019-06-05 DIAGNOSIS — Z7982 Long term (current) use of aspirin: Secondary | ICD-10-CM | POA: Diagnosis not present

## 2019-06-05 DIAGNOSIS — R55 Syncope and collapse: Principal | ICD-10-CM | POA: Insufficient documentation

## 2019-06-05 DIAGNOSIS — I10 Essential (primary) hypertension: Secondary | ICD-10-CM | POA: Diagnosis not present

## 2019-06-05 DIAGNOSIS — J439 Emphysema, unspecified: Secondary | ICD-10-CM | POA: Insufficient documentation

## 2019-06-05 DIAGNOSIS — R001 Bradycardia, unspecified: Secondary | ICD-10-CM | POA: Diagnosis not present

## 2019-06-05 DIAGNOSIS — Z888 Allergy status to other drugs, medicaments and biological substances status: Secondary | ICD-10-CM | POA: Diagnosis not present

## 2019-06-05 DIAGNOSIS — E876 Hypokalemia: Secondary | ICD-10-CM | POA: Diagnosis present

## 2019-06-05 LAB — URINE DRUG SCREEN, QUALITATIVE (ARMC ONLY)
Amphetamines, Ur Screen: NOT DETECTED
Barbiturates, Ur Screen: NOT DETECTED
Benzodiazepine, Ur Scrn: NOT DETECTED
Cannabinoid 50 Ng, Ur ~~LOC~~: NOT DETECTED
Cocaine Metabolite,Ur ~~LOC~~: NOT DETECTED
MDMA (Ecstasy)Ur Screen: NOT DETECTED
Methadone Scn, Ur: NOT DETECTED
Opiate, Ur Screen: NOT DETECTED
Phencyclidine (PCP) Ur S: NOT DETECTED
Tricyclic, Ur Screen: NOT DETECTED

## 2019-06-05 LAB — URINALYSIS, COMPLETE (UACMP) WITH MICROSCOPIC
Bacteria, UA: NONE SEEN
Bilirubin Urine: NEGATIVE
Glucose, UA: NEGATIVE mg/dL
Hgb urine dipstick: NEGATIVE
Ketones, ur: NEGATIVE mg/dL
Leukocytes,Ua: NEGATIVE
Nitrite: NEGATIVE
Protein, ur: NEGATIVE mg/dL
Specific Gravity, Urine: 1.016 (ref 1.005–1.030)
pH: 6 (ref 5.0–8.0)

## 2019-06-05 LAB — CBC
HCT: 38.7 % — ABNORMAL LOW (ref 39.0–52.0)
Hemoglobin: 13.4 g/dL (ref 13.0–17.0)
MCH: 31.7 pg (ref 26.0–34.0)
MCHC: 34.6 g/dL (ref 30.0–36.0)
MCV: 91.5 fL (ref 80.0–100.0)
Platelets: 244 10*3/uL (ref 150–400)
RBC: 4.23 MIL/uL (ref 4.22–5.81)
RDW: 12.4 % (ref 11.5–15.5)
WBC: 11.5 10*3/uL — ABNORMAL HIGH (ref 4.0–10.5)
nRBC: 0 % (ref 0.0–0.2)

## 2019-06-05 LAB — BASIC METABOLIC PANEL
Anion gap: 9 (ref 5–15)
BUN: 13 mg/dL (ref 8–23)
CO2: 24 mmol/L (ref 22–32)
Calcium: 8.8 mg/dL — ABNORMAL LOW (ref 8.9–10.3)
Chloride: 106 mmol/L (ref 98–111)
Creatinine, Ser: 1.01 mg/dL (ref 0.61–1.24)
GFR calc Af Amer: >60 mL/min (ref >60)
GFR calc non Af Amer: >60 mL/min (ref >60)
Glucose, Bld: 158 mg/dL — ABNORMAL HIGH (ref 70–99)
Potassium: 2.8 mmol/L — ABNORMAL LOW (ref 3.5–5.1)
Sodium: 139 mmol/L (ref 135–145)

## 2019-06-05 LAB — TSH: TSH: 2.952 u[IU]/mL (ref 0.350–4.500)

## 2019-06-05 MED ORDER — POTASSIUM CHLORIDE 10 MEQ/100ML IV SOLN
10.0000 meq | INTRAVENOUS | Status: AC
  Administered 2019-06-05: 10 meq via INTRAVENOUS
  Filled 2019-06-05 (×3): qty 100

## 2019-06-05 MED ORDER — LORATADINE 10 MG PO TABS: 10.0000 mg | ORAL_TABLET | Freq: Every day | ORAL | Status: DC | PRN

## 2019-06-05 MED ORDER — SODIUM CHLORIDE 0.9 % IV SOLN
INTRAVENOUS | Status: DC
  Administered 2019-06-06: 01:00:00 via INTRAVENOUS

## 2019-06-05 MED ORDER — LOPERAMIDE HCL 2 MG PO CAPS: 2.0000 mg | ORAL_CAPSULE | ORAL | Status: DC | PRN

## 2019-06-05 MED ORDER — POTASSIUM CHLORIDE 20 MEQ/15ML (10%) PO SOLN
20.0000 meq | Freq: Once | ORAL | Status: AC
  Administered 2019-06-06: 20 meq via ORAL
  Filled 2019-06-05 (×2): qty 15

## 2019-06-05 MED ORDER — SODIUM CHLORIDE 0.9 % IV BOLUS
500.0000 mL | Freq: Once | INTRAVENOUS | Status: AC
  Administered 2019-06-05: 500 mL via INTRAVENOUS

## 2019-06-05 MED ORDER — SODIUM CHLORIDE 0.9% FLUSH: 3.0000 mL | Freq: Two times a day (BID) | INTRAVENOUS | Status: DC

## 2019-06-05 MED ORDER — ACETAMINOPHEN 500 MG PO TABS
1000.0000 mg | ORAL_TABLET | Freq: Two times a day (BID) | ORAL | Status: DC
  Administered 2019-06-06 (×2): 1000 mg via ORAL
  Filled 2019-06-05 (×2): qty 2

## 2019-06-05 MED ORDER — VITAMIN B-12 1000 MCG PO TABS
1000.0000 ug | ORAL_TABLET | Freq: Every day | ORAL | Status: DC
  Administered 2019-06-06: 1000 ug via ORAL
  Filled 2019-06-05: qty 1

## 2019-06-05 MED ORDER — TRAZODONE HCL 50 MG PO TABS: 75.0000 mg | ORAL_TABLET | Freq: Every evening | ORAL | Status: DC

## 2019-06-05 MED ORDER — ENOXAPARIN SODIUM 40 MG/0.4ML ~~LOC~~ SOLN
40.0000 mg | SUBCUTANEOUS | Status: DC
  Administered 2019-06-06: 40 mg via SUBCUTANEOUS
  Filled 2019-06-05: qty 0.4

## 2019-06-05 MED ORDER — TESTOSTERONE CYPIONATE 200 MG/ML IM SOLN
100.0000 mg | INTRAMUSCULAR | Status: DC
  Filled 2019-06-05: qty 0.5

## 2019-06-05 NOTE — ED Notes (Addendum)
Pt otf for imaging, sister remains at bedside

## 2019-06-05 NOTE — ED Notes (Addendum)
Pt given more ginger ale per request

## 2019-06-05 NOTE — ED Notes (Signed)
ED TO INPATIENT HANDOFF REPORT  ED Nurse Name and Phone #: gracie  S Name/Age/Gender Alberteen Spindle 68 y.o. male Room/Bed: ED08A/ED08A  Code Status   Code Status: Full Code  Home/SNF/Other Nursing Home Patient oriented to: self, place, time and situation Is this baseline? Yes   Triage Complete: Triage complete  Chief Complaint AMS  Triage Note Pt arrives via [facility] from [place] [facility] for altered mental status. Per EMS pt was found in the smoking section outside slumped over in the chair. PT's hypotensive in route, given 247ml NS IV. Pt speaking with Dr. Jacqualine Code at this time. NAD. Speech clear. Hx TBI and multiple concussions.    Allergies Allergies  Allergen Reactions  . Bupropion Other (See Comments)    Delusions   . Effexor [Venlafaxine Hcl] Other (See Comments)    DELUSIONAL   . Amoxicillin Rash  . Clindamycin/Lincomycin Diarrhea    Level of Care/Admitting Diagnosis ED Disposition    ED Disposition Condition [facility]: [place] [[id number]]  Level of Care: Telemetry [5]  Covid Evaluation: Asymptomatic Screening Protocol (No Symptoms)  Diagnosis: Syncope [170017]  Admitting Physician: Lang Snow [[id number]]  Attending Physician: Rufina Falco ACHIENG [[id number]]  Estimated length of stay: past midnight tomorrow  Certification:: I certify this patient will need inpatient services for at least 2 midnights  PT Class (Do Not Modify): Inpatient [101]  PT Acc Code (Do Not Modify): Private [1]       B Medical/Surgery History Past Medical History:  Diagnosis Date  . Bacteremia 01/2017  . Hypertension   . TBI (traumatic brain injury) [facility])    Past Surgical History:  Procedure Laterality Date  . APPENDECTOMY    . FRACTURE SURGERY    . INTRAMEDULLARY (IM) NAIL INTERTROCHANTERIC Left 06/07/2018   Procedure: INTRAMEDULLARY (IM) NAIL INTERTROCHANTRIC;  Surgeon: Hessie Knows, MD;  Location: [facility] ORS;  Service:  Orthopedics;  Laterality: Left;     A IV Location/Drains/Wounds Patient Lines/Drains/Airways Status   Active Line/Drains/Airways    Name:   Placement date:   Placement time:   Site:   Days:   Peripheral IV 06/07/18 Right Hand   06/07/18    0815    Hand   363   Peripheral IV 06/07/18 Right Forearm   06/07/18    0816    Forearm   363   Peripheral IV 06/05/19 Right Hand   06/05/19    2057    Hand   less than 1   Incision (Closed) 06/07/18 Hip Left   06/07/18    1457     363          Intake/Output Last 24 hours No intake or output data in the 24 hours ending 06/05/19 2309  Labs/Imaging Results for orders placed or performed during the hospital encounter of 06/05/19 (from the past 48 hour(s))  CBC     Status: Abnormal   Collection Time: 06/05/19  6:27 PM  Result Value Ref Range   WBC 11.5 (H) 4.0 - 10.5 K/uL   RBC 4.23 4.22 - 5.81 MIL/uL   Hemoglobin 13.4 13.0 - 17.0 g/dL   HCT 38.7 (L) 39.0 - 52.0 %   MCV 91.5 80.0 - 100.0 fL   MCH 31.7 26.0 - 34.0 pg   MCHC 34.6 30.0 - 36.0 g/dL   RDW 12.4 11.5 - 15.5 %   Platelets 244 150 - 400 K/uL   nRBC 0.0 0.0 - 0.2 %    Comment:  Performed at [facility], [address] [place], [place] [address]  Basic metabolic panel     Status: Abnormal   Collection Time: 06/05/19  6:27 PM  Result Value Ref Range   Sodium 139 135 - 145 mmol/L   Potassium 2.8 (L) 3.5 - 5.1 mmol/L   Chloride 106 98 - 111 mmol/L   CO2 24 22 - 32 mmol/L   Glucose, Bld 158 (H) 70 - 99 mg/dL   BUN 13 8 - 23 mg/dL   Creatinine, Ser 1.91 0.61 - 1.24 mg/dL   Calcium 8.8 (L) 8.9 - 10.3 mg/dL   GFR calc non Af Amer >60 >60 mL/min   GFR calc Af Amer >60 >60 mL/min   Anion gap 9 5 - 15    Comment: Performed at [facility], [address] [place], [place] [address]  Urinalysis, Complete w Microscopic     Status: Abnormal   Collection Time: 06/05/19  6:27 PM  Result Value Ref Range   Color, Urine YELLOW (A) YELLOW   APPearance HAZY (A) CLEAR    Specific Gravity, Urine 1.016 1.005 - 1.030   pH 6.0 5.0 - 8.0   Glucose, UA NEGATIVE NEGATIVE mg/dL   Hgb urine dipstick NEGATIVE NEGATIVE   Bilirubin Urine NEGATIVE NEGATIVE   Ketones, ur NEGATIVE NEGATIVE mg/dL   Protein, ur NEGATIVE NEGATIVE mg/dL   Nitrite NEGATIVE NEGATIVE   Leukocytes,Ua NEGATIVE NEGATIVE   RBC / HPF 0-5 0 - 5 RBC/hpf   WBC, UA 0-5 0 - 5 WBC/hpf   Bacteria, UA NONE SEEN NONE SEEN   Squamous Epithelial / LPF 0-5 0 - 5   Mucus PRESENT    Hyaline Casts, UA PRESENT     Comment: Performed at [facility], [address] [place], [place] [address]   Ct Head Wo Contrast  Result Date: 06/05/2019 CLINICAL DATA:  Altered mental status EXAM: CT HEAD WITHOUT CONTRAST TECHNIQUE: Contiguous axial images were obtained from the base of the skull through the vertex without intravenous contrast. COMPARISON:  CT brain 06/07/2018 FINDINGS: Brain: No acute territorial infarction, hemorrhage or intracranial mass. Mild atrophy and small vessel ischemic changes of the white matter. Stable ventricle size Vascular: No hyperdense vessels.  Carotid vascular calcification Skull: Normal. Negative for fracture or focal lesion. Sinuses/Orbits: Age indeterminate nasal bone deformity. Patchy mucosal thickening in the ethmoid sinuses. Other: Multiple foci of gas within the sub occipital and neck soft tissues. Foci of gas external to the upper spinal canal. IMPRESSION: 1. No CT evidence for acute intracranial abnormality. Atrophy and small vessel ischemic changes of the white matter. 2. Multiple foci of soft tissue gas within the head and neck of uncertain source. Suggest chest radiography to evaluate for soft tissue emphysema and or pneumomediastinum. Electronically Signed   By: Jasmine Pang M.D.   On: 06/05/2019 18:59   Ct Chest Wo Contrast  Result Date: 06/05/2019 CLINICAL DATA:  Initial evaluation for acute altered mental status, question pneumomediastinum. EXAM: CT CHEST WITHOUT  CONTRAST TECHNIQUE: Multidetector CT imaging of the chest was performed following the standard protocol without IV contrast. COMPARISON:  Prior radiograph from 06/07/2018. FINDINGS: Cardiovascular: Intrathoracic aorta of normal caliber without aneurysm. Mild scattered aortic atherosclerosis. Partially visualized great vessels grossly within normal limits. Heart size normal. No pericardial effusion. Scattered 3 vessel coronary artery calcifications. Limited noncontrast evaluation of the pulmonary arterial tree grossly unremarkable. Mediastinum/Nodes: Thyroid within normal limits. No pathologically enlarged mediastinal, hilar, or axillary lymph nodes. Small amount of layering  fluid noted within the mid esophageal lumen. Esophagus otherwise unremarkable. No pneumomediastinum. Lungs/Pleura: Tracheobronchial tree intact and patent. Mild scattered linear atelectatic changes noted within the right middle lobe and lingula as well as the left lower lobe. No focal infiltrates or consolidative opacity. No pulmonary edema or pleural effusion. No pneumothorax. No worrisome pulmonary nodule or mass. Upper Abdomen: Visualized upper abdomen demonstrates no acute finding. Musculoskeletal: No acute osseous abnormality. No discrete lytic or blastic osseous lesions. Chronic L1 compression deformity noted. IMPRESSION: 1. No CT evidence for pneumomediastinum or other acute cardiopulmonary abnormality. 2. Mild scattered linear atelectatic changes within the lungs bilaterally. No other active cardiopulmonary disease identified. 3. Aortic atherosclerosis with three-vessel coronary artery calcifications. 4. Chronic L1 compression deformity. Electronically Signed   By: Rise Mu M.D.   On: 06/05/2019 20:50   Ct Cervical Spine Wo Contrast  Result Date: 06/05/2019 CLINICAL DATA:  Altered mental status EXAM: CT CERVICAL SPINE WITHOUT CONTRAST TECHNIQUE: Multidetector CT imaging of the cervical spine was performed without  intravenous contrast. Multiplanar CT image reconstructions were also generated. COMPARISON:  None. FINDINGS: Alignment: There is a minimal anterolisthesis of C4 on C5. Skull base and vertebrae: Visualized skull base is intact. No atlanto-occipital dissociation. The vertebral body heights are well maintained. No fracture or pathologic osseous lesion seen. Soft tissues and spinal canal: The visualized paraspinal soft tissues are unremarkable. No prevertebral soft tissue swelling is seen. The spinal canal is grossly unremarkable, no large epidural collection or significant canal narrowing. Disc levels: Disc osteophyte complex and uncovertebral osteophytes are most notable at C4-C5 and C5-C6. No significant canal stenosis however is noted. Upper chest: The lung apices are clear. Thoracic inlet is within normal limits. Other: None IMPRESSION: No acute fracture or malalignment of the spine. Electronically Signed   By: Jonna Clark M.D.   On: 06/05/2019 20:45    Pending Labs Unresulted Labs (From admission, onward)    Start     Ordered   06/06/19 0500  Basic metabolic panel  Tomorrow morning,   STAT     06/05/19 2153   06/06/19 0500  CBC  Tomorrow morning,   STAT     06/05/19 2153   06/05/19 2154  Urine Drug Screen, Qualitative ([facility] only)  Add-on,   AD     06/05/19 2153   06/05/19 2154  TSH  Add-on,   AD     06/05/19 2153   06/05/19 2154  Vitamin B12  Add-on,   AD     06/05/19 2153   06/05/19 2025  SARS CORONAVIRUS 2 (TAT 6-24 HRS) Nasopharyngeal Nasopharyngeal Swab  (Asymptomatic/Tier 2 Patients Labs)  Once,   STAT    Question Answer Comment  Is this test for diagnosis or screening Screening   Symptomatic for COVID-19 as defined by CDC No   Hospitalized for COVID-19 No   Admitted to ICU for COVID-19 No   Previously tested for COVID-19 No   Resident in a congregate (group) care setting No   Employed in healthcare setting No      06/05/19 2024          Vitals/Pain Today's Vitals   06/05/19  1945 06/05/19 2000 06/05/19 2030 06/05/19 2045  BP:  (!) 103/57 133/71   Pulse: (!) 49 (!) 53 (!) 56   Resp: 15 (!) 21 (!) 22 (!) 25  Temp:      TempSrc:      SpO2: 96% 95% 99%   Weight:      Height:  PainSc:        Isolation Precautions No active isolations  Medications Medications  potassium chloride 10 mEq in 100 mL IVPB (10 mEq Intravenous New Bag/Given 06/05/19 2158)  potassium chloride 20 MEQ/15ML (10%) solution 20 mEq (has no administration in time range)  acetaminophen (TYLENOL) tablet 1,000 mg (has no administration in time range)  traZODone (DESYREL) tablet 75 mg (has no administration in time range)  testosterone cypionate (DEPOTESTOSTERONE CYPIONATE) injection 100 mg (has no administration in time range)  loperamide (IMODIUM) capsule 2-4 mg (has no administration in time range)  vitamin B-12 (CYANOCOBALAMIN) tablet 1,000 mcg (has no administration in time range)  loratadine (CLARITIN) tablet 10 mg (has no administration in time range)  sodium chloride flush (NS) 0.9 % injection 3 mL (has no administration in time range)  enoxaparin (LOVENOX) injection 40 mg (has no administration in time range)  0.9 %  sodium chloride infusion (has no administration in time range)  sodium chloride 0.9 % bolus 500 mL (500 mLs Intravenous New Bag/Given 06/05/19 2157)    Mobility walks with person assist High fall risk   Focused Assessments Neuro Assessment Handoff:  Swallow screen pass? n/a         Neuro Assessment:   Neuro Checks:      Last Documented NIHSS Modified Score:   Has TPA been given? No If patient is a Neuro Trauma and patient is going to OR before floor call report to 4N Charge nurse: [phone] or [phone]     R Recommendations: See Admitting Provider Note  Report given to:   Additional Notes:

## 2019-06-05 NOTE — ED Notes (Signed)
Attempted to call report at this time 

## 2019-06-05 NOTE — ED Notes (Signed)
Pt changed into gown and soiled clothing placed into bag and given to sister. Pt cleaned up with the help of sister

## 2019-06-05 NOTE — ED Triage Notes (Addendum)
Pt arrives via [facility] from [facility] for altered mental status. Per EMS pt was found in the smoking section outside slumped over in the chair. PT's hypotensive in route, given 214ml NS IV. Pt speaking with Dr. Jacqualine Code at this time. NAD. Speech clear. Hx TBI and multiple concussions.

## 2019-06-05 NOTE — ED Provider Notes (Signed)
[facility] Emergency Department Provider Note   ____________________________________________   First MD Initiated Contact with Patient 06/05/19 1821     (approximate)  I have reviewed the triage vital signs and the nursing notes.   HISTORY  Chief Complaint Altered Mental Status  EM caveat: Patient does not have recollection of the event, previous traumatic brain injury some cognitive issues  HPI Jacob Villanueva is a 68 y.o. male   history of previous traumatic brain injury  Per the patient's family, he was with his caretaker after eating dinner was outside smoking cigarette when he seemed become very sleepy, and she reports that he then seemed to pass out.  He was grabbed by his caretaker in the seated position and was lowered to the ground did not fall.  There was no witnessed seizure activity but he seemed to be passed out briefly.  He did urinate on himself  His family reports that he does seem to be behaving normally now, he has significant mood disorder.  They have however noticed that he has been very fatigued for at least the last few weeks, recently left a different home setting, and is now at the [facility].  He does have a chronic cough, and they report his facility had reported no Covid cases known.  He does have a cough, but they think this is chronic but have not been able to see him for at least a couple of weeks now due to facility restrictions  The patient denies being in pain.  He reports that he was "kidnapped by the [employer]."  Past Medical History:  Diagnosis Date   Bacteremia 01/2017   Hypertension    TBI (traumatic brain injury) [facility])     Patient Active Problem List   Diagnosis Date Noted   Fracture of femoral neck, left, closed (HCC) 06/07/2018   Acute on chronic respiratory failure with hypoxia (HCC) 02/14/2017   Sepsis (HCC) 02/09/2017   Acute encephalopathy 01/19/2017    Past Surgical History:  Procedure Laterality Date    APPENDECTOMY     FRACTURE SURGERY     INTRAMEDULLARY (IM) NAIL INTERTROCHANTERIC Left 06/07/2018   Procedure: INTRAMEDULLARY (IM) NAIL INTERTROCHANTRIC;  Surgeon: Kennedy Bucker, MD;  Location: [facility] ORS;  Service: Orthopedics;  Laterality: Left;    Prior to Admission medications   Medication Sig Start Date End Date Taking? Authorizing Provider  aspirin EC 325 MG EC tablet Take 1 tablet (325 mg total) by mouth daily with breakfast. 06/09/18   Dedra Skeens, PA-C  bisacodyl (DULCOLAX) 5 MG EC tablet Take 1 tablet (5 mg total) by mouth daily as needed for moderate constipation. 06/10/18   Altamese Dilling, MD  calcium carbonate (TUMS EX) 750 MG chewable tablet Chew 1 tablet by mouth as needed.     [provider]  docusate sodium (COLACE) 100 MG capsule Take 1 capsule (100 mg total) by mouth 2 (two) times daily. 06/10/18   Altamese Dilling, MD  folic acid (FOLVITE) 1 MG tablet Take 1 tablet (1 mg total) by mouth daily. 06/11/18   Altamese Dilling, MD  HYDROcodone-acetaminophen (NORCO/VICODIN) 5-325 MG tablet Take 1-2 tablets by mouth every 4 (four) hours as needed for moderate pain (pain score 4-6). 06/09/18   Dedra Skeens, PA-C  menthol-cetylpyridinium (CEPACOL) 3 MG lozenge Take 1 lozenge (3 mg total) by mouth as needed for sore throat (sore throat). 06/10/18   Altamese Dilling, MD  methocarbamol (ROBAXIN) 500 MG tablet Take 1 tablet (500 mg total) by mouth every  6 (six) hours as needed for muscle spasms. 06/10/18   Altamese Dilling, MD  Multiple Vitamin (MULTIVITAMIN WITH MINERALS) TABS tablet Take 1 tablet by mouth daily. 06/11/18   Altamese Dilling, MD  nicotine (NICODERM CQ - DOSED IN MG/24 HOURS) 14 mg/24hr patch Place 1 patch (14 mg total) onto the skin daily. 06/11/18   Altamese Dilling, MD  sertraline (ZOLOFT) 50 MG tablet Take 1 tablet (50 mg total) by mouth every morning. 06/10/18   Altamese Dilling, MD  thiamine 100 MG tablet Take 1  tablet (100 mg total) by mouth daily. 06/11/18   Altamese Dilling, MD  traMADol (ULTRAM) 50 MG tablet Take 1 tablet (50 mg total) by mouth every 6 (six) hours. 06/09/18   Dedra Skeens, PA-C  traZODone (DESYREL) 50 MG tablet Take 0.5 tablets (25 mg total) by mouth at bedtime as needed for sleep. 06/10/18   Altamese Dilling, MD    Allergies Bupropion, Effexor [venlafaxine hcl], Amoxicillin, and Clindamycin/lincomycin  History reviewed. No pertinent family history.  Social History Social History   Tobacco Use   Smoking status: Current Every Day Smoker    Packs/day: 2.00   Smokeless tobacco: Never Used  Substance Use Topics   Alcohol use: Yes   Drug use: No    Review of Systems  EM caveat  Family reports that he has had loose stool some diarrhea for about 2 weeks, he has IBS with diarrhea and they think this is secondary to not being on Imodium which she is normally on and also report he has a history of low potassiums ____________________________________________   PHYSICAL EXAM:  VITAL SIGNS: ED Triage Vitals  Enc Vitals Group     BP 06/05/19 1831 99/68     Pulse Rate 06/05/19 1831 (!) 51     Resp 06/05/19 1831 20     Temp 06/05/19 1831 98.4 F (36.9 C)     Temp Source 06/05/19 1831 Oral     SpO2 06/05/19 1831 97 %     Weight 06/05/19 1824 180 lb (81.6 kg)     Height 06/05/19 1824 6' (1.829 m)     Head Circumference --      Peak Flow --      Pain Score 06/05/19 1824 0     Pain Loc --      Pain Edu? --      Excl. in GC? --     Constitutional: Alert and oriented.  He is somewhat easily agitated, but evidently this is close to his are at his baseline per family Eyes: Conjunctivae are normal. Head: Atraumatic. Nose: No congestion/rhinnorhea. Mouth/Throat: Mucous membranes are dry. Neck: No stridor.  Cardiovascular: Normal rate, regular rhythm. Grossly normal heart sounds.  Good peripheral circulation. Respiratory: Normal respiratory effort.  No  retractions. Lungs CTAB. Gastrointestinal: Soft and nontender. No distention. Musculoskeletal: No lower extremity tenderness nor edema. Neurologic:  Normal speech and language. No gross focal neurologic deficits are appreciated.  Skin:  Skin is warm, dry and intact. No rash noted. Psychiatric: Mood and affect are calm, but becomes elevated when he talks about what happened, he is agitated about the fact that he was kidnapped against his will taken here by gestapo.   ____________________________________________   LABS (all labs ordered are listed, but only abnormal results are displayed)  Labs Reviewed  CBC - Abnormal; Notable for the following components:      Result Value   WBC 11.5 (*)    HCT 38.7 (*)    All other  components within normal limits  BASIC METABOLIC PANEL - Abnormal; Notable for the following components:   Potassium 2.8 (*)    Glucose, Bld 158 (*)    Calcium 8.8 (*)    All other components within normal limits  URINALYSIS, COMPLETE (UACMP) WITH MICROSCOPIC - Abnormal; Notable for the following components:   Color, Urine YELLOW (*)    APPearance HAZY (*)    All other components within normal limits  SARS CORONAVIRUS 2 (TAT 6-24 HRS)   ____________________________________________ EKG  Reviewed and interpreted at 1830 Heart rate 55 Cures 100 QTc 445 Normal sinus rhythm, no evidence of acute ischemia denoted ____________________________________________  RADIOLOGY  Ct Head Wo Contrast  Result Date: 06/05/2019 CLINICAL DATA:  Altered mental status EXAM: CT HEAD WITHOUT CONTRAST TECHNIQUE: Contiguous axial images were obtained from the base of the skull through the vertex without intravenous contrast. COMPARISON:  CT brain 06/07/2018 FINDINGS: Brain: No acute territorial infarction, hemorrhage or intracranial mass. Mild atrophy and small vessel ischemic changes of the white matter. Stable ventricle size Vascular: No hyperdense vessels.  Carotid vascular  calcification Skull: Normal. Negative for fracture or focal lesion. Sinuses/Orbits: Age indeterminate nasal bone deformity. Patchy mucosal thickening in the ethmoid sinuses. Other: Multiple foci of gas within the sub occipital and neck soft tissues. Foci of gas external to the upper spinal canal. IMPRESSION: 1. No CT evidence for acute intracranial abnormality. Atrophy and small vessel ischemic changes of the white matter. 2. Multiple foci of soft tissue gas within the head and neck of uncertain source. Suggest chest radiography to evaluate for soft tissue emphysema and or pneumomediastinum. Electronically Signed   By: Donavan Foil M.D.   On: 06/05/2019 18:59   Ct Chest Wo Contrast  Result Date: 06/05/2019 CLINICAL DATA:  Initial evaluation for acute altered mental status, question pneumomediastinum. EXAM: CT CHEST WITHOUT CONTRAST TECHNIQUE: Multidetector CT imaging of the chest was performed following the standard protocol without IV contrast. COMPARISON:  Prior radiograph from 06/07/2018. FINDINGS: Cardiovascular: Intrathoracic aorta of normal caliber without aneurysm. Mild scattered aortic atherosclerosis. Partially visualized great vessels grossly within normal limits. Heart size normal. No pericardial effusion. Scattered 3 vessel coronary artery calcifications. Limited noncontrast evaluation of the pulmonary arterial tree grossly unremarkable. Mediastinum/Nodes: Thyroid within normal limits. No pathologically enlarged mediastinal, hilar, or axillary lymph nodes. Small amount of layering fluid noted within the mid esophageal lumen. Esophagus otherwise unremarkable. No pneumomediastinum. Lungs/Pleura: Tracheobronchial tree intact and patent. Mild scattered linear atelectatic changes noted within the right middle lobe and lingula as well as the left lower lobe. No focal infiltrates or consolidative opacity. No pulmonary edema or pleural effusion. No pneumothorax. No worrisome pulmonary nodule or mass. Upper  Abdomen: Visualized upper abdomen demonstrates no acute finding. Musculoskeletal: No acute osseous abnormality. No discrete lytic or blastic osseous lesions. Chronic L1 compression deformity noted. IMPRESSION: 1. No CT evidence for pneumomediastinum or other acute cardiopulmonary abnormality. 2. Mild scattered linear atelectatic changes within the lungs bilaterally. No other active cardiopulmonary disease identified. 3. Aortic atherosclerosis with three-vessel coronary artery calcifications. 4. Chronic L1 compression deformity. Electronically Signed   By: Jeannine Boga M.D.   On: 06/05/2019 20:50   Ct Cervical Spine Wo Contrast  Result Date: 06/05/2019 CLINICAL DATA:  Altered mental status EXAM: CT CERVICAL SPINE WITHOUT CONTRAST TECHNIQUE: Multidetector CT imaging of the cervical spine was performed without intravenous contrast. Multiplanar CT image reconstructions were also generated. COMPARISON:  None. FINDINGS: Alignment: There is a minimal anterolisthesis of C4 on C5. Skull base  and vertebrae: Visualized skull base is intact. No atlanto-occipital dissociation. The vertebral body heights are well maintained. No fracture or pathologic osseous lesion seen. Soft tissues and spinal canal: The visualized paraspinal soft tissues are unremarkable. No prevertebral soft tissue swelling is seen. The spinal canal is grossly unremarkable, no large epidural collection or significant canal narrowing. Disc levels: Disc osteophyte complex and uncovertebral osteophytes are most notable at C4-C5 and C5-C6. No significant canal stenosis however is noted. Upper chest: The lung apices are clear. Thoracic inlet is within normal limits. Other: None IMPRESSION: No acute fracture or malalignment of the spine. Electronically Signed   By: Jonna ClarkBindu  Avutu M.D.   On: 06/05/2019 20:45    CT imaging reviewed, soft tissue and hair in the head neck of uncertain  significance. ____________________________________________   PROCEDURES  Procedure(s) performed: None  Procedures  Critical Care performed: No  ____________________________________________   INITIAL IMPRESSION / ASSESSMENT AND PLAN / ED COURSE  Pertinent labs & imaging results that were available during my care of the patient were reviewed by me and considered in my medical decision making (see chart for details).   Patient presents for was described as syncopal type episode.  He denies syncope, but it does appear from report that he definitely had an episode of unresponsiveness and was lowered to the ground.  He may not recall it, but does appear that he has syncopal event.  His EKG and lab work is unrevealing except for notable hypokalemia which possibly could be due to loose stool, etiology is not clear.  Does not exhibit any clear symptoms of infection, there is no evidence of focal deficit, after receiving evaluation and treatment in the ER his mental status appears to be improved and at baseline.  Certainly seizure would be on his differential as well.  CT imaging of the cervical spine and chest do not show any evidence of pneumomediastinum.  Etiology on CT the head is somewhat unclear, but there is no evidence of significant traumatic injury.  ----------------------------------------- 9:17 PM on 06/05/2019 -----------------------------------------  Discussed with patient and his family at the bedside, comfortable with plan for admission, syncope work-up and treatment of hypokalemia.  Nicoletta Balex Mercado was evaluated in Emergency Department on 06/05/2019 for the symptoms described in the history of present illness. He was evaluated in the context of the global COVID-19 pandemic, which necessitated consideration that the patient might be at risk for infection with the SARS-CoV-2 virus that causes COVID-19. Institutional protocols and algorithms that pertain to the evaluation of patients at risk  for COVID-19 are in a state of rapid change based on information released by regulatory bodies including the CDC and federal and state organizations. These policies and algorithms were followed during the patient's care in the ED.  Return precautions and treatment recommendations and follow-up discussed with the patient who is agreeable with the plan.       ____________________________________________   FINAL CLINICAL IMPRESSION(S) / ED DIAGNOSES  Final diagnoses:  Hypokalemia  Dehydration  Syncope and collapse        Note:  This document was prepared using Dragon voice recognition software and may include unintentional dictation errors       Sharyn CreamerQuale, Richie Vadala, MD 06/05/19 2118

## 2019-06-05 NOTE — ED Notes (Signed)
Pt given warm blankets, seizure pads placed

## 2019-06-05 NOTE — ED Notes (Signed)
Admitting provider at bedside.

## 2019-06-05 NOTE — ED Notes (Signed)
Pt given gingerale and sister given water

## 2019-06-05 NOTE — ED Notes (Addendum)
Pt otf for imaging. Sister at bedside. Sister states POA Judson Roch listed in chart is more updated on his care

## 2019-06-05 NOTE — ED Notes (Signed)
Report given to Gracie, RN 

## 2019-06-05 NOTE — ED Notes (Signed)
Pt given more gingerale per pt request

## 2019-06-06 ENCOUNTER — Other Ambulatory Visit: Payer: Medicare Other

## 2019-06-06 ENCOUNTER — Inpatient Hospital Stay
Admit: 2019-06-06 | Discharge: 2019-06-06 | Disposition: A | Discharge status: Home / Self Care | Payer: Medicare Other | Attending: Nurse Practitioner | Admitting: Nurse Practitioner

## 2019-06-06 DIAGNOSIS — R55 Syncope and collapse: Secondary | ICD-10-CM

## 2019-06-06 LAB — ECHOCARDIOGRAM COMPLETE
Height: 72 in
Weight: 3113.6 oz

## 2019-06-06 LAB — BASIC METABOLIC PANEL
Anion gap: 9 (ref 5–15)
BUN: 11 mg/dL (ref 8–23)
CO2: 23 mmol/L (ref 22–32)
Calcium: 8.4 mg/dL — ABNORMAL LOW (ref 8.9–10.3)
Chloride: 107 mmol/L (ref 98–111)
Creatinine, Ser: 0.77 mg/dL (ref 0.61–1.24)
GFR calc Af Amer: >60 mL/min (ref >60)
GFR calc non Af Amer: >60 mL/min (ref >60)
Glucose, Bld: 113 mg/dL — ABNORMAL HIGH (ref 70–99)
Potassium: 3.4 mmol/L — ABNORMAL LOW (ref 3.5–5.1)
Sodium: 139 mmol/L (ref 135–145)

## 2019-06-06 LAB — CBC
HCT: 35.6 % — ABNORMAL LOW (ref 39.0–52.0)
Hemoglobin: 12.2 g/dL — ABNORMAL LOW (ref 13.0–17.0)
MCH: 31.7 pg (ref 26.0–34.0)
MCHC: 34.3 g/dL (ref 30.0–36.0)
MCV: 92.5 fL (ref 80.0–100.0)
Platelets: 207 10*3/uL (ref 150–400)
RBC: 3.85 MIL/uL — ABNORMAL LOW (ref 4.22–5.81)
RDW: 12.5 % (ref 11.5–15.5)
WBC: 8.9 10*3/uL (ref 4.0–10.5)
nRBC: 0 % (ref 0.0–0.2)

## 2019-06-06 LAB — MAGNESIUM: Magnesium: 1.9 mg/dL (ref 1.7–2.4)

## 2019-06-06 LAB — VITAMIN B12: Vitamin B-12: 1168 pg/mL — ABNORMAL HIGH (ref 180–914)

## 2019-06-06 LAB — SARS CORONAVIRUS 2 (TAT 6-24 HRS): SARS Coronavirus 2: NEGATIVE

## 2019-06-06 MED ORDER — LOPERAMIDE HCL 2 MG PO TABS: 2.0000 mg | ORAL_TABLET | ORAL | 0 refills | Status: AC | PRN

## 2019-06-06 MED ORDER — CARVEDILOL 12.5 MG PO TABS
12.5000 mg | ORAL_TABLET | Freq: Two times a day (BID) | ORAL | Status: DC
  Filled 2019-06-06: qty 1

## 2019-06-06 MED ORDER — POTASSIUM CHLORIDE 10 MEQ/100ML IV SOLN
10.0000 meq | Freq: Once | INTRAVENOUS | Status: AC
  Administered 2019-06-06: 10 meq via INTRAVENOUS

## 2019-06-06 MED ORDER — POTASSIUM CHLORIDE CRYS ER 20 MEQ PO TBCR
40.0000 meq | EXTENDED_RELEASE_TABLET | Freq: Once | ORAL | Status: AC
  Administered 2019-06-06: 40 meq via ORAL
  Filled 2019-06-06: qty 2

## 2019-06-06 NOTE — Care Management CC44 (Signed)
Condition Code 44 Documentation Completed  Patient Details  Name: Jacob Villanueva MRN: [id number] Date of Birth: 09/04/1950   Condition Code 44 given:  Yes Patient signature on Condition Code 44 notice:  Yes Documentation of 2 MD's agreement:  Yes Code 44 added to claim:  Yes    Ross Ludwig, LCSW 06/06/2019, 2:31 PM

## 2019-06-06 NOTE — Discharge Summary (Signed)
[employer] [employer] - [place] [facility]   PATIENT NAME: Jacob Villanueva    MR#:  [id number]  DATE OF BIRTH:  09/22/1950  DATE OF ADMISSION:  06/05/2019 ADMITTING PHYSICIAN: Jimmye NormanElizabeth Achieng Ouma, NP  DATE OF DISCHARGE: 06/06/2019  PRIMARY CARE PHYSICIAN: [facility]   ADMISSION DIAGNOSIS:  Syncope and collapse [R55] Dehydration [E86.0] Hypokalemia [E87.6]  DISCHARGE DIAGNOSIS:  Active Problems:   Syncope Dehydration Hypokalemia Emphysema Tobacco abuse Syncope appears vasovagal etiology SECONDARY DIAGNOSIS:   Past Medical History:  Diagnosis Date  . Bacteremia 01/2017  . Hypertension   . TBI (traumatic brain injury) [facility](HCC)      ADMITTING HISTORY 68 y.o. male with pertinent past medical history of TBI, hypertension, COPD, mood disorder, anemia, and EtOH abuse presenting to the ED with near syncopal event.  Patient state he was outside smoking and the next thing he recalled he was in the ambulance being transported to the ED.  Per patient's sister who is currently at the bedside, staff at the facility reported that patient was sitting when suddenly he was noted to be slumping over looking pale.  Per caregiver who witnessed the event, patient was unresponsive and diaphoretic.  Was no witnessed seizure activity but he seemed to be passed out briefly.  He did lose control of his bladder.  He has had similar episode in the past without findings to suggest cardiac or neurologic source of his near syncope.  On arrival to the ED, he was afebrile with blood pressure 158/83 mm Hg and pulse rate 61 beats/min. There were no focal neurological deficits; he was alert and oriented x4, and he did not demonstrate any memory deficits.  Reviewed WBC 11.5, potassium 2.8, glucose 158.  Urinalysis negative for UTI.  UDS negative.  Noncontrast head CT showed no acute intracranial abnormality.  CT chest showed no acute cardiopulmonary abnormality.  CT cervical spine with no acute  fracture or pathology.  Hospitalist asked to admit for further monitoring and management.  HOSPITAL COURSE:  Patient was admitted to telemetry.  His EKG showed bradycardia and Coreg was stopped.  CT head no acute abnormality.  Patient was worked up with echocardiogram.  He also was worked up with EEG.  Troponins were negative.  Potassium was replaced aggressively.  Emphysema was stable patient continued home dose inhalers.  Tobacco cessation has been counseled to the patient.  Patient was worked up with CT cervical spine which showed no fractures.  Was also worked up with CT chest which showed no pneumomediastinum and showed chronic L1 compression deformity.  Patient ambulates with help of walker.  He will be discharged back to assisted living facility.  We will hold off on his Coreg for bradycardia.  Continue amlodipine for hypertension.  Follow-up with primary care physician in the clinic.  CONSULTS OBTAINED:    DRUG ALLERGIES:   Allergies  Allergen Reactions  . Bupropion Other (See Comments)    Delusions   . Effexor [Venlafaxine Hcl] Other (See Comments)    DELUSIONAL   . Amoxicillin Rash  . Clindamycin/Lincomycin Diarrhea    DISCHARGE MEDICATIONS:   Allergies as of 06/06/2019      Reactions   Bupropion Other (See Comments)   Delusions    Effexor [venlafaxine Hcl] Other (See Comments)   DELUSIONAL   Amoxicillin Rash   Clindamycin/lincomycin Diarrhea      Medication List    STOP taking these medications   aspirin 325 MG EC tablet   bisacodyl 5 MG EC tablet Commonly  known as: DULCOLAX   carvedilol 12.5 MG tablet Commonly known as: COREG   docusate sodium 100 MG capsule Commonly known as: COLACE   folic acid 1 MG tablet Commonly known as: FOLVITE   HYDROcodone-acetaminophen 5-325 MG tablet Commonly known as: NORCO/VICODIN   menthol-cetylpyridinium 3 MG lozenge Commonly known as: CEPACOL   methocarbamol 500 MG tablet Commonly known as: ROBAXIN   multivitamin  with minerals Tabs tablet   nicotine 14 mg/24hr patch Commonly known as: NICODERM CQ - dosed in mg/24 hours   sertraline 50 MG tablet Commonly known as: ZOLOFT   thiamine 100 MG tablet   traMADol 50 MG tablet Commonly known as: ULTRAM     TAKE these medications   acetaminophen 500 MG tablet Commonly known as: TYLENOL Take 1,000 mg by mouth 2 (two) times daily.   amLODipine 10 MG tablet Commonly known as: NORVASC Take 10 mg by mouth daily.   loperamide 2 MG tablet Commonly known as: Anti-Diarrheal Take 1-2 tablets (2-4 mg total) by mouth as needed for diarrhea or loose stools. Take 2 tablets after 1st loose stool, then 1 tablet for each episode after   loratadine 10 MG tablet Commonly known as: CLARITIN Take 10 mg by mouth daily as needed for allergies.   testosterone cypionate 200 MG/ML injection Commonly known as: DEPOTESTOSTERONE CYPIONATE Inject 100 mg into the muscle every Thursday.   traZODone 50 MG tablet Commonly known as: DESYREL Take 0.5 tablets (25 mg total) by mouth at bedtime as needed for sleep. What changed:   how much to take  when to take this   vitamin B-12 1000 MCG tablet Commonly known as: CYANOCOBALAMIN Take 1,000 mcg by mouth daily.       Today  Patient seen and evaluated today Tolerating diet well Hemodynamically stable VITAL SIGNS:  BP 127/55, Temp (8.7 F, PR 59/min, Sat : 98 %  I/O:    Intake/Output Summary (Last 24 hours) at 06/06/2019 1141 Last data filed at 06/06/2019 1005 Gross per 24 hour  Intake 919.6 ml  Output 475 ml  Net 444.6 ml    PHYSICAL EXAMINATION:  Physical Exam  GENERAL:  68 y.o.-year-old patient lying in the bed with no acute distress.  LUNGS: Normal breath sounds bilaterally, no wheezing, rales,rhonchi or crepitation. No use of accessory muscles of respiration.  CARDIOVASCULAR: S1, S2 normal. No murmurs, rubs, or gallops.  ABDOMEN: Soft, non-tender, non-distended. Bowel sounds present. No  organomegaly or mass.  NEUROLOGIC: Moves all 4 extremities. PSYCHIATRIC: The patient is alert and oriented x 3.  SKIN: No obvious rash, lesion, or ulcer.   DATA REVIEW:   CBC Recent Labs  Lab 06/06/19 0425  WBC 8.9  HGB 12.2*  HCT 35.6*  PLT 207    Chemistries  Recent Labs  Lab 06/06/19 0425  NA 139  K 3.4*  CL 107  CO2 23  GLUCOSE 113*  BUN 11  CREATININE 0.77  CALCIUM 8.4*  MG 1.9    Cardiac Enzymes No results for input(s): TROPONINI in the last 168 hours.  Microbiology Results  Results for orders placed or performed during the hospital encounter of 06/05/19  SARS CORONAVIRUS 2 (TAT 6-24 HRS) Nasopharyngeal Nasopharyngeal Swab     Status: None   Collection Time: 06/05/19  8:48 PM   Specimen: Nasopharyngeal Swab  Result Value Ref Range Status   SARS Coronavirus 2 NEGATIVE NEGATIVE Final    Comment: (NOTE) SARS-CoV-2 target nucleic acids are NOT DETECTED. The SARS-CoV-2 RNA is generally detectable in upper  and lower respiratory specimens during the acute phase of infection. Negative results do not preclude SARS-CoV-2 infection, do not rule out co-infections with other pathogens, and should not be used as the sole basis for treatment or other patient management decisions. Negative results must be combined with clinical observations, patient history, and epidemiological information. The expected result is Negative. Fact Sheet for Patients: HairSlick.no Fact Sheet for Healthcare Providers: quierodirigir.com This test is not yet approved or cleared by the [place] FDA and  has been authorized for detection and/or diagnosis of SARS-CoV-2 by FDA under an Emergency Use Authorization (EUA). This EUA will remain  in effect (meaning this test can be used) for the duration of the COVID-19 declaration under Section 56 4(b)(1) of the Act, 21 U.S.C. section 360bbb-3(b)(1), unless the authorization is terminated  or revoked sooner. Performed at [facility], [address] [place], [place] [address]     RADIOLOGY:  Ct Head Wo Contrast  Result Date: 06/05/2019 CLINICAL DATA:  Altered mental status EXAM: CT HEAD WITHOUT CONTRAST TECHNIQUE: Contiguous axial images were obtained from the base of the skull through the vertex without intravenous contrast. COMPARISON:  CT brain 06/07/2018 FINDINGS: Brain: No acute territorial infarction, hemorrhage or intracranial mass. Mild atrophy and small vessel ischemic changes of the white matter. Stable ventricle size Vascular: No hyperdense vessels.  Carotid vascular calcification Skull: Normal. Negative for fracture or focal lesion. Sinuses/Orbits: Age indeterminate nasal bone deformity. Patchy mucosal thickening in the ethmoid sinuses. Other: Multiple foci of gas within the sub occipital and neck soft tissues. Foci of gas external to the upper spinal canal. IMPRESSION: 1. No CT evidence for acute intracranial abnormality. Atrophy and small vessel ischemic changes of the white matter. 2. Multiple foci of soft tissue gas within the head and neck of uncertain source. Suggest chest radiography to evaluate for soft tissue emphysema and or pneumomediastinum. Electronically Signed   By: Jasmine Pang M.D.   On: 06/05/2019 18:59   Ct Chest Wo Contrast  Result Date: 06/05/2019 CLINICAL DATA:  Initial evaluation for acute altered mental status, question pneumomediastinum. EXAM: CT CHEST WITHOUT CONTRAST TECHNIQUE: Multidetector CT imaging of the chest was performed following the standard protocol without IV contrast. COMPARISON:  Prior radiograph from 06/07/2018. FINDINGS: Cardiovascular: Intrathoracic aorta of normal caliber without aneurysm. Mild scattered aortic atherosclerosis. Partially visualized great vessels grossly within normal limits. Heart size normal. No pericardial effusion. Scattered 3 vessel coronary artery calcifications. Limited noncontrast evaluation of the  pulmonary arterial tree grossly unremarkable. Mediastinum/Nodes: Thyroid within normal limits. No pathologically enlarged mediastinal, hilar, or axillary lymph nodes. Small amount of layering fluid noted within the mid esophageal lumen. Esophagus otherwise unremarkable. No pneumomediastinum. Lungs/Pleura: Tracheobronchial tree intact and patent. Mild scattered linear atelectatic changes noted within the right middle lobe and lingula as well as the left lower lobe. No focal infiltrates or consolidative opacity. No pulmonary edema or pleural effusion. No pneumothorax. No worrisome pulmonary nodule or mass. Upper Abdomen: Visualized upper abdomen demonstrates no acute finding. Musculoskeletal: No acute osseous abnormality. No discrete lytic or blastic osseous lesions. Chronic L1 compression deformity noted. IMPRESSION: 1. No CT evidence for pneumomediastinum or other acute cardiopulmonary abnormality. 2. Mild scattered linear atelectatic changes within the lungs bilaterally. No other active cardiopulmonary disease identified. 3. Aortic atherosclerosis with three-vessel coronary artery calcifications. 4. Chronic L1 compression deformity. Electronically Signed   By: Rise Mu M.D.   On: 06/05/2019 20:50   Ct Cervical Spine Wo Contrast  Result Date: 06/05/2019 CLINICAL  DATA:  Altered mental status EXAM: CT CERVICAL SPINE WITHOUT CONTRAST TECHNIQUE: Multidetector CT imaging of the cervical spine was performed without intravenous contrast. Multiplanar CT image reconstructions were also generated. COMPARISON:  None. FINDINGS: Alignment: There is a minimal anterolisthesis of C4 on C5. Skull base and vertebrae: Visualized skull base is intact. No atlanto-occipital dissociation. The vertebral body heights are well maintained. No fracture or pathologic osseous lesion seen. Soft tissues and spinal canal: The visualized paraspinal soft tissues are unremarkable. No prevertebral soft tissue swelling is seen. The  spinal canal is grossly unremarkable, no large epidural collection or significant canal narrowing. Disc levels: Disc osteophyte complex and uncovertebral osteophytes are most notable at C4-C5 and C5-C6. No significant canal stenosis however is noted. Upper chest: The lung apices are clear. Thoracic inlet is within normal limits. Other: None IMPRESSION: No acute fracture or malalignment of the spine. Electronically Signed   By: Prudencio Pair M.D.   On: 06/05/2019 20:45    Follow up with PCP in 1 week.  Management plans discussed with the patient, family and they are in agreement.  CODE STATUS: Full code    Code Status Orders  (From admission, onward)         Start     Ordered   06/05/19 2150  Full code  Continuous     06/05/19 2153        Code Status History    Date Active Date Inactive Code Status Order ID Comments User Context   06/07/2018 1135 06/10/2018 1837 Full Code [id number]  Epifanio Lesches, MD ED   02/14/2017 1632 02/16/2017 1509 Full Code [id number]  Fritzi Mandes, MD Inpatient   02/09/2017 1809 02/13/2017 1848 Full Code [id number]  Vaughan Basta, MD ED   01/19/2017 1802 01/24/2017 1934 Full Code [id number]  Nicholes Mango, MD Inpatient   Advance Care Planning Activity      TOTAL TIME TAKING CARE OF THIS PATIENT ON DAY OF DISCHARGE: more than 34 minutes.   Saundra Shelling M.D on 06/06/2019 at 11:41 AM  Between 7am to 6pm - Pager - [phone]  After 6pm go to [email or web address] - password [email or web address] [place] [facility]  Office  [phone]  CC: Primary care physician; [facility]  Note: This dictation was prepared with Dragon dictation along with smaller phrase technology. Any transcriptional errors that result from this process are unintentional.

## 2019-06-06 NOTE — Progress Notes (Signed)
eeg completed ° °

## 2019-06-06 NOTE — NC FL2 (Signed)
[place] LEVEL OF CARE SCREENING TOOL     IDENTIFICATION  Patient Name: Jacob Villanueva Birthdate: 1950/08/18 Sex: male Admission Date (Current Location): 06/05/2019  [place] and [place] Number:  Engineering geologist and Address:  [facility], [address] [place], [place] [address]      Provider Number: [id number]  Attending Physician Name and Address:  Saundra Shelling, MD  Relative Name and Phone Number:  Mervyn Gay Sister [phone]  [phone] or Hyman Bower Niece [phone]    Current Level of Care: Hospital Recommended Level of Care: [place] Prior Approval Number:    Date Approved/Denied:   PASRR Number:    Discharge Plan: Domiciliary (Rest home)([facility])    Current Diagnoses: Patient Active Problem List   Diagnosis Date Noted  . Syncope 06/05/2019  . Fracture of femoral neck, left, closed ([place]) 06/07/2018  . Acute on chronic respiratory failure with hypoxia ([place]) 02/14/2017  . Sepsis ([place]) 02/09/2017  . Acute encephalopathy 01/19/2017    Orientation RESPIRATION BLADDER Height & Weight     Self  Normal Continent Weight: 194 lb 9.6 oz (88.3 kg) Height:  6' (182.9 cm)  BEHAVIORAL SYMPTOMS/MOOD NEUROLOGICAL BOWEL NUTRITION STATUS      Continent Diet  AMBULATORY STATUS COMMUNICATION OF NEEDS Skin   Supervision Verbally Normal                       Personal Care Assistance Level of Assistance  Bathing, Feeding, Dressing Bathing Assistance: Limited assistance Feeding assistance: Independent Dressing Assistance: Limited assistance     Functional Limitations Info  Sight, Hearing, Speech Sight Info: Adequate Hearing Info: Adequate Speech Info: Adequate    SPECIAL CARE FACTORS FREQUENCY                       Contractures Contractures Info: Not present    Additional Factors Info  Code Status, Allergies, Psychotropic Code Status Info: Full Code Allergies Info:  Bupropion and Effexor and Amoxicillin and Clindamycin/lincomycin Psychotropic Info: traZODone (DESYREL) tablet 75 mg         Current Medications (06/06/2019):  This is the current hospital active medication list Current Facility-Administered Medications  Medication Dose Route Frequency Provider Last Rate Last Dose  . 0.9 %  sodium chloride infusion   Intravenous Continuous Lang Snow, NP 75 mL/hr at 06/06/19 0700    . acetaminophen (TYLENOL) tablet 1,000 mg  1,000 mg Oral BID Lang Snow, NP   1,000 mg at 06/06/19 0954  . enoxaparin (LOVENOX) injection 40 mg  40 mg Subcutaneous Q24H Lang Snow, NP   40 mg at 06/06/19 0106  . loperamide (IMODIUM) capsule 2-4 mg  2-4 mg Oral PRN Lang Snow, NP      . loratadine (CLARITIN) tablet 10 mg  10 mg Oral Daily PRN Lang Snow, NP      . sodium chloride flush (NS) 0.9 % injection 3 mL  3 mL Intravenous Q12H Ouma, Bing Neighbors, NP      . testosterone cypionate (DEPOTESTOSTERONE CYPIONATE) injection 100 mg  100 mg Intramuscular Q Thu Ouma, Bing Neighbors, NP      . traZODone (DESYREL) tablet 75 mg  75 mg Oral QPM Ouma, Bing Neighbors, NP      . vitamin B-12 (CYANOCOBALAMIN) tablet 1,000 mcg  1,000 mcg Oral Daily Lang Snow, NP   1,000 mcg at 06/06/19 7902     Discharge  Medications: STOP taking these medications   aspirin 325 MG EC tablet   bisacodyl 5 MG EC tablet Commonly known as: DULCOLAX   carvedilol 12.5 MG tablet Commonly known as: COREG   docusate sodium 100 MG capsule Commonly known as: COLACE   folic acid 1 MG tablet Commonly known as: FOLVITE   HYDROcodone-acetaminophen 5-325 MG tablet Commonly known as: NORCO/VICODIN   menthol-cetylpyridinium 3 MG lozenge Commonly known as: CEPACOL   methocarbamol 500 MG tablet Commonly known as: ROBAXIN   multivitamin with minerals Tabs tablet   nicotine 14 mg/24hr patch Commonly known as:  NICODERM CQ - dosed in mg/24 hours   sertraline 50 MG tablet Commonly known as: ZOLOFT   thiamine 100 MG tablet   traMADol 50 MG tablet Commonly known as: ULTRAM     TAKE these medications   acetaminophen 500 MG tablet Commonly known as: TYLENOL Take 1,000 mg by mouth 2 (two) times daily.   amLODipine 10 MG tablet Commonly known as: NORVASC Take 10 mg by mouth daily.   loperamide 2 MG tablet Commonly known as: Anti-Diarrheal Take 1-2 tablets (2-4 mg total) by mouth as needed for diarrhea or loose stools. Take 2 tablets after 1st loose stool, then 1 tablet for each episode after   loratadine 10 MG tablet Commonly known as: CLARITIN Take 10 mg by mouth daily as needed for allergies.   testosterone cypionate 200 MG/ML injection Commonly known as: DEPOTESTOSTERONE CYPIONATE Inject 100 mg into the muscle every Thursday.   traZODone 50 MG tablet Commonly known as: DESYREL Take 0.5 tablets (25 mg total) by mouth at bedtime as needed for sleep. What changed:   how much to take  when to take this   vitamin B-12 1000 MCG tablet Commonly known as: CYANOCOBALAMIN Take 1,000 mcg by mouth daily.     Relevant Imaging Results:  Relevant Lab Results:   Additional Information SSN [id number]  Darleene Cleaver, LCSW

## 2019-06-06 NOTE — Procedures (Signed)
ELECTROENCEPHALOGRAM REPORT   Patient: Jacob Villanueva       Room #: 250A-AA EEG No. ID: [id number] Age: 68 y.o.        Sex: male Referring Physician: Pyreddy Report Date:  06/06/2019        Interpreting Physician: Alexis Goodell  History: Jacob Villanueva is an 68 y.o. male with syncope  Medications:  Desyrel, Testosterone  Conditions of Recording:  This is a 21 channel routine scalp EEG performed with bipolar and monopolar montages arranged in accordance to the international 10/20 system of electrode placement. One channel was dedicated to EKG recording.  The patient is in the awake state.  Description:  The waking background activity consists of a low voltage, symmetrical, fairly well organized, 10 Hz alpha activity, seen from the parieto-occipital and posterior temporal regions.  Low voltage fast activity, poorly organized, is seen anteriorly and is at times superimposed on more posterior regions.  A mixture of theta and alpha rhythms are seen from the central and temporal regions. The patient does not drowse or sleep. No epileptiform activity is noted.   Hyperventilation was not performed.  Intermittent photic stimulation was performed but failed to illicit any change in the tracing.    IMPRESSION: Normal electroencephalogram, awake and with activation procedures. There are no focal lateralizing or epileptiform features.   Alexis Goodell, MD Neurology [phone] 06/06/2019, 3:12 PM

## 2019-06-06 NOTE — Progress Notes (Signed)
Discharge instructions explained to pt and pts sister/ verbalized an understanding/ iv and tele removed/ RX given to pt/ discharge packet given to pts sister to give to facility / Sister to transport back to [place]

## 2019-06-06 NOTE — Plan of Care (Signed)
Pleasantly confused on arrival to floor.  Admission database unable to be completed at this time.

## 2019-06-06 NOTE — Progress Notes (Signed)
*  PRELIMINARY RESULTS* Echocardiogram 2D Echocardiogram has been performed.  Jacob Villanueva 06/06/2019, 9:38 AM

## 2019-06-06 NOTE — H&P (Signed)
[facility] NAME: Jacob Villanueva    MR#:  [id number]  DATE OF BIRTH:  Mar 17, 1951  DATE OF ADMISSION:  06/05/2019  PRIMARY CARE PHYSICIAN: [facility]   REQUESTING/REFERRING PHYSICIAN: Delman Kitten MD  CHIEF COMPLAINT:   Chief Complaint  Patient presents with  . Altered Mental Status    HISTORY OF PRESENT ILLNESS:  68 y.o. male with pertinent past medical history of TBI, hypertension, COPD, mood disorder, anemia, and EtOH abuse presenting to the ED with near syncopal event.  Patient state he was outside smoking and the next thing he recalled he was in the ambulance being transported to the ED.  Per patient's sister who is currently at the bedside, staff at the facility reported that patient was sitting when suddenly he was noted to be slumping over looking pale.  Per caregiver who witnessed the event, patient was unresponsive and diaphoretic.  Was no witnessed seizure activity but he seemed to be passed out briefly.  He did lose control of his bladder.  He has had similar episode in the past without findings to suggest cardiac or neurologic source of his near syncope.  On arrival to the ED, he was afebrile with blood pressure 158/83 mm Hg and pulse rate 61 beats/min. There were no focal neurological deficits; he was alert and oriented x4, and he did not demonstrate any memory deficits.  Reviewed WBC 11.5, potassium 2.8, glucose 158.  Urinalysis negative for UTI.  UDS negative.  Noncontrast head CT showed no acute intracranial abnormality.  CT chest showed no acute cardiopulmonary abnormality.  CT cervical spine with no acute fracture or pathology.  Hospitalist asked to admit for further monitoring and management.  PAST MEDICAL HISTORY:   Past Medical History:  Diagnosis Date  . Bacteremia 01/2017  . Hypertension   . TBI (traumatic brain injury) ([place])     PAST SURGICAL HISTORY:   Past Surgical History:  Procedure  Laterality Date  . APPENDECTOMY    . FRACTURE SURGERY    . INTRAMEDULLARY (IM) NAIL INTERTROCHANTERIC Left 06/07/2018   Procedure: INTRAMEDULLARY (IM) NAIL INTERTROCHANTRIC;  Surgeon: Hessie Knows, MD;  Location: [facility] ORS;  Service: Orthopedics;  Laterality: Left;    SOCIAL HISTORY:   Social History   Tobacco Use  . Smoking status: Current Every Day Smoker    Packs/day: 2.00  . Smokeless tobacco: Never Used  Substance Use Topics  . Alcohol use: Yes    FAMILY HISTORY:  History reviewed. No pertinent family history.  DRUG ALLERGIES:   Allergies  Allergen Reactions  . Bupropion Other (See Comments)    Delusions   . Effexor [Venlafaxine Hcl] Other (See Comments)    DELUSIONAL   . Amoxicillin Rash  . Clindamycin/Lincomycin Diarrhea    REVIEW OF SYSTEMS:   Review of Systems  Unable to perform ROS: Mental acuity   MEDICATIONS AT HOME:   Prior to Admission medications   Medication Sig Start Date End Date Taking? Authorizing Provider  acetaminophen (TYLENOL) 500 MG tablet Take 1,000 mg by mouth 2 (two) times daily.   Yes [provider]  amLODipine (NORVASC) 10 MG tablet Take 10 mg by mouth daily.   Yes [provider]  carvedilol (COREG) 12.5 MG tablet Take 12.5 mg by mouth 2 (two) times daily.   Yes [provider]  loperamide (ANTI-DIARRHEAL) 2 MG tablet Take 2-4 mg by mouth as needed for diarrhea or loose stools. Take 2 tablets after 1st  loose stool, then 1 tablet for each episode after   Yes [provider]  loratadine (CLARITIN) 10 MG tablet Take 10 mg by mouth daily as needed for allergies.   Yes [provider]  testosterone cypionate (DEPOTESTOSTERONE CYPIONATE) 200 MG/ML injection Inject 100 mg into the muscle every Thursday. 05/30/19  Yes [provider]  traZODone (DESYREL) 50 MG tablet Take 0.5 tablets (25 mg total) by mouth at bedtime as needed for sleep. Patient taking differently: Take 75 mg by mouth  every evening.  06/10/18  Yes Altamese Dilling, MD  vitamin B-12 (CYANOCOBALAMIN) 1000 MCG tablet Take 1,000 mcg by mouth daily.   Yes [provider]  aspirin EC 325 MG EC tablet Take 1 tablet (325 mg total) by mouth daily with breakfast. Patient not taking: Reported on 06/05/2019 06/09/18   Dedra Skeens, PA-C  bisacodyl (DULCOLAX) 5 MG EC tablet Take 1 tablet (5 mg total) by mouth daily as needed for moderate constipation. Patient not taking: Reported on 06/05/2019 06/10/18   Altamese Dilling, MD  docusate sodium (COLACE) 100 MG capsule Take 1 capsule (100 mg total) by mouth 2 (two) times daily. Patient not taking: Reported on 06/05/2019 06/10/18   Altamese Dilling, MD  folic acid (FOLVITE) 1 MG tablet Take 1 tablet (1 mg total) by mouth daily. Patient not taking: Reported on 06/05/2019 06/11/18   Altamese Dilling, MD  HYDROcodone-acetaminophen (NORCO/VICODIN) 5-325 MG tablet Take 1-2 tablets by mouth every 4 (four) hours as needed for moderate pain (pain score 4-6). Patient not taking: Reported on 06/05/2019 06/09/18   Dedra Skeens, PA-C  menthol-cetylpyridinium (CEPACOL) 3 MG lozenge Take 1 lozenge (3 mg total) by mouth as needed for sore throat (sore throat). Patient not taking: Reported on 06/05/2019 06/10/18   Altamese Dilling, MD  methocarbamol (ROBAXIN) 500 MG tablet Take 1 tablet (500 mg total) by mouth every 6 (six) hours as needed for muscle spasms. Patient not taking: Reported on 06/05/2019 06/10/18   Altamese Dilling, MD  Multiple Vitamin (MULTIVITAMIN WITH MINERALS) TABS tablet Take 1 tablet by mouth daily. Patient not taking: Reported on 06/05/2019 06/11/18   Altamese Dilling, MD  nicotine (NICODERM CQ - DOSED IN MG/24 HOURS) 14 mg/24hr patch Place 1 patch (14 mg total) onto the skin daily. Patient not taking: Reported on 06/05/2019 06/11/18   Altamese Dilling, MD  sertraline (ZOLOFT) 50 MG tablet Take 1 tablet (50 mg total)  by mouth every morning. Patient not taking: Reported on 06/05/2019 06/10/18   Altamese Dilling, MD  thiamine 100 MG tablet Take 1 tablet (100 mg total) by mouth daily. Patient not taking: Reported on 06/05/2019 06/11/18   Altamese Dilling, MD  traMADol (ULTRAM) 50 MG tablet Take 1 tablet (50 mg total) by mouth every 6 (six) hours. Patient not taking: Reported on 06/05/2019 06/09/18   Dedra Skeens, PA-C      VITAL SIGNS:  Blood pressure (!) 145/74, pulse (!) 59, temperature 98.2 F (36.8 C), temperature source Oral, resp. rate 16, height 6' (1.829 m), weight 88.3 kg, SpO2 99 %.  PHYSICAL EXAMINATION:   Physical Exam  GENERAL:  68 y.o.-year-old patient lying in the bed with no acute distress.  EYES: Pupils equal, round, reactive to light and accommodation. No scleral icterus. Extraocular muscles intact.  HEENT: Head atraumatic, normocephalic. Oropharynx and nasopharynx clear.  NECK:  Supple, no jugular venous distention. No thyroid enlargement, no tenderness.  LUNGS: Normal breath sounds bilaterally, no wheezing, rales,rhonchi or crepitation. No use of accessory muscles of respiration.  CARDIOVASCULAR: S1, S2 normal. No murmurs, rubs, or gallops.  ABDOMEN: Soft, nontender, nondistended. Bowel sounds present. No organomegaly or mass.  EXTREMITIES: No pedal edema, cyanosis, or clubbing.  NEUROLOGIC: Cranial nerves II through XII are intact. Muscle strength 5/5 in all extremities. Sensation intact. Gait not checked.  PSYCHIATRIC: The patient is alert and oriented x 3.  SKIN: No obvious rash, lesion, or ulcer.   DATA REVIEWED:  LABORATORY PANEL:   CBC Recent Labs  Lab 06/05/19 1827  WBC 11.5*  HGB 13.4  HCT 38.7*  PLT 244   ------------------------------------------------------------------------------------------------------------------  Chemistries  Recent Labs  Lab 06/05/19 1827  NA 139  K 2.8*  CL 106  CO2 24  GLUCOSE 158*  BUN 13  CREATININE 1.01   CALCIUM 8.8*   ------------------------------------------------------------------------------------------------------------------  Cardiac Enzymes No results for input(s): TROPONINI in the last 168 hours. ------------------------------------------------------------------------------------------------------------------  RADIOLOGY:  Ct Head Wo Contrast  Result Date: 06/05/2019 CLINICAL DATA:  Altered mental status EXAM: CT HEAD WITHOUT CONTRAST TECHNIQUE: Contiguous axial images were obtained from the base of the skull through the vertex without intravenous contrast. COMPARISON:  CT brain 06/07/2018 FINDINGS: Brain: No acute territorial infarction, hemorrhage or intracranial mass. Mild atrophy and small vessel ischemic changes of the white matter. Stable ventricle size Vascular: No hyperdense vessels.  Carotid vascular calcification Skull: Normal. Negative for fracture or focal lesion. Sinuses/Orbits: Age indeterminate nasal bone deformity. Patchy mucosal thickening in the ethmoid sinuses. Other: Multiple foci of gas within the sub occipital and neck soft tissues. Foci of gas external to the upper spinal canal. IMPRESSION: 1. No CT evidence for acute intracranial abnormality. Atrophy and small vessel ischemic changes of the white matter. 2. Multiple foci of soft tissue gas within the head and neck of uncertain source. Suggest chest radiography to evaluate for soft tissue emphysema and or pneumomediastinum. Electronically Signed   By: Jasmine PangKim  Fujinaga M.D.   On: 06/05/2019 18:59   Ct Chest Wo Contrast  Result Date: 06/05/2019 CLINICAL DATA:  Initial evaluation for acute altered mental status, question pneumomediastinum. EXAM: CT CHEST WITHOUT CONTRAST TECHNIQUE: Multidetector CT imaging of the chest was performed following the standard protocol without IV contrast. COMPARISON:  Prior radiograph from 06/07/2018. FINDINGS: Cardiovascular: Intrathoracic aorta of normal caliber without aneurysm. Mild  scattered aortic atherosclerosis. Partially visualized great vessels grossly within normal limits. Heart size normal. No pericardial effusion. Scattered 3 vessel coronary artery calcifications. Limited noncontrast evaluation of the pulmonary arterial tree grossly unremarkable. Mediastinum/Nodes: Thyroid within normal limits. No pathologically enlarged mediastinal, hilar, or axillary lymph nodes. Small amount of layering fluid noted within the mid esophageal lumen. Esophagus otherwise unremarkable. No pneumomediastinum. Lungs/Pleura: Tracheobronchial tree intact and patent. Mild scattered linear atelectatic changes noted within the right middle lobe and lingula as well as the left lower lobe. No focal infiltrates or consolidative opacity. No pulmonary edema or pleural effusion. No pneumothorax. No worrisome pulmonary nodule or mass. Upper Abdomen: Visualized upper abdomen demonstrates no acute finding. Musculoskeletal: No acute osseous abnormality. No discrete lytic or blastic osseous lesions. Chronic L1 compression deformity noted. IMPRESSION: 1. No CT evidence for pneumomediastinum or other acute cardiopulmonary abnormality. 2. Mild scattered linear atelectatic changes within the lungs bilaterally. No other active cardiopulmonary disease identified. 3. Aortic atherosclerosis with three-vessel coronary artery calcifications. 4. Chronic L1 compression deformity. Electronically Signed   By: Rise MuBenjamin  McClintock M.D.   On: 06/05/2019 20:50   Ct Cervical Spine Wo Contrast  Result Date: 06/05/2019 CLINICAL DATA:  Altered mental status EXAM: CT CERVICAL SPINE  WITHOUT CONTRAST TECHNIQUE: Multidetector CT imaging of the cervical spine was performed without intravenous contrast. Multiplanar CT image reconstructions were also generated. COMPARISON:  None. FINDINGS: Alignment: There is a minimal anterolisthesis of C4 on C5. Skull base and vertebrae: Visualized skull base is intact. No atlanto-occipital dissociation. The  vertebral body heights are well maintained. No fracture or pathologic osseous lesion seen. Soft tissues and spinal canal: The visualized paraspinal soft tissues are unremarkable. No prevertebral soft tissue swelling is seen. The spinal canal is grossly unremarkable, no large epidural collection or significant canal narrowing. Disc levels: Disc osteophyte complex and uncovertebral osteophytes are most notable at C4-C5 and C5-C6. No significant canal stenosis however is noted. Upper chest: The lung apices are clear. Thoracic inlet is within normal limits. Other: None IMPRESSION: No acute fracture or malalignment of the spine. Electronically Signed   By: Jonna Clark M.D.   On: 06/05/2019 20:45    EKG:  EKG: normal EKG, normal sinus rhythm, unchanged from previous tracings, normal sinus rhythm. Heart rate 55 Cures 100 QTc 445 Normal sinus rhythm, no evidence of acute ischemia denoted  IMPRESSION AND PLAN:   68 y.o. male with pertinent past medical history of TBI, hypertension, COPD, mood disorder, anemia, and EtOH abuse presenting to the ED with near syncopal event.  1. Syncope - Etiology unknown: no signs of Hypoglycemia, Hypoxia, Shock, Hyperventilation, Anemia, Alcoholic  - Admit to telemetry unit - Recent EKG shows NSR rate of 61 - Telemetry monitoring shows no arrthymias - Check Orthostatic Vital Signs - Head CT negative for acute intracranial abnormality  - Obtain EEG - Check Cardiac Echo  - Check TSH and B12  2. Hypokalemia - Replete with IV potassium  - Recheck + Mag  3. Hypertension - Continue amlodipine and Coreg  4. COPD -no evidence of exacerbation - As needed duo nebs  5. Tobacco abuse - Smoking cessation counseling - Nicotine patch offered  6. DVT prophylaxis - Enoxaparin  SubQ   All the records are reviewed and case discussed with ED provider. Management plans discussed with the patient, family and they are in agreement.  CODE STATUS: FULL  TOTAL TIME TAKING  CARE OF THIS PATIENT: 50 minutes.    on 06/06/2019 at 2:07 AM   Webb Silversmith, DNP, FNP-BC [facility] Nurse Practitioner Between 7am to 6pm - Pager [phone]  After 6pm go to [email or web address] - Social research officer, government  [facility]  Office  [phone]  CC: Primary care physician; [facility]

## 2019-06-06 NOTE — Care Management Obs Status (Signed)
[facility] NOTIFICATION   Patient Details  Name: Jacob Villanueva MRN: [id number] Date of Birth: 04/20/1951   Medicare Observation Status Notification Given:  Yes    Cecil Cobbs 06/06/2019, 2:31 PM

## 2019-06-06 NOTE — TOC Transition Note (Signed)
Transition of Care [facility]) - CM/SW Discharge Note   Patient Details  Name: Jacob Villanueva MRN: [id number] Date of Birth: Jul 10, 1951  Transition of Care [facility] CM/SW Contact:  Ross Ludwig, LCSW Phone Number: 06/06/2019, 3:33 PM   Clinical Narrative:     Patient is a 68 year old male who is a resident at [facility].  Patient has some dementia, assessment completed by speaking to his sister.  Patient has been living at [facility] for a few months, patient and his sister have been pleased with the care.  Patient's sister would like him to return back to ALF.  CSW contacted [facility], and spoke to [place], he is agreeable to having patient return back to ALF.   Final next level of care: Assisted Living Barriers to Discharge: Barriers Resolved   Patient Goals and CMS Choice Patient states their goals for this hospitalization and ongoing recovery are:: To return back to [facility]. CMS Medicare.gov Compare Post Acute Care list provided to:: Patient Represenative (must comment) Choice offered to / list presented to : [facility] POA / Guardian  Discharge Placement                Patient to be transferred to facility by: Patient's sister Name of family member notified: Patient's sister will be transporting him back [facility]. Patient and family notified of of transfer: 06/06/19  Discharge Plan and Services                DME Arranged: N/A DME Agency: NA       HH Arranged: NA       Representative spoke with at [facility]: na  Social Determinants of Health ([place]) Interventions     Readmission Risk Interventions No flowsheet data found.

## 2019-07-17 ENCOUNTER — Observation Stay
Admission: EM | Admit: 2019-07-17 | Discharge: 2019-07-18 | Disposition: A | Discharge status: Skilled Nursing Facility | Payer: Medicare Other | Attending: Internal Medicine | Admitting: Internal Medicine

## 2019-07-17 ENCOUNTER — Other Ambulatory Visit: Payer: Self-pay

## 2019-07-17 ENCOUNTER — Emergency Department: Payer: Medicare Other

## 2019-07-17 ENCOUNTER — Encounter: Payer: Self-pay | Admitting: Emergency Medicine

## 2019-07-17 DIAGNOSIS — S069X9S Unspecified intracranial injury with loss of consciousness of unspecified duration, sequela: Secondary | ICD-10-CM | POA: Diagnosis not present

## 2019-07-17 DIAGNOSIS — J449 Chronic obstructive pulmonary disease, unspecified: Secondary | ICD-10-CM | POA: Diagnosis not present

## 2019-07-17 DIAGNOSIS — R454 Irritability and anger: Secondary | ICD-10-CM | POA: Insufficient documentation

## 2019-07-17 DIAGNOSIS — Z8709 Personal history of other diseases of the respiratory system: Secondary | ICD-10-CM

## 2019-07-17 DIAGNOSIS — Z79899 Other long term (current) drug therapy: Secondary | ICD-10-CM | POA: Insufficient documentation

## 2019-07-17 DIAGNOSIS — I10 Essential (primary) hypertension: Secondary | ICD-10-CM | POA: Diagnosis not present

## 2019-07-17 DIAGNOSIS — F17210 Nicotine dependence, cigarettes, uncomplicated: Secondary | ICD-10-CM | POA: Diagnosis not present

## 2019-07-17 DIAGNOSIS — S069X9A Unspecified intracranial injury with loss of consciousness of unspecified duration, initial encounter: Secondary | ICD-10-CM | POA: Diagnosis present

## 2019-07-17 DIAGNOSIS — E876 Hypokalemia: Secondary | ICD-10-CM

## 2019-07-17 DIAGNOSIS — F39 Unspecified mood [affective] disorder: Secondary | ICD-10-CM | POA: Insufficient documentation

## 2019-07-17 DIAGNOSIS — Z87820 Personal history of traumatic brain injury: Secondary | ICD-10-CM | POA: Insufficient documentation

## 2019-07-17 DIAGNOSIS — G319 Degenerative disease of nervous system, unspecified: Secondary | ICD-10-CM | POA: Insufficient documentation

## 2019-07-17 DIAGNOSIS — Z20828 Contact with and (suspected) exposure to other viral communicable diseases: Secondary | ICD-10-CM | POA: Diagnosis not present

## 2019-07-17 DIAGNOSIS — R55 Syncope and collapse: Secondary | ICD-10-CM | POA: Diagnosis present

## 2019-07-17 DIAGNOSIS — S069XAA Unspecified intracranial injury with loss of consciousness status unknown, initial encounter: Secondary | ICD-10-CM | POA: Diagnosis present

## 2019-07-17 LAB — CBC WITH DIFFERENTIAL/PLATELET
Abs Immature Granulocytes: 0.05 10*3/uL (ref 0.00–0.07)
Basophils Absolute: 0.1 10*3/uL (ref 0.0–0.1)
Basophils Relative: 1 %
Eosinophils Absolute: 0.5 10*3/uL (ref 0.0–0.5)
Eosinophils Relative: 5 %
HCT: 45.3 % (ref 39.0–52.0)
Hemoglobin: 15.1 g/dL (ref 13.0–17.0)
Immature Granulocytes: 1 %
Lymphocytes Relative: 32 %
Lymphs Abs: 3.2 10*3/uL (ref 0.7–4.0)
MCH: 31.1 pg (ref 26.0–34.0)
MCHC: 33.3 g/dL (ref 30.0–36.0)
MCV: 93.4 fL (ref 80.0–100.0)
Monocytes Absolute: 0.7 10*3/uL (ref 0.1–1.0)
Monocytes Relative: 7 %
Neutro Abs: 5.4 10*3/uL (ref 1.7–7.7)
Neutrophils Relative %: 54 %
Platelets: 311 10*3/uL (ref 150–400)
RBC: 4.85 MIL/uL (ref 4.22–5.81)
RDW: 12.6 % (ref 11.5–15.5)
WBC: 9.8 10*3/uL (ref 4.0–10.5)
nRBC: 0 % (ref 0.0–0.2)

## 2019-07-17 LAB — URINALYSIS, COMPLETE (UACMP) WITH MICROSCOPIC
Bacteria, UA: NONE SEEN
Bilirubin Urine: NEGATIVE
Glucose, UA: NEGATIVE mg/dL
Hgb urine dipstick: NEGATIVE
Ketones, ur: NEGATIVE mg/dL
Leukocytes,Ua: NEGATIVE
Nitrite: NEGATIVE
Protein, ur: NEGATIVE mg/dL
Specific Gravity, Urine: 1.014 (ref 1.005–1.030)
Squamous Epithelial / HPF: NONE SEEN (ref 0–5)
pH: 6 (ref 5.0–8.0)

## 2019-07-17 LAB — COMPREHENSIVE METABOLIC PANEL
ALT: 11 U/L (ref 0–44)
AST: 17 U/L (ref 15–41)
Albumin: 4.5 g/dL (ref 3.5–5.0)
Alkaline Phosphatase: 91 U/L (ref 38–126)
Anion gap: 13 (ref 5–15)
BUN: 10 mg/dL (ref 8–23)
CO2: 24 mmol/L (ref 22–32)
Calcium: 9 mg/dL (ref 8.9–10.3)
Chloride: 101 mmol/L (ref 98–111)
Creatinine, Ser: 0.83 mg/dL (ref 0.61–1.24)
GFR calc Af Amer: >60 mL/min (ref >60)
GFR calc non Af Amer: >60 mL/min (ref >60)
Glucose, Bld: 142 mg/dL — ABNORMAL HIGH (ref 70–99)
Potassium: 3.1 mmol/L — ABNORMAL LOW (ref 3.5–5.1)
Sodium: 138 mmol/L (ref 135–145)
Total Bilirubin: 0.8 mg/dL (ref 0.3–1.2)
Total Protein: 7.4 g/dL (ref 6.5–8.1)

## 2019-07-17 LAB — TROPONIN I (HIGH SENSITIVITY): Troponin I (High Sensitivity): 38 ng/L — ABNORMAL HIGH (ref <18)

## 2019-07-17 LAB — URINE DRUG SCREEN, QUALITATIVE (ARMC ONLY)
Amphetamines, Ur Screen: NOT DETECTED
Barbiturates, Ur Screen: NOT DETECTED
Benzodiazepine, Ur Scrn: NOT DETECTED
Cannabinoid 50 Ng, Ur ~~LOC~~: NOT DETECTED
Cocaine Metabolite,Ur ~~LOC~~: NOT DETECTED
MDMA (Ecstasy)Ur Screen: NOT DETECTED
Methadone Scn, Ur: NOT DETECTED
Opiate, Ur Screen: NOT DETECTED
Phencyclidine (PCP) Ur S: NOT DETECTED
Tricyclic, Ur Screen: NOT DETECTED

## 2019-07-17 MED ORDER — SODIUM CHLORIDE 0.9% FLUSH: 3.0000 mL | Freq: Two times a day (BID) | INTRAVENOUS | Status: DC

## 2019-07-17 MED ORDER — NICOTINE 21 MG/24HR TD PT24: 21.0000 mg | MEDICATED_PATCH | Freq: Every day | TRANSDERMAL | Status: DC

## 2019-07-17 MED ORDER — ENOXAPARIN SODIUM 40 MG/0.4ML ~~LOC~~ SOLN
40.0000 mg | SUBCUTANEOUS | Status: DC
  Administered 2019-07-18: 40 mg via SUBCUTANEOUS
  Filled 2019-07-17 (×2): qty 0.4

## 2019-07-17 MED ORDER — POTASSIUM CHLORIDE 10 MEQ/100ML IV SOLN
10.0000 meq | INTRAVENOUS | Status: AC
  Administered 2019-07-18 (×2): 10 meq via INTRAVENOUS
  Filled 2019-07-17 (×2): qty 100

## 2019-07-17 NOTE — H&P (Signed)
History and Physical    Jacob Villanueva IWP:[id number] DOB: 09/20/1950 DOA: 07/17/2019  PCP: [facility] - at assisted living facility Patient coming from: [facility]  I have personally briefly reviewed patient's old medical records in [place]  Chief Complaint: Syncope  HPI: Jacob Villanueva is a 68 y.o. male with medical history significant of traumatic brain injury with mood disorder, COPD, hypertension, anemia, heavy tobacco user, history of alcohol abuse who presents to the ED with syncopal episode. Sister at bedside relays the entire history as patient does not recall the event and was very irritable and states that he only wants to "get out of here." Sister recalls that patient was sitting outside on a bench while she was helping the load drinks and food into the assisted living for him.  There was another person sitting next to him on the bench who caught him as he went out of consciousness and alerted his sister.  She reports that it took a long time for him to regain consciousness.  Denies any seizure-like activity.  When he came to he was very irritable and unsure of what had occurred.  She reports that he had urinary incontinence during this episode although that occasionally happens due to history of TBI.  She reports that otherwise he was in his normal state of health per reports from his caretaker. Patient is a heavy smoker and smokes about a pack and 1/2/day and used to do more than that.  He has had at least 2 episodes of syncope prior to this.  One was around 2017 where he was found down inside his independent living facility.- no records of this as he was at the [place]. The second episode in occurred in October while he was sitting and he was hospitalized and had work-up including head CT, CT chest, EEG, troponins echocardiogram that were all negative.  Only significant finding was bradycardia and his Coreg was discontinued at that time.  Denies any alcohol or illicit drug use.  Family history includes  mother who has had a pacemaker Sister-with cardiac arrhythmia  ED Course: He was afebrile and normotensive on room air.  CBC was unremarkable with no leukocytosis or anemia.  Potassium of 3.1, glucose of 142, normal creatinine of 0.83.  Urinalysis was negative.  CT of the head showed no acute intracranial abnormalities.  Straight showed no active disease.  Review of Systems:  Unable to obtain as patient was not cooperative with answering questions  Past Medical History:  Diagnosis Date  . Bacteremia 01/2017  . Hypertension   . TBI (traumatic brain injury) [facility])     Past Surgical History:  Procedure Laterality Date  . APPENDECTOMY    . FRACTURE SURGERY    . INTRAMEDULLARY (IM) NAIL INTERTROCHANTERIC Left 06/07/2018   Procedure: INTRAMEDULLARY (IM) NAIL INTERTROCHANTRIC;  Surgeon: Jacob Knows, MD;  Location: [facility] ORS;  Service: Orthopedics;  Laterality: Left;     reports that he has been smoking. He has been smoking about 2.00 packs per day. He has never used smokeless tobacco. He reports current alcohol use. He reports that he does not use drugs.  Allergies  Allergen Reactions  . Bupropion Other (See Comments)    Delusions   . Effexor [Venlafaxine Hcl] Other (See Comments)    DELUSIONAL   . Amoxicillin Rash  . Clindamycin/Lincomycin Diarrhea      Prior to Admission medications   Medication Sig Start Date End Date Taking? Authorizing Provider  acetaminophen (TYLENOL) 500 MG tablet  Take 1,000 mg by mouth 2 (two) times daily.    [provider]  amLODipine (NORVASC) 10 MG tablet Take 10 mg by mouth daily.    [provider]  loperamide (ANTI-DIARRHEAL) 2 MG tablet Take 1-2 tablets (2-4 mg total) by mouth as needed for diarrhea or loose stools. Take 2 tablets after 1st loose stool, then 1 tablet for each episode after 06/06/19   Ihor AustinPyreddy, Pavan, MD  loratadine (CLARITIN) 10 MG tablet Take 10 mg by mouth daily as needed for allergies.     [provider]  testosterone cypionate (DEPOTESTOSTERONE CYPIONATE) 200 MG/ML injection Inject 100 mg into the muscle every Thursday. 05/30/19   [provider]  traZODone (DESYREL) 50 MG tablet Take 0.5 tablets (25 mg total) by mouth at bedtime as needed for sleep. Patient taking differently: Take 75 mg by mouth every evening.  06/10/18   Altamese DillingVachhani, Vaibhavkumar, MD  vitamin B-12 (CYANOCOBALAMIN) 1000 MCG tablet Take 1,000 mcg by mouth daily.    [provider]    Physical Exam: Vitals:   07/17/19 1821 07/17/19 1834 07/17/19 2044  BP: 136/74  (!) 149/96  Pulse: 61  64  Resp: 16  15  Temp: 97.8 F (36.6 C)    TempSrc: Oral    SpO2:   96%  Weight:  77.1 kg   Height:  6\' 1"  (1.854 m)     Constitutional: NAD, calm, comfortable, nontoxic male laying flat in bed Vitals:   07/17/19 1821 07/17/19 1834 07/17/19 2044  BP: 136/74  (!) 149/96  Pulse: 61  64  Resp: 16  15  Temp: 97.8 F (36.6 C)    TempSrc: Oral    SpO2:   96%  Weight:  77.1 kg   Height:  6\' 1"  (1.854 m)    Eyes: PERRL, lids and conjunctivae normal ENMT: Mucous membranes are moist.  Neck: normal, supple, no masses Respiratory: clear to auscultation bilaterally, no wheezing, no crackles. Normal respiratory effort. No accessory muscle use.  Cardiovascular: Regular rate and rhythm, no murmurs / rubs / gallops. No extremity edema. 2+ pedal pulses. Abdomen: no tenderness, no masses palpated. Bowel sounds positive.  Musculoskeletal: no clubbing / cyanosis. No joint deformity upper and lower extremities. Good ROM, no contractures. Normal muscle tone.  Skin: no rashes, lesions, ulcers. No induration Neurologic: CN 2-12 grossly intact. Sensation intact, DTR normal. Strength 5/5 in all 4.  Psychiatric: Unable to fully assess orientation as patient was uncooperative with answering questions.  Patient frequently mumbled profanity.  Patient also held up his middle finger when testing his upper  extremity strength.  Irritable mood but consolable with sister at bedside.    Labs on Admission: I have personally reviewed following labs and imaging studies  CBC: Recent Labs  Lab 07/17/19 1838  WBC 9.8  NEUTROABS 5.4  HGB 15.1  HCT 45.3  MCV 93.4  PLT 311   Basic Metabolic Panel: Recent Labs  Lab 07/17/19 1838  NA 138  K 3.1*  CL 101  CO2 24  GLUCOSE 142*  BUN 10  CREATININE 0.83  CALCIUM 9.0   GFR: Estimated Creatinine Clearance: 90.3 mL/min (by C-G formula based on SCr of 0.83 mg/dL). Liver Function Tests: Recent Labs  Lab 07/17/19 1838  AST 17  ALT 11  ALKPHOS 91  BILITOT 0.8  PROT 7.4  ALBUMIN 4.5   No results for input(s): LIPASE, AMYLASE in the last 168 hours. No results for input(s): AMMONIA in the last 168 hours. Coagulation Profile: No  results for input(s): INR, PROTIME in the last 168 hours. Cardiac Enzymes: No results for input(s): CKTOTAL, CKMB, CKMBINDEX, TROPONINI in the last 168 hours. BNP (last 3 results) No results for input(s): PROBNP in the last 8760 hours. HbA1C: No results for input(s): HGBA1C in the last 72 hours. CBG: No results for input(s): GLUCAP in the last 168 hours. Lipid Profile: No results for input(s): CHOL, HDL, LDLCALC, TRIG, CHOLHDL, LDLDIRECT in the last 72 hours. Thyroid Function Tests: No results for input(s): TSH, T4TOTAL, FREET4, T3FREE, THYROIDAB in the last 72 hours. Anemia Panel: No results for input(s): VITAMINB12, FOLATE, FERRITIN, TIBC, IRON, RETICCTPCT in the last 72 hours. Urine analysis:    Component Value Date/Time   COLORURINE YELLOW (A) 07/17/2019 2041   APPEARANCEUR CLEAR (A) 07/17/2019 2041   LABSPEC 1.014 07/17/2019 2041   PHURINE 6.0 07/17/2019 2041   GLUCOSEU NEGATIVE 07/17/2019 2041   HGBUR NEGATIVE 07/17/2019 2041   BILIRUBINUR NEGATIVE 07/17/2019 2041   KETONESUR NEGATIVE 07/17/2019 2041   PROTEINUR NEGATIVE 07/17/2019 2041   UROBILINOGEN 0.2 01/13/2013 1629   NITRITE NEGATIVE  07/17/2019 2041   LEUKOCYTESUR NEGATIVE 07/17/2019 2041    Radiological Exams on Admission: Ct Head Wo Contrast  Result Date: 07/17/2019 CLINICAL DATA:  Altered level of consciousness. EXAM: CT HEAD WITHOUT CONTRAST TECHNIQUE: Contiguous axial images were obtained from the base of the skull through the vertex without intravenous contrast. COMPARISON:  June 05, 2019. FINDINGS: Brain: Mild diffuse cortical atrophy is noted. Mild chronic ischemic white matter disease is noted. No mass effect or midline shift is noted. Ventricular size is within normal limits. There is no evidence of mass lesion, hemorrhage or acute infarction. Vascular: No hyperdense vessel or unexpected calcification. Skull: Normal. Negative for fracture or focal lesion. Sinuses/Orbits: No acute finding. Other: None. IMPRESSION: Mild diffuse cortical atrophy. Mild chronic ischemic white matter disease. No acute intracranial abnormality seen. Electronically Signed   By: Lupita Raider M.D.   On: 07/17/2019 19:14   Dg Chest Portable 1 View  Result Date: 07/17/2019 CLINICAL DATA:  Seizure EXAM: PORTABLE CHEST 1 VIEW COMPARISON:  June 07, 2018 FINDINGS: The heart size and mediastinal contours are within normal limits. Both lungs are clear. No acute osseous abnormality. IMPRESSION: No active disease. Electronically Signed   By: Jonna Clark M.D.   On: 07/17/2019 19:02    EKG: Independently reviewed.   Assessment/Plan Syncope -Syncope from a sitting position.  This has also occurred back in October and has had significant work-up with CT head, EEG, echocardiogram that were all negative.  Last episode thought likely to be bradycardia and had his beta-blocker discontinued. -admit to telemetry for monitoring -Will obtain carotid ultrasound as he is a heavy tobacco user and also gets weekly testosterone injections -Will consult cardiology and be discharged with long-term cardiac monitoring -Obtain orthostatic vital signs -Obtain  TSH  Hypokalemia -Potassium of 3.1 and admission -Will replete and recheck BMP  History of TBI - with mood disorder-pt at  baseline is irritable  Hypertension -continue amlodipine  COPD -stable-no exacerbation  Tobacco use -nicotine patch  DVT prophylaxis:.Lovenox Code Status: Full Family Communication: Plan discussed with patient and sister at bedside.  All questions and concerns addressed. disposition Plan: SNF with observation Consults called: Cardiology Admission status: Observation    Jacob Heeney T Lafayette Dunlevy DO [facility]   If 7PM-7AM, please contact night-coverage [email or web address] Password [facility]  07/17/2019, 9:17 PM

## 2019-07-17 NOTE — ED Provider Notes (Signed)
[facility] Emergency Department Provider Note   ____________________________________________   First MD Initiated Contact with Patient 07/17/19 1819     (approximate)  I have reviewed the triage vital signs and the nursing notes.   HISTORY  Chief Complaint Loss of Consciousness   HPI Jacob Villanueva is a 68 y.o. male who comes from the [facility].  He apparently passed out and may have had a seizure.  He just says he is pissed off here.  The nurse noted that he says that for every question he is asked.  He is still saying he is pissed off every time I asked him what happened.  He cannot tell me what happened.  He does follow commands very well.         Past Medical History:  Diagnosis Date  . Bacteremia 01/2017  . Hypertension   . TBI (traumatic brain injury) [facility])     Patient Active Problem List   Diagnosis Date Noted  . Hypokalemia 07/17/2019  . TBI (traumatic brain injury) (HCC) 07/17/2019  . History of COPD 07/17/2019  . Essential hypertension 07/17/2019  . Syncope 06/05/2019  . Fracture of femoral neck, left, closed (HCC) 06/07/2018  . Acute on chronic respiratory failure with hypoxia (HCC) 02/14/2017  . Sepsis (HCC) 02/09/2017  . Acute encephalopathy 01/19/2017    Past Surgical History:  Procedure Laterality Date  . APPENDECTOMY    . FRACTURE SURGERY    . INTRAMEDULLARY (IM) NAIL INTERTROCHANTERIC Left 06/07/2018   Procedure: INTRAMEDULLARY (IM) NAIL INTERTROCHANTRIC;  Surgeon: Kennedy Bucker, MD;  Location: [facility] ORS;  Service: Orthopedics;  Laterality: Left;    Prior to Admission medications   Medication Sig Start Date End Date Taking? Authorizing Provider  acetaminophen (TYLENOL) 500 MG tablet Take 1,000 mg by mouth 2 (two) times daily.    [provider]  amLODipine (NORVASC) 10 MG tablet Take 10 mg by mouth daily.    [provider]  loperamide (ANTI-DIARRHEAL) 2 MG tablet Take 1-2 tablets (2-4 mg total) by mouth as  needed for diarrhea or loose stools. Take 2 tablets after 1st loose stool, then 1 tablet for each episode after 06/06/19   Ihor Austin, MD  loratadine (CLARITIN) 10 MG tablet Take 10 mg by mouth daily as needed for allergies.    [provider]  testosterone cypionate (DEPOTESTOSTERONE CYPIONATE) 200 MG/ML injection Inject 100 mg into the muscle every Thursday. 05/30/19   [provider]  traZODone (DESYREL) 50 MG tablet Take 0.5 tablets (25 mg total) by mouth at bedtime as needed for sleep. Patient taking differently: Take 75 mg by mouth every evening.  06/10/18   Altamese Dilling, MD  vitamin B-12 (CYANOCOBALAMIN) 1000 MCG tablet Take 1,000 mcg by mouth daily.    [provider]    Allergies Bupropion, Effexor [venlafaxine hcl], Amoxicillin, and Clindamycin/lincomycin  No family history on file.  Social History Social History   Tobacco Use  . Smoking status: Current Every Day Smoker    Packs/day: 2.00  . Smokeless tobacco: Never Used  Substance Use Topics  . Alcohol use: Yes  . Drug use: No    Review of Systems  Constitutional: No fever/chills Eyes: No visual changes. ENT: No sore throat. Cardiovascular: Denies chest pain. Respiratory: Denies shortness of breath. Gastrointestinal: No abdominal pain.  No nausea, no vomiting.  No diarrhea.  No constipation. Genitourinary: Negative for dysuria. Musculoskeletal: Negative for back pain. Skin: Negative for rash. Neurological: Negative for headaches, focal weakness   ____________________________________________  PHYSICAL EXAM:  VITAL SIGNS: ED Triage Vitals  Enc Vitals Group     BP 07/17/19 1821 136/74     Pulse Rate 07/17/19 1821 61     Resp 07/17/19 1821 16     Temp 07/17/19 1821 97.8 F (36.6 C)     Temp Source 07/17/19 1821 Oral     SpO2 --      Weight 07/17/19 1834 170 lb (77.1 kg)     Height 07/17/19 1834 6\' 1"  (1.854 m)     Head Circumference --      Peak Flow --       Pain Score 07/17/19 1834 0     Pain Loc --      Pain Edu? --    Constitutional: Alert and oriented. Well appearing and in no acute distress. Eyes: Conjunctivae are normal.  Pupils are equal round.  Extraocular movements appear to be intact. Head: Atraumatic. Nose: No congestion/rhinnorhea. Mouth/Throat: Mucous membranes are moist.  Oropharynx non-erythematous. Neck: No stridor.  Cardiovascular: Normal rate, regular rhythm. Grossly normal heart sounds.  Good peripheral circulation. Respiratory: Normal respiratory effort.  No retractions. Lungs CTAB. Gastrointestinal: Soft and nontender. No distention. No abdominal bruits. No CVA tenderness. Musculoskeletal: No painful mass or tenderness in the legs no edema Neurologic:  Normal speech and language. No gross focal neurologic deficits are appreciated.  Cranial nerves II through XII are intact although visual fields were not checked.  Motor strength is 5/5 in the arms and the legs cerebellar finger-nose is normal patient does not report any numbness Skin:  Skin is warm, dry and intact. No rash noted. l.  ____________________________________________   LABS (all labs ordered are listed, but only abnormal results are displayed)  Labs Reviewed  COMPREHENSIVE METABOLIC PANEL - Abnormal; Notable for the following components:      Result Value   Potassium 3.1 (*)    Glucose, Bld 142 (*)    All other components within normal limits  URINALYSIS, COMPLETE (UACMP) WITH MICROSCOPIC - Abnormal; Notable for the following components:   Color, Urine YELLOW (*)    APPearance CLEAR (*)    All other components within normal limits  TROPONIN I (HIGH SENSITIVITY) - Abnormal; Notable for the following components:   Troponin I (High Sensitivity) 38 (*)    All other components within normal limits  SARS CORONAVIRUS 2 (TAT 6-24 HRS)  CBC WITH DIFFERENTIAL/PLATELET  URINE DRUG SCREEN, QUALITATIVE ([facility] ONLY)  HIV ANTIBODY (ROUTINE TESTING W REFLEX)  TSH   BASIC METABOLIC PANEL  TROPONIN I (HIGH SENSITIVITY)   ____________________________________________  EKG   ____________________________________________  RADIOLOGY  ED MD interpretation:    Official radiology report(s): Ct Head Wo Contrast  Result Date: 07/17/2019 CLINICAL DATA:  Altered level of consciousness. EXAM: CT HEAD WITHOUT CONTRAST TECHNIQUE: Contiguous axial images were obtained from the base of the skull through the vertex without intravenous contrast. COMPARISON:  June 05, 2019. FINDINGS: Brain: Mild diffuse cortical atrophy is noted. Mild chronic ischemic white matter disease is noted. No mass effect or midline shift is noted. Ventricular size is within normal limits. There is no evidence of mass lesion, hemorrhage or acute infarction. Vascular: No hyperdense vessel or unexpected calcification. Skull: Normal. Negative for fracture or focal lesion. Sinuses/Orbits: No acute finding. Other: None. IMPRESSION: Mild diffuse cortical atrophy. Mild chronic ischemic white matter disease. No acute intracranial abnormality seen. Electronically Signed   By: Marijo Conception M.D.   On: 07/17/2019 19:14   Dg Chest Portable 1 View  Result Date: 07/17/2019 CLINICAL DATA:  Seizure EXAM: PORTABLE CHEST 1 VIEW COMPARISON:  June 07, 2018 FINDINGS: The heart size and mediastinal contours are within normal limits. Both lungs are clear. No acute osseous abnormality. IMPRESSION: No active disease. Electronically Signed   By: Jonna ClarkBindu  Avutu M.D.   On: 07/17/2019 19:02    ____________________________________________   PROCEDURES  Procedure(s) performed (including Critical Care):  Procedures   ____________________________________________   INITIAL IMPRESSION / ASSESSMENT AND PLAN / ED COURSE  Unable to contact the [facility] to find out anything about how he presented.    Patient sister arrives.  She tells me the patient was sitting down when he passed out.  He turned white and pale and they  had difficulty telling if he was breathing or not.  He did something similar a month or 2 ago and was evaluated inpatient nothing was found except for low potassium.  Still I think it is worth checking him out again.  We will get him in the hospital. Jacob Villanueva was evaluated in Emergency Department on 07/17/2019 for the symptoms described in the history of present illness. He was evaluated in the context of the global COVID-19 pandemic, which necessitated consideration that the patient might be at risk for infection with the SARS-CoV-2 virus that causes COVID-19. Institutional protocols and algorithms that pertain to the evaluation of patients at risk for COVID-19 are in a state of rapid change based on information released by regulatory bodies including the CDC and federal and state organizations. These policies and algorithms were followed during the patient's care in the ED.         ____________________________________________   FINAL CLINICAL IMPRESSION(S) / ED DIAGNOSES  Final diagnoses:  Syncope     ED Discharge Orders    None       Note:  This document was prepared using Dragon voice recognition software and may include unintentional dictation errors.    Arnaldo NatalMalinda, Paul F, MD 07/17/19 [phone]

## 2019-07-17 NOTE — ED Triage Notes (Signed)
Pt here with c/o LOC this evening while outside at the [facility]. Staff on scene states pt "possibly had a seizure and possibly post ictal." Upon arrival, pt states he is "fuckin pissed off," and continues to be his response for every question asked. Denies any pain at this time. NAD.

## 2019-07-17 NOTE — ED Notes (Signed)
Pt given graham crackers and saltine crackers per request to eat. Sprite to drink given. Pt repositioned in bed.

## 2019-07-18 ENCOUNTER — Observation Stay: Payer: Medicare Other

## 2019-07-18 LAB — GLUCOSE, CAPILLARY: Glucose-Capillary: 111 mg/dL — ABNORMAL HIGH (ref 70–99)

## 2019-07-18 LAB — BASIC METABOLIC PANEL
Anion gap: 7 (ref 5–15)
BUN: 10 mg/dL (ref 8–23)
CO2: 24 mmol/L (ref 22–32)
Calcium: 8 mg/dL — ABNORMAL LOW (ref 8.9–10.3)
Chloride: 106 mmol/L (ref 98–111)
Creatinine, Ser: 0.76 mg/dL (ref 0.61–1.24)
GFR calc Af Amer: >60 mL/min (ref >60)
GFR calc non Af Amer: >60 mL/min (ref >60)
Glucose, Bld: 110 mg/dL — ABNORMAL HIGH (ref 70–99)
Potassium: 3.4 mmol/L — ABNORMAL LOW (ref 3.5–5.1)
Sodium: 137 mmol/L (ref 135–145)

## 2019-07-18 LAB — TSH: TSH: 1.798 u[IU]/mL (ref 0.350–4.500)

## 2019-07-18 LAB — SARS CORONAVIRUS 2 (TAT 6-24 HRS): SARS Coronavirus 2: NEGATIVE

## 2019-07-18 LAB — TROPONIN I (HIGH SENSITIVITY): Troponin I (High Sensitivity): 30 ng/L — ABNORMAL HIGH (ref <18)

## 2019-07-18 MED ORDER — SODIUM CHLORIDE 0.9 % IV BOLUS
1000.0000 mL | Freq: Once | INTRAVENOUS | Status: AC
  Administered 2019-07-18: 1000 mL via INTRAVENOUS

## 2019-07-18 MED ORDER — NICOTINE 21 MG/24HR TD PT24: 21.0000 mg | MEDICATED_PATCH | Freq: Every day | TRANSDERMAL | 0 refills | Status: AC

## 2019-07-18 NOTE — TOC Initial Note (Signed)
Transition of Care [facility]) - Initial/Assessment Note    Patient Details  Name: Jacob Villanueva MRN: [id number] Date of Birth: Jan 15, 1951  [facility] of [facility]) CM/SW Contact:    Anselm Pancoast, RN Phone Number: 07/18/2019, 12:21 PM  Clinical Narrative:                 Notified by MD need for PT to be initiated at discharge. Patient is having increased agitation and wanting to discharge home quickly. RN CM talked with sister at bedside who states patient lives at [facility] and they had talked with "Rachel Bo" at [facility] about starting physical therapy in the past. RN CM will attempt to schedule home health.   Expected Discharge Plan: Assisted Living     Patient Goals and CMS Choice Patient states their goals for this hospitalization and ongoing recovery are:: return back to ALF with some physical therapy      Expected Discharge Plan and Services Expected Discharge Plan: Assisted Living       Living arrangements for the past 2 months: [place]                                      Prior Living Arrangements/Services Living arrangements for the past 2 months: [place] Lives with:: Self Patient language and need for interpreter reviewed:: Yes Do you feel safe going back to the place where you live?: Yes      Need for Family Participation in Patient Care: Yes (Comment) Care giver support system in place?: Yes (comment)   Criminal Activity/Legal Involvement Pertinent to Current Situation/Hospitalization: No - Comment as needed  Activities of Daily Living      Permission Sought/Granted Permission sought to share information with : Chartered certified accountant granted to share information with : Yes, Verbal Permission Granted  Share Information with NAME: Dustin @ [facility]           Emotional Assessment Appearance:: Appears stated age Attitude/Demeanor/Rapport: Aggressive (Verbally and/or physically) Affect (typically  observed): Agitated Orientation: : Oriented to Self, Oriented to Place, Oriented to  Time, Oriented to Situation(History of TBI) Alcohol / Substance Use: Alcohol Use, Tobacco Use Psych Involvement: No (comment)  Admission diagnosis:  poss seizure Patient Active Problem List   Diagnosis Date Noted  . Hypokalemia 07/17/2019  . TBI (traumatic brain injury) ([place]) 07/17/2019  . History of COPD 07/17/2019  . Essential hypertension 07/17/2019  . Syncope 06/05/2019  . Fracture of femoral neck, left, closed ([place]) 06/07/2018  . Acute on chronic respiratory failure with hypoxia ([place]) 02/14/2017  . Sepsis ([place]) 02/09/2017  . Acute encephalopathy 01/19/2017   PCP:  [facility]:   [place], [place] - [place] [phone] [place] [address] Phone: [phone] Fax: [phone]     Social Determinants of Health (SDOH) Interventions    Readmission Risk Interventions No flowsheet data found.

## 2019-07-18 NOTE — Care Management (Signed)
RN CM: Discussed with sister, Pam, who selected [facility] for continued PT services at ALF. RN CM notified [facility] and confirmed services.

## 2019-07-18 NOTE — ED Notes (Signed)
Pt given breakfast tray at this time. VSS and NAD noted. No further needs. Will continue to monitor.

## 2019-07-18 NOTE — Care Management (Addendum)
RN CM: LVMM for Dustin, Scientist, physiological, advising patient was preparing to return to ALF and would need physical therapy. Patient's sister states they had discussed potential need for therapy in the past and Rachel Bo had advised them he could set that up.   Incoming call from [place] @ ALF-states PT is provided by [facility] at this facility. New orders at discharge can be faxed to ALF @ [phone].

## 2019-07-25 ENCOUNTER — Ambulatory Visit: Payer: Medicare Other | Admitting: Cardiology

## 2019-07-29 ENCOUNTER — Encounter: Payer: Self-pay | Admitting: Cardiology

## 2019-07-29 ENCOUNTER — Ambulatory Visit (INDEPENDENT_AMBULATORY_CARE_PROVIDER_SITE_OTHER): Payer: Medicare Other | Admitting: Cardiology

## 2019-07-29 ENCOUNTER — Other Ambulatory Visit: Payer: Self-pay

## 2019-07-29 ENCOUNTER — Ambulatory Visit (INDEPENDENT_AMBULATORY_CARE_PROVIDER_SITE_OTHER): Payer: Medicare Other

## 2019-07-29 VITALS — BP 157/98 | HR 87 | Temp 98.6°F | Ht 72.0 in | Wt 199.0 lb

## 2019-07-29 DIAGNOSIS — R55 Syncope and collapse: Secondary | ICD-10-CM
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
## 2019-07-29 NOTE — Patient Instructions (Addendum)
Medication Instructions:  Your physician recommends that you continue on your current medications as directed. Please refer to the Current Medication list given to you today.  *If you need a refill on your cardiac medications before your next appointment, please call your pharmacy*  Lab Work: None ordered If you have labs (blood work) drawn today and your tests are completely normal, you will receive your results only by: Marland Kitchen MyChart Message (if you have MyChart) OR . A paper copy in the mail If you have any lab test that is abnormal or we need to change your treatment, we will call you to review the results.  Testing/Procedures: Your physician has recommended that you wear an zio monitor.Zio monitors are medical devices that record the heart's electrical activity. Doctors most often [place] these monitors to diagnose arrhythmias. Arrhythmias are problems with the speed or rhythm of the heartbeat. The monitor is a small, portable device. You can wear one while you do your normal daily activities. This is usually used to diagnose what is causing palpitations/syncope (passing out). (To be worn for 14 days, the mailed back in the box provided)   Follow-Up: At [facility], you and your health needs are our priority.  As part of our continuing mission to provide you with exceptional heart care, we have created designated Provider Care Teams.  These Care Teams include your primary Cardiologist (physician) and Advanced Practice Providers (APPs -  Physician Assistants and Nurse Practitioners) who all work together to provide you with the care you need, when you need it.  Your next appointment:   4 week(s)  The format for your next appointment:   In Person  Provider:    You may see  Dr. Azucena Cecil or one of the following Advanced Practice Providers on your designated Care Team:    Nicolasa Ducking, NP  Eula Listen, PA-C  Marisue Ivan, PA-C   Other Instructions Your physician has  recommended that you wear a Zio monitor. This monitor is a medical device that records the heart's electrical activity. Doctors most often use these monitors to diagnose arrhythmias. Arrhythmias are problems with the speed or rhythm of the heartbeat. The monitor is a small device applied to your chest. You can wear one while you do your normal daily activities. While wearing this monitor if you have any symptoms to push the button and record what you felt. Once you have worn this monitor for the period of time provider prescribed (Usually 14 days), you will return the monitor device in the postage paid box. Once it is returned they will download the data collected and provide [place] with a report which the provider will then review and we will call you with those results. Important tips:  1. Avoid showering during the first 24 hours of wearing the monitor. 2. Avoid excessive sweating to help maximize wear time. 3. Do not submerge the device, no hot tubs, and no swimming pools. 4. Keep any lotions or oils away from the patch. 5. After 24 hours you may shower with the patch on. Take brief showers with your back facing the shower head.  6. Do not remove patch once it has been placed because that will interrupt data and decrease adhesive wear time. 7. Push the button when you have any symptoms and write down what you were feeling. 8. Once you have completed wearing your monitor, remove and place into box which has postage paid and place in your outgoing mailbox.  9. If for some reason  you have misplaced your box then call our office and we can provide another box and/or mail it off for you.

## 2019-07-30 ENCOUNTER — Telehealth: Payer: Self-pay | Admitting: Cardiology

## 2019-07-30 NOTE — Telephone Encounter (Signed)
Attempted to schedule 4 week fu zio per chkout 07/29/19 .   No ans

## 2019-08-12 ENCOUNTER — Other Ambulatory Visit: Payer: Self-pay

## 2019-08-12 ENCOUNTER — Emergency Department
Admission: EM | Admit: 2019-08-12 | Discharge: 2019-08-12 | Disposition: A | Discharge status: Home / Self Care | Payer: Medicare Other | Attending: Student in an Organized Health Care Education/Training Program | Admitting: Student in an Organized Health Care Education/Training Program

## 2019-08-12 ENCOUNTER — Emergency Department: Payer: Medicare Other

## 2019-08-12 DIAGNOSIS — I10 Essential (primary) hypertension: Secondary | ICD-10-CM | POA: Insufficient documentation

## 2019-08-12 DIAGNOSIS — Z79899 Other long term (current) drug therapy: Secondary | ICD-10-CM | POA: Insufficient documentation

## 2019-08-12 DIAGNOSIS — R55 Syncope and collapse: Secondary | ICD-10-CM

## 2019-08-12 DIAGNOSIS — F17210 Nicotine dependence, cigarettes, uncomplicated: Secondary | ICD-10-CM | POA: Insufficient documentation

## 2019-08-12 LAB — CBC
HCT: 40.2 % (ref 39.0–52.0)
Hemoglobin: 14 g/dL (ref 13.0–17.0)
MCH: 31.5 pg (ref 26.0–34.0)
MCHC: 34.8 g/dL (ref 30.0–36.0)
MCV: 90.3 fL (ref 80.0–100.0)
Platelets: 299 10*3/uL (ref 150–400)
RBC: 4.45 MIL/uL (ref 4.22–5.81)
RDW: 12.7 % (ref 11.5–15.5)
WBC: 8.4 10*3/uL (ref 4.0–10.5)
nRBC: 0 % (ref 0.0–0.2)

## 2019-08-12 LAB — COMPREHENSIVE METABOLIC PANEL
ALT: 11 U/L (ref 0–44)
AST: 19 U/L (ref 15–41)
Albumin: 4.3 g/dL (ref 3.5–5.0)
Alkaline Phosphatase: 81 U/L (ref 38–126)
Anion gap: 11 (ref 5–15)
BUN: 12 mg/dL (ref 8–23)
CO2: 24 mmol/L (ref 22–32)
Calcium: 8.7 mg/dL — ABNORMAL LOW (ref 8.9–10.3)
Chloride: 105 mmol/L (ref 98–111)
Creatinine, Ser: 1.31 mg/dL — ABNORMAL HIGH (ref 0.61–1.24)
GFR calc Af Amer: >60 mL/min (ref >60)
GFR calc non Af Amer: 55 mL/min — ABNORMAL LOW (ref >60)
Glucose, Bld: 169 mg/dL — ABNORMAL HIGH (ref 70–99)
Potassium: 3.4 mmol/L — ABNORMAL LOW (ref 3.5–5.1)
Sodium: 140 mmol/L (ref 135–145)
Total Bilirubin: 0.5 mg/dL (ref 0.3–1.2)
Total Protein: 7.1 g/dL (ref 6.5–8.1)

## 2019-08-12 NOTE — ED Notes (Signed)
This Probation officer called and told his POA (sister) that p[atient is ready to be discharge and he needs a ride. Patient's  Sister said that she will come and pick up patient in 30 minutes. No issues.

## 2019-08-12 NOTE — ED Notes (Signed)
Patient is calm and cooperative. He is stable in NAD. Discharged to [facility] living via sister [place]. Discharge instruction given. No issues.

## 2019-08-12 NOTE — ED Notes (Signed)
Hourly rounding reveals patient in room. No complaints, stable, in no acute distress. Q15 minute rounds and monitoring via Rover and Officer to continue.   

## 2019-08-12 NOTE — ED Notes (Addendum)
Spoke with his sister - POA  She reports that staff reported to her that the pt slumped over outside and that it took him awhile to recover  Sister also reports that he has mark on the side of his head  Pt with a TBI in 2009  Pt sister insisting on visiting with him at this time  - I attempted to explain visitor restrictions due to covid and pt is on the behavioral hallway and if visitors were allowed it is outside of the visiting time frame   Sister is insisting on coming in to his bedside

## 2019-08-12 NOTE — ED Triage Notes (Signed)
He arrives today via [facility] from [employer] reports that he was outside smoking a cigarette when he slumped over  - EMS reports that when they arrived on the scene the pt was in his room and was verbally aggressive with EMS staff   He is alert upon arrival

## 2019-08-12 NOTE — ED Notes (Signed)
Report to include Situation, Background, Assessment, and Recommendations received from Amy RN. Patient alert and oriented, warm and dry, in no acute distress. Patient denies SI, HI, AVH and pain. Patient made aware of Q15 minute rounds and Rover and Officer presence for their safety. Patient instructed to come to me with needs or concerns.   

## 2019-08-12 NOTE — ED Provider Notes (Signed)
[facility] Emergency Department Provider Note    First MD Initiated Contact with Patient 08/12/19 1836     (approximate)  I have reviewed the triage vital signs and the nursing notes.   HISTORY  Chief Complaint "I'm fine"   HPI Jacob Villanueva is a 68 y.o. male below his past medical history presents to the ER via EMS after near syncopal event.  Reportedly had a fainting spell while he was smoking.  Patient denies this.  EMS reported that when they found him he was arguing with staff.  Patient currently calm and cooperative.  Denies any pain.  Had similar presentation earlier this month with reassuring work-up.  Patient denies any nausea vomiting.  No chest pain or shortness of breath.  No fevers.  No complaints.    Past Medical History:  Diagnosis Date  . Acute encephalopathy   . Alcohol abuse   . Anemia   . Bacteremia 01/2017  . History of COPD   . Hypertension   . Hypokalemia   . Syncope and collapse   . TBI (traumatic brain injury) (HCC)   . Tobacco abuse    Family History  Problem Relation Age of Onset  . Hypertension Mother   . Hyperlipidemia Mother   . Stroke Mother   . Stomach cancer Father   . Hypertension Sister   . Hyperlipidemia Sister   . Heart disease Maternal Grandmother   . Hypertension Maternal Grandmother   . Hyperlipidemia Maternal Grandmother    Past Surgical History:  Procedure Laterality Date  . APPENDECTOMY    . FRACTURE SURGERY    . INTRAMEDULLARY (IM) NAIL INTERTROCHANTERIC Left 06/07/2018   Procedure: INTRAMEDULLARY (IM) NAIL INTERTROCHANTRIC;  Surgeon: Kennedy Bucker, MD;  Location: [facility] ORS;  Service: Orthopedics;  Laterality: Left;   Patient Active Problem List   Diagnosis Date Noted  . Hypokalemia 07/17/2019  . TBI (traumatic brain injury) (HCC) 07/17/2019  . History of COPD 07/17/2019  . Essential hypertension 07/17/2019  . Syncope 06/05/2019  . Fracture of femoral neck, left, closed (HCC) 06/07/2018  .  Acute on chronic respiratory failure with hypoxia (HCC) 02/14/2017  . Sepsis (HCC) 02/09/2017  . Acute encephalopathy 01/19/2017      Prior to Admission medications   Medication Sig Start Date End Date Taking? Authorizing Provider  acetaminophen (TYLENOL) 500 MG tablet Take 1,000 mg by mouth 2 (two) times daily.    [provider]  amLODipine (NORVASC) 10 MG tablet Take 10 mg by mouth daily.    [provider]  loperamide (ANTI-DIARRHEAL) 2 MG tablet Take 1-2 tablets (2-4 mg total) by mouth as needed for diarrhea or loose stools. Take 2 tablets after 1st loose stool, then 1 tablet for each episode after 06/06/19   Ihor Austin, MD  loratadine (CLARITIN) 10 MG tablet Take 10 mg by mouth daily as needed for allergies.    [provider]  nicotine (NICODERM CQ - DOSED IN MG/24 HOURS) 21 mg/24hr patch Place 1 patch (21 mg total) onto the skin daily. 07/19/19   Rolly Salter, MD  testosterone cypionate (DEPOTESTOSTERONE CYPIONATE) 200 MG/ML injection Inject 100 mg into the muscle every Thursday. 05/30/19   [provider]  traZODone (DESYREL) 50 MG tablet Take 0.5 tablets (25 mg total) by mouth at bedtime as needed for sleep. Patient taking differently: Take 75 mg by mouth every evening.  06/10/18   Altamese Dilling, MD  vitamin B-12 (CYANOCOBALAMIN) 1000 MCG tablet Take 1,000 mcg by mouth daily.  [provider]    Allergies Bupropion, Effexor [venlafaxine hcl], Amoxicillin, and Clindamycin/lincomycin    Social History Social History   Tobacco Use  . Smoking status: Current Every Day Smoker    Packs/day: 2.00    Years: 25.00    Pack years: 50.00  . Smokeless tobacco: Never Used  Substance Use Topics  . Alcohol use: Yes  . Drug use: No    Review of Systems Patient denies headaches, rhinorrhea, blurry vision, numbness, shortness of breath, chest pain, edema, cough, abdominal pain, nausea, vomiting, diarrhea, dysuria, fevers,  rashes or hallucinations unless otherwise stated above in HPI. ____________________________________________   PHYSICAL EXAM:  VITAL SIGNS: Vitals:   08/12/19 1956 08/12/19 1957  BP: 112/71 124/85  Pulse: 70 69  Resp: 18 18  Temp: 98.5 F (36.9 C) 98.5 F (36.9 C)  SpO2: 95% 95%    Constitutional: Alert and oriented.  Eyes: Conjunctivae are normal.  Head: Atraumatic. Nose: No congestion/rhinnorhea. Mouth/Throat: Mucous membranes are moist.   Neck: No stridor. Painless ROM.  Cardiovascular: Normal rate, regular rhythm. Grossly normal heart sounds.  Good peripheral circulation. Respiratory: Normal respiratory effort.  No retractions. Lungs CTAB. Gastrointestinal: Soft and nontender. No distention. No abdominal bruits. No CVA tenderness. Genitourinary:  Musculoskeletal: No lower extremity tenderness nor edema.  No joint effusions. Neurologic:  Normal speech and language. No gross focal neurologic deficits are appreciated. No facial droop Skin:  Skin is warm, dry and intact. No rash noted. Psychiatric: Mood and affect are normal. Speech and behavior are normal.  ____________________________________________   LABS (all labs ordered are listed, but only abnormal results are displayed)  Results for orders placed or performed during the hospital encounter of 08/12/19 (from the past 24 hour(s))  Comprehensive metabolic panel     Status: Abnormal   Collection Time: 08/12/19  6:45 PM  Result Value Ref Range   Sodium 140 135 - 145 mmol/L   Potassium 3.4 (L) 3.5 - 5.1 mmol/L   Chloride 105 98 - 111 mmol/L   CO2 24 22 - 32 mmol/L   Glucose, Bld 169 (H) 70 - 99 mg/dL   BUN 12 8 - 23 mg/dL   Creatinine, Ser 1.321.31 (H) 0.61 - 1.24 mg/dL   Calcium 8.7 (L) 8.9 - 10.3 mg/dL   Total Protein 7.1 6.5 - 8.1 g/dL   Albumin 4.3 3.5 - 5.0 g/dL   AST 19 15 - 41 U/L   ALT 11 0 - 44 U/L   Alkaline Phosphatase 81 38 - 126 U/L   Total Bilirubin 0.5 0.3 - 1.2 mg/dL   GFR calc non Af Amer 55 (L)  >60 mL/min   GFR calc Af Amer >60 >60 mL/min   Anion gap 11 5 - 15  cbc     Status: None   Collection Time: 08/12/19  6:45 PM  Result Value Ref Range   WBC 8.4 4.0 - 10.5 K/uL   RBC 4.45 4.22 - 5.81 MIL/uL   Hemoglobin 14.0 13.0 - 17.0 g/dL   HCT 44.040.2 10.239.0 - 72.552.0 %   MCV 90.3 80.0 - 100.0 fL   MCH 31.5 26.0 - 34.0 pg   MCHC 34.8 30.0 - 36.0 g/dL   RDW 36.612.7 44.011.5 - 34.715.5 %   Platelets 299 150 - 400 K/uL   nRBC 0.0 0.0 - 0.2 %   ____________________________________________  EKG My review and personal interpretation at Time: 19:00   Indication: near syncope  Rate: 75  Rhythm: sinus Axis: normal Other: normal intervals,  no stemi ____________________________________________  RADIOLOGY  I personally reviewed all radiographic images ordered to evaluate for the above acute complaints and reviewed radiology reports and findings.  These findings were personally discussed with the patient.  Please see medical record for radiology report.  ____________________________________________   PROCEDURES  Procedure(s) performed:  Procedures    Critical Care performed: no ____________________________________________   INITIAL IMPRESSION / ASSESSMENT AND PLAN / ED COURSE  Pertinent labs & imaging results that were available during my care of the patient were reviewed by me and considered in my medical decision making (see chart for details).   DDX: Dehydration, electrolyte abnormality, orthostasis, seizure, CVA, TIA, vasovagal, dysrhythmia  Kalem Rockwell is a 68 y.o. who presents to the ED with symptoms as described above.  Patient currently asymptomatic.  Had recent presentation admission the hospital for similar symptoms with reassuring work-up.  Blood work is reassuring.  CT imaging without evidence of acute abnormality.  Neuro exam is reassuring.  EKG without any evidence of preexcitation syndrome or dysrhythmia.  He is not having any chest pain or pressure.  At this point I believe he  is appropriate for outpatient follow-up.     The patient was evaluated in Emergency Department today for the symptoms described in the history of present illness. He/she was evaluated in the context of the global COVID-19 pandemic, which necessitated consideration that the patient might be at risk for infection with the SARS-CoV-2 virus that causes COVID-19. Institutional protocols and algorithms that pertain to the evaluation of patients at risk for COVID-19 are in a state of rapid change based on information released by regulatory bodies including the CDC and federal and state organizations. These policies and algorithms were followed during the patient's care in the ED.  As part of my medical decision making, I reviewed the following data within the [place] notes reviewed and incorporated, Labs reviewed, notes from prior ED visits and [place] Controlled Substance Database   ____________________________________________   FINAL CLINICAL IMPRESSION(S) / ED DIAGNOSES  Final diagnoses:  Near syncope      NEW MEDICATIONS STARTED DURING THIS VISIT:  Discharge Medication List as of 08/12/2019  8:55 PM       Note:  This document was prepared using Dragon voice recognition software and may include unintentional dictation errors.    Merlyn Lot, MD 08/12/19 [phone]

## 2019-08-15 NOTE — Telephone Encounter (Signed)
Lmov to schedule  

## 2019-08-30 ENCOUNTER — Telehealth: Payer: Self-pay

## 2019-08-30 NOTE — Telephone Encounter (Signed)
Called to give the pt zio monitor results. lmtcb.

## 2019-08-30 NOTE — Telephone Encounter (Signed)
-----   Message from Debbe Odea, MD sent at 08/30/2019 12:18 PM EST ----- No significant arrhythmias to explain patient's symptoms of syncope

## 2019-09-02 ENCOUNTER — Encounter: Payer: Self-pay | Admitting: *Deleted

## 2019-09-02 NOTE — Telephone Encounter (Signed)
Results called to patient sister, who is POA. She verbalized understanding of the results. Patient has hx traumatic brain injury and resides at nursing home. She will continue to have PCP ([facility] monitor the patient and let us know if she feels he needs f/u appointment. Patient's facility currently has multiple numbers of COVID circulating.

## 2019-09-18 NOTE — Telephone Encounter (Signed)
Patients sister called in to state that patient is unable to complete f/u visit at this time. Patients facility is locked down for covid and sister believes a virtual visit would not work well. Sister will call back when able to schedule

## 2019-09-18 NOTE — Telephone Encounter (Signed)
Attempted to schedule.  LMOV to call office.  ° °

## 2019-12-16 ENCOUNTER — Ambulatory Visit: Payer: Medicare Other | Admitting: Cardiology

## 2020-02-08 ENCOUNTER — Emergency Department
Admission: EM | Admit: 2020-02-08 | Discharge: 2020-02-08 | Disposition: A | Discharge status: Home / Self Care | Payer: Medicare Other | Attending: Emergency Medicine | Admitting: Emergency Medicine

## 2020-02-08 ENCOUNTER — Other Ambulatory Visit: Payer: Self-pay

## 2020-02-08 ENCOUNTER — Emergency Department: Payer: Medicare Other

## 2020-02-08 DIAGNOSIS — I10 Essential (primary) hypertension: Secondary | ICD-10-CM | POA: Insufficient documentation

## 2020-02-08 DIAGNOSIS — S0990XA Unspecified injury of head, initial encounter: Secondary | ICD-10-CM | POA: Insufficient documentation

## 2020-02-08 DIAGNOSIS — W19XXXA Unspecified fall, initial encounter: Secondary | ICD-10-CM

## 2020-02-08 DIAGNOSIS — Y929 Unspecified place or not applicable: Secondary | ICD-10-CM | POA: Insufficient documentation

## 2020-02-08 DIAGNOSIS — Y939 Activity, unspecified: Secondary | ICD-10-CM | POA: Diagnosis not present

## 2020-02-08 DIAGNOSIS — W01198A Fall on same level from slipping, tripping and stumbling with subsequent striking against other object, initial encounter: Secondary | ICD-10-CM | POA: Diagnosis not present

## 2020-02-08 DIAGNOSIS — Y999 Unspecified external cause status: Secondary | ICD-10-CM | POA: Insufficient documentation

## 2020-02-08 LAB — CBC WITH DIFFERENTIAL/PLATELET
Abs Immature Granulocytes: 0.03 10*3/uL (ref 0.00–0.07)
Basophils Absolute: 0.1 10*3/uL (ref 0.0–0.1)
Basophils Relative: 1 %
Eosinophils Absolute: 0.2 10*3/uL (ref 0.0–0.5)
Eosinophils Relative: 2 %
HCT: 41.4 % (ref 39.0–52.0)
Hemoglobin: 14.4 g/dL (ref 13.0–17.0)
Immature Granulocytes: 0 %
Lymphocytes Relative: 21 %
Lymphs Abs: 1.6 10*3/uL (ref 0.7–4.0)
MCH: 32.4 pg (ref 26.0–34.0)
MCHC: 34.8 g/dL (ref 30.0–36.0)
MCV: 93.2 fL (ref 80.0–100.0)
Monocytes Absolute: 0.5 10*3/uL (ref 0.1–1.0)
Monocytes Relative: 7 %
Neutro Abs: 5.3 10*3/uL (ref 1.7–7.7)
Neutrophils Relative %: 69 %
Platelets: 253 10*3/uL (ref 150–400)
RBC: 4.44 MIL/uL (ref 4.22–5.81)
RDW: 12.1 % (ref 11.5–15.5)
WBC: 7.7 10*3/uL (ref 4.0–10.5)
nRBC: 0 % (ref 0.0–0.2)

## 2020-02-08 LAB — COMPREHENSIVE METABOLIC PANEL
ALT: 10 U/L (ref 0–44)
AST: 16 U/L (ref 15–41)
Albumin: 4.6 g/dL (ref 3.5–5.0)
Alkaline Phosphatase: 67 U/L (ref 38–126)
Anion gap: 11 (ref 5–15)
BUN: 7 mg/dL — ABNORMAL LOW (ref 8–23)
CO2: 25 mmol/L (ref 22–32)
Calcium: 8.7 mg/dL — ABNORMAL LOW (ref 8.9–10.3)
Chloride: 103 mmol/L (ref 98–111)
Creatinine, Ser: 0.91 mg/dL (ref 0.61–1.24)
GFR calc Af Amer: >60 mL/min (ref >60)
GFR calc non Af Amer: >60 mL/min (ref >60)
Glucose, Bld: 114 mg/dL — ABNORMAL HIGH (ref 70–99)
Potassium: 2.9 mmol/L — ABNORMAL LOW (ref 3.5–5.1)
Sodium: 139 mmol/L (ref 135–145)
Total Bilirubin: 0.9 mg/dL (ref 0.3–1.2)
Total Protein: 7 g/dL (ref 6.5–8.1)

## 2020-02-08 NOTE — ED Notes (Signed)
EMS arrived to take patient back to facility

## 2020-02-08 NOTE — ED Triage Notes (Signed)
Pt comes EMS from [facility] after falling against a wall and hitting his head. Pt urinated on himself when he fell. Pt has had previous TBI. Pt does not c/o anything at this time and wants to go back to the [facility] for supper.

## 2020-02-08 NOTE — ED Notes (Signed)
Reviewed discharge instructions, follow-up care, and prescriptions with patient and facility rep. Patient and facility rep verbalized understanding of all information reviewed. Patient stable, with no distress noted at this time.

## 2020-02-08 NOTE — ED Provider Notes (Signed)
ER Provider Note       Time seen: 5:31 PM   Level V caveat: History/ROS limited by altered mental status I have reviewed the vital signs and the nursing notes.  HISTORY   Chief Complaint Fall    HPI Jacob Villanueva is a 69 y.o. male with a history of encephalopathy, alcohol abuse, anemia, hypertension, syncope, TBI who presents today for a fall.  Patient fell against a wall and hit his head.  He urinated on himself when he fell.  Has a previous history of TBI.  He does not complain of anything at this time, wants to go back and have dinner at his facility.  Past Medical History:  Diagnosis Date   Acute encephalopathy    Alcohol abuse    Anemia    Bacteremia 01/2017   History of COPD    Hypertension    Hypokalemia    Syncope and collapse    TBI (traumatic brain injury) ([place])    Tobacco abuse     Past Surgical History:  Procedure Laterality Date   APPENDECTOMY     FRACTURE SURGERY     INTRAMEDULLARY (IM) NAIL INTERTROCHANTERIC Left 06/07/2018   Procedure: INTRAMEDULLARY (IM) NAIL INTERTROCHANTRIC;  Surgeon: Hessie Knows, MD;  Location: [facility] ORS;  Service: Orthopedics;  Laterality: Left;    Allergies Bupropion, Effexor [venlafaxine hcl], Amoxicillin, and Clindamycin/lincomycin  Review of Systems Unknown, patient cannot give review of systems or report due to history of TBI and encephalopathy  All systems negative/normal/unremarkable except as stated in the HPI  ____________________________________________   PHYSICAL EXAM:  VITAL SIGNS: Vitals:   02/08/20 1718  BP: 135/81  Pulse: 66  Resp: 18  Temp: 99.1 F (37.3 C)  SpO2: 96%    Constitutional: Alert but disoriented, no acute distress.  Patient has urinated on himself Eyes: Conjunctivae are normal. Normal extraocular movements. ENT      Head: Normocephalic and atraumatic.      Nose: No congestion/rhinnorhea.      Mouth/Throat: Mucous membranes are moist.      Neck: No  stridor. Cardiovascular: Normal rate, regular rhythm. No murmurs, rubs, or gallops. Respiratory: Normal respiratory effort without tachypnea nor retractions. Breath sounds are clear and equal bilaterally. No wheezes/rales/rhonchi. Gastrointestinal: Soft and nontender. Normal bowel sounds Musculoskeletal: Nontender with normal range of motion in extremities. No lower extremity tenderness nor edema. Neurologic:  Normal speech and language. No gross focal neurologic deficits are appreciated.  Skin:  Skin is warm, dry and intact. No rash noted. Psychiatric: Hostile mood and affect at times ____________________________________________  EKG: Interpreted by me.  Sinus rhythm with rate of 64 bpm, possible septal infarct age-indeterminate, leftward axis, normal QT  ____________________________________________   LABS (pertinent positives/negatives)  Labs Reviewed  COMPREHENSIVE METABOLIC PANEL - Abnormal; Notable for the following components:      Result Value   Potassium 2.9 (*)    Glucose, Bld 114 (*)    BUN 7 (*)    Calcium 8.7 (*)    All other components within normal limits  CBC WITH DIFFERENTIAL/PLATELET  URINALYSIS, COMPLETE (UACMP) WITH MICROSCOPIC    RADIOLOGY  Images were viewed by me CT head IMPRESSION: Atrophy, chronic microvascular disease.  No acute intracranial abnormality.  DIFFERENTIAL DIAGNOSIS  Fall, minor head injury, subdural, subarachnoid, dehydration, electrolyte abnormality, arrhythmia  ASSESSMENT AND PLAN  Fall, head injury   Plan: The patient had presented for a fall. Patient's labs did not reveal any acute process.  Patient was refusing any further treatment and  becoming increasingly agitated.  He is cleared for return back to his facility.  Daryel November MD    Note: This note was generated in part or whole with voice recognition software. Voice recognition is usually quite accurate but there are transcription errors that can and very often do  occur. I apologize for any typographical errors that were not detected and corrected.     Emily Filbert, MD 02/08/20 [phone]

## 2020-11-13 DEATH — deceased
